# Patient Record
Sex: Male | Born: 1945 | Race: White | Hispanic: No | Marital: Married | Smoking: Former smoker
Health system: Southern US, Community
[De-identification: ages and names within clinical notes are randomized; demographics above are authoritative.]

## PROBLEM LIST (undated history)

## (undated) DIAGNOSIS — C801 Malignant (primary) neoplasm, unspecified: Secondary | ICD-10-CM

## (undated) DIAGNOSIS — G4730 Sleep apnea, unspecified: Secondary | ICD-10-CM

## (undated) DIAGNOSIS — J90 Pleural effusion, not elsewhere classified: Secondary | ICD-10-CM

## (undated) DIAGNOSIS — I219 Acute myocardial infarction, unspecified: Secondary | ICD-10-CM

## (undated) DIAGNOSIS — N400 Enlarged prostate without lower urinary tract symptoms: Secondary | ICD-10-CM

## (undated) DIAGNOSIS — M1990 Unspecified osteoarthritis, unspecified site: Secondary | ICD-10-CM

## (undated) DIAGNOSIS — I10 Essential (primary) hypertension: Secondary | ICD-10-CM

## (undated) DIAGNOSIS — K219 Gastro-esophageal reflux disease without esophagitis: Secondary | ICD-10-CM

## (undated) DIAGNOSIS — I4891 Unspecified atrial fibrillation: Secondary | ICD-10-CM

## (undated) DIAGNOSIS — I2510 Atherosclerotic heart disease of native coronary artery without angina pectoris: Secondary | ICD-10-CM

## (undated) HISTORY — PX: OTHER SURGICAL HISTORY: SHX169

---

## 2000-09-09 ENCOUNTER — Ambulatory Visit (HOSPITAL_COMMUNITY): Admission: RE | Admit: 2000-09-09 | Discharge: 2000-09-09 | Payer: Self-pay | Admitting: Internal Medicine

## 2000-09-09 ENCOUNTER — Encounter: Payer: Self-pay | Admitting: Internal Medicine

## 2005-04-14 ENCOUNTER — Other Ambulatory Visit: Admission: RE | Admit: 2005-04-14 | Discharge: 2005-04-14 | Payer: Self-pay | Admitting: Urology

## 2005-06-25 ENCOUNTER — Ambulatory Visit: Payer: Self-pay | Admitting: Internal Medicine

## 2005-06-25 ENCOUNTER — Ambulatory Visit (HOSPITAL_COMMUNITY): Admission: RE | Admit: 2005-06-25 | Discharge: 2005-06-25 | Payer: Self-pay | Admitting: Internal Medicine

## 2006-02-04 ENCOUNTER — Encounter: Payer: Self-pay | Admitting: Pulmonary Disease

## 2006-02-04 ENCOUNTER — Ambulatory Visit (HOSPITAL_COMMUNITY): Admission: RE | Admit: 2006-02-04 | Discharge: 2006-02-04 | Payer: Self-pay | Admitting: Internal Medicine

## 2006-02-17 ENCOUNTER — Ambulatory Visit: Payer: Self-pay | Admitting: Pulmonary Disease

## 2006-05-11 ENCOUNTER — Ambulatory Visit: Payer: Self-pay | Admitting: Pulmonary Disease

## 2006-06-08 ENCOUNTER — Ambulatory Visit: Payer: Self-pay | Admitting: Pulmonary Disease

## 2008-02-20 ENCOUNTER — Ambulatory Visit: Payer: Self-pay | Admitting: Pulmonary Disease

## 2008-02-20 DIAGNOSIS — G4733 Obstructive sleep apnea (adult) (pediatric): Secondary | ICD-10-CM

## 2010-02-26 ENCOUNTER — Ambulatory Visit
Admission: RE | Admit: 2010-02-26 | Discharge: 2010-02-26 | Payer: Self-pay | Source: Home / Self Care | Attending: Pulmonary Disease | Admitting: Pulmonary Disease

## 2010-03-18 NOTE — Assessment & Plan Note (Signed)
Summary: rov for osa   Primary Provider/Referring Provider:  Dr. Carylon Perches  CC:  Follow up appt OSA.  Last seen Jan 2010.  Pt states he would like to discus restarting cpap- states he had previously stopped using because of problems with pressure and chin strap. Marland Kitchen  History of Present Illness: the pt cmes in today for f/u of his moderate osa.  He has been on cpap in the past, but had very poor tolerance due to mask and pressure.  He has continued to be symptomatic, and would like to consider restarting cpap or looking at alternatives.  Current Medications (verified): 1)  Finasteride 5 Mg Tabs (Finasteride) .... Take 1 Tablet By Mouth Once A Day  Allergies (verified): No Known Drug Allergies  Review of Systems       The patient complains of headaches and nasal congestion/difficulty breathing through nose.  The patient denies shortness of breath with activity, shortness of breath at rest, productive cough, non-productive cough, coughing up blood, chest pain, irregular heartbeats, acid heartburn, indigestion, loss of appetite, weight change, abdominal pain, difficulty swallowing, sore throat, tooth/dental problems, sneezing, itching, ear ache, anxiety, depression, hand/feet swelling, joint stiffness or pain, rash, change in color of mucus, and fever.    Vital Signs:  Patient profile:   65 year old male Height:      70 inches Weight:      194.25 pounds BMI:     27.97 O2 Sat:      96 % on Room air Temp:     98.2 degrees F oral Pulse rate:   86 / minute BP sitting:   130 / 68  (left arm) Cuff size:   regular  Vitals Entered By: Arman Filter LPN (February 26, 2010 1:42 PM)  O2 Flow:  Room air CC: Follow up appt OSA.  Last seen Jan 2010.  Pt states he would like to discus restarting cpap- states he had previously stopped using because of problems with pressure and chin strap.  Comments Medications reviewed with patient Arman Filter LPN  February 26, 2010 1:42 PM    Physical  Exam  General:  wd male in nad Nose:  no skin breakdown or pressure necrosis from cpap mask. Extremities:  no edema or cyanosis  Neurologic:  alert and oriented, moves all 4.   Impression & Recommendations:  Problem # 1:  OBSTRUCTIVE SLEEP APNEA (ICD-327.23) the pt has known moderate osa, but has very poor tolerance of cpap.  He would consider trying cpap again, but is interested in other alternatives.  I have discussed dental appliance and surgery with him, but suspect the former is his best option outside of cpap.  The pt is interested in a dental referral for consultation.    Medications Added to Medication List This Visit: 1)  Finasteride 5 Mg Tabs (Finasteride) .... Take 1 tablet by mouth once a day  Other Orders: Est. Patient Level III (47829) Dental Referral (Dentist)  Patient Instructions: 1)  will refer you to Dr. Myrtis Ser for possible dental appliance. 2)  work on weight loss

## 2010-06-26 NOTE — Assessment & Plan Note (Signed)
[facility]                             PULMONARY OFFICE NOTE   Chase Guerrero, Chase Guerrero                         MRN:          [id number]  DATE:05/11/2006                            DOB:          05/19/1944    HISTORY OF PRESENT ILLNESS:  The patient is a very pleasant 65 year old  gentleman who I have been asked to see for obstructive sleep apnea.  The  patient underwent nocturnal polysomnography in December of last year and  was found to have moderate sleep apnea with respiratory disturbance  index of 32 events per hour and desaturation to 82%.  The patient states  he has been told that he has loud snoring and this will often awaken  him.  He has also been noted to have pauses in his breathing during  sleep according to his wife.  The patient typically gets to bed at 9:30  and gets up at 5:30 to start his day.  He is not rested upon arising.  He works as a Engineer, maintenance (IT), and states that he will doze with  periods of inactivity during the day, and that his alertness level is  not where he would like it to be.  He has no difficulties with driving.  Of note, his weight is only up about 3 pounds over the last 2 years.   PAST MEDICAL HISTORY:  Significant for allergic disease and knee surgery  in the past, otherwise noncontributory.   CURRENT MEDICATIONS:  None.  He does take various p.r.n.   He has no known drug allergies.   SOCIAL HISTORY:  He is married and has children.  He has a history of  smoking 1/2 to 3/4 of a pack per day for 10-15 years.  He has not smoked  in 35 years.   FAMILY HISTORY:  Unremarkable in 1st degree relatives.   REVIEW OF SYSTEMS:  As per history of present illness, also see patient  intake form documented in the chart.   PHYSICAL EXAMINATION:  IN GENERAL:  He is a well-developed white male in  no acute distress.  Blood pressure is 118/64, pulse 84, temperature 98.1, weight is 185  pounds, he is 6 feet tall.  O2  saturation on room air is 96%.  HEENT:  Pupils are equal, round, and reactive to light and  accommodation.  Extraocular muscles are intact.  Nares are somewhat  narrowed but patent.  Oropharynx with moderate elongation of the soft  palate and uvula.  NECK:  Supple without JVD or lymphadenopathy.  There is no palpable  thyromegaly.  CHEST:  Totally clear.  CARDIAC EXAM:  Reveals regular rate and rhythm, no murmurs, rubs, or  gallops.  ABDOMEN:  Soft, nontender with good bowel sounds.  GENITAL/RECTAL/BREAST EXAM:  Not done and not indicated.  LOWER EXTREMITIES:  Without edema.  Pulses are intact distally.  NEUROLOGICALLY:  Alert and oriented with no obvious observable motor  defects.   IMPRESSION:  Moderate obstructive sleep apnea by nocturnal  polysomnogram.  The patient is also symptomatic during the day and has  nonrestorative sleep.  I had a long discussion with him about sleep  apnea and why it is important to treat this degree of severity.   PLAN:  1. A 5-10 pound weight loss.  2. I have discussed with him continuous positive airway pressure and      he is interested in trying this.  Will start at 10 cm and      ultimately we will have to optimize pressure for him.  The patient      will followup in 4 weeks, or sooner if there are problems.     Barbaraann Share, MD,FCCP  Electronically Signed    [facility]/MedQ  DD: 06/08/2006  DT: 06/08/2006  Job #: [id number]   cc:   Kingsley Callander. Ouida Sills, MD

## 2010-06-26 NOTE — Procedures (Signed)
NAME:  Chase Guerrero, Chase Guerrero NO.:  [id number]   MEDICAL RECORD NO.:  [id number]          PATIENT TYPE:  OUT   LOCATION:  SLEEP LAB                     FACILITY:  [facility]   PHYSICIAN:  Barbaraann Share, MD,FCCPDATE OF BIRTH:  05/19/1944   DATE OF STUDY:  02/04/2006                            NOCTURNAL POLYSOMNOGRAM   REFERRING PHYSICIAN:  Carylon Perches MD   INDICATIONS:  Hypersomnia with sleep apnea.   EPWORTH SCORE:  11.   SLEEP ARCHITECTURE:  The patient had total sleep time of 357 minutes  with decreased REM and never achieved slow wave sleep.  Sleep onset  latency was normal and REM onset was normal as well.  Sleep efficiency  was 86%.   RESPIRATORY/DATA:  The patient was found to have 124 hypopnea's and 63  obstructive apneas and one central apneas for a respiratory disturbance  index of 32 events per hour.  Events were not positional, but there was  loud to very loud snoring noted throughout.   OXYGEN DATA:  Was O2 desaturation as low as 82% with the patient's  obstructive events.   CARDIAC DATA:  No clinically significant cardiac arrhythmias,  movements/parasomnia:  There were no significant leg jerks, nor abnormal  behaviors noted.   IMPRESSION/RECOMMENDATIONS:  1. Moderate obstructive sleep apnea/hypopnea syndrome with a      respiratory disturbance index of 32 events per hour and O2      desaturation as low as 82%.  Treatment for this degree of sleep      apnea should focus primarily on weight loss as well as CPAP.      However, under certain circumstances, upper airway surgery or oral      appliance could be considered.  Clinical correlation is suggested.      Barbaraann Share, MD,FCCP  Diplomate, American Board of Sleep  Medicine     KMC/MEDQ  D:  02/17/2006 17:37:41  T:  02/18/2006 59:56:38  Job:  [id number]

## 2010-06-26 NOTE — Op Note (Signed)
NAME:  Chase Guerrero, Chase Guerrero                ACCOUNT NO.:  [id number]   MEDICAL RECORD NO.:  [id number]          PATIENT TYPE:  AMB   LOCATION:  DAY                           FACILITY:  [facility]   PHYSICIAN:  R. Roetta Sessions, M.D. DATE OF BIRTH:  1945/10/16   DATE OF PROCEDURE:  06/25/2005  DATE OF DISCHARGE:                                 OPERATIVE REPORT   PROCEDURE:  Screening colonoscopy.   ENDOSCOPIST:  Jonathon Bellows, M.D.   INDICATIONS FOR PROCEDURE:  The patient is a 65 year old gentleman sent over  at the courtesy of Dr. Kingsley Callander. [place] for cancer screening.  He has no lower  GI tract symptoms.  There is no family history of colorectal neoplasia.  He  has never had his entire lower GI tract evaluated, although he apparently  had a sigmoidoscopy by Dr. Kingsley Callander. Ouida Sills several years ago, which was  unrevealing.  A colonoscopy is now being done.  I discussed at length with  the patient the potential risks, benefits and alternatives which have been  reviewed and any questions answered.  He is agreeable.  Please see the  documentation in the medical record.   DESCRIPTION OF PROCEDURE:  O2 saturation, blood pressure pulse and  respirations were monitored throughout the entire procedure.  Conscious  sedation with Versed 3 mg IV and Demerol 75 mg IV in divided dose.  The  instrument is the Olympus videoscope system.   FINDINGS:  A digital rectal exam revealed no abnormalities.  Endoscopic  findings revealed that the prep was excellent.   RECTUM:  Examination of the rectal mucosa including retroflexion view of the  anal verge revealed no abnormalities.   COLON:  The colonic mucosa was surveyed from the rectosigmoid junction to  the left, transverse and the right colon to the area of the appendiceal  orifice, the ileocecal valve and the cecum.  These structures were well-seen  and photographed for the record.  The Olympus videoscope was slowly and  cautiously withdrawn, and all  previously-mentioned mucosal surfaces were  again seen.  The colonic mucosa appeared normal.   The patient tolerated the procedure well and was reactive in endoscopy.   IMPRESSION:  1.  Normal rectum.  2.  Normal colon.   RECOMMENDATIONS:  Repeat screening colonoscopy in 10 years.      Jonathon Bellows, M.D.  Electronically Signed     RMR/MEDQ  D:  06/25/2005  T:  06/25/2005  Job:  [id number]   cc:   Kingsley Callander. Ouida Sills, MD  Fax: [phone]

## 2010-06-26 NOTE — Assessment & Plan Note (Signed)
[facility]                             PULMONARY OFFICE NOTE   Chase Guerrero, Chase Guerrero                         MRN:          [id number]  DATE:06/08/2006                            DOB:          05/19/1944    SUBJECTIVE:  Chase Guerrero comes in today for follow up after being on  CPAP.  He has brought with him a compliance download that is showing  approximately 18 days greater than 4 hours and 4 days less than 4 hours.  This is 82% of the time greater than 4 hours.  The patient was unable to  wear a nasal mask because of leaks when moving side to side and he  switched over to a nasal pillow with chin straps.  This is working  better for him, but he is having a lot of awakenings during the night  for unknown reason.  If he awakens between 4 and 5 a.m., he is having a  hard time getting back to sleep.  The patient is not using the ramp  feature to try and reinitiate sleep, and I have reminded him of this.   PHYSICAL EXAMINATION:  GENERAL:  He is a well-developed, white male in  no acute distress.  VITAL SIGNS:  Blood pressure 120/70, pulse 97, temperature 97.9, weight  189 pounds, O2 saturation on room air is 97%.  There is no evidence of  skin breakdown and pressure necrosis on the CPAP mask.   IMPRESSION:  Moderate obstructive sleep apnea which is being treated  with CPAP.  The patient is having some adjustment issues, and we will  trouble shoot accordingly.  I have asked him to use the ramp feature if  he awakens during the night and has difficulties getting back to sleep.  I think it is also possible that he may be having breakthrough events  that are waking him up since we have not optimized pressure for him.  We  will go ahead and get an auto-titrate device for two weeks so that we  can see if that is the issue.   PLAN:  1. Continue to wear CPAP at the current setting until the auto-titrate      machine can be delivered for pressure optimization.  I  will let the      patient know the results.  2. Use ramp feature on his current machine during the night if he      awakens and has difficulty getting back to sleep.  3. Follow up in approximately six months or sooner if there are      problems.     Barbaraann Share, MD,FCCP  Electronically Signed    KMC/MedQ  DD: 06/08/2006  DT: 06/08/2006  Job #: [id number]   cc:   Kingsley Callander. Ouida Sills, MD

## 2010-11-25 ENCOUNTER — Ambulatory Visit (HOSPITAL_BASED_OUTPATIENT_CLINIC_OR_DEPARTMENT_OTHER)
Admission: RE | Admit: 2010-11-25 | Discharge: 2010-11-25 | Disposition: A | Discharge status: Home / Self Care | Payer: Worker's Compensation | Source: Ambulatory Visit | Attending: Orthopedic Surgery | Admitting: Orthopedic Surgery

## 2010-11-25 DIAGNOSIS — Y9389 Activity, other specified: Secondary | ICD-10-CM | POA: Insufficient documentation

## 2010-11-25 DIAGNOSIS — W208XXA Other cause of strike by thrown, projected or falling object, initial encounter: Secondary | ICD-10-CM | POA: Insufficient documentation

## 2010-11-25 DIAGNOSIS — Z01812 Encounter for preprocedural laboratory examination: Secondary | ICD-10-CM | POA: Insufficient documentation

## 2010-11-25 DIAGNOSIS — Y990 Civilian activity done for income or pay: Secondary | ICD-10-CM | POA: Insufficient documentation

## 2010-11-25 DIAGNOSIS — IMO0002 Reserved for concepts with insufficient information to code with codable children: Secondary | ICD-10-CM | POA: Insufficient documentation

## 2010-11-25 DIAGNOSIS — Y9269 Other specified industrial and construction area as the place of occurrence of the external cause: Secondary | ICD-10-CM | POA: Insufficient documentation

## 2010-11-25 LAB — POCT HEMOGLOBIN-HEMACUE: Hemoglobin: 14.3 g/dL (ref 13.0–17.0)

## 2010-12-11 NOTE — Op Note (Signed)
  NAME:  Chase, Guerrero NO.:  [id number]  MEDICAL RECORD NO.:  [id number]  LOCATION:                                 FACILITY:  PHYSICIAN:  Cindee Salt, M.D.            DATE OF BIRTH:  DATE OF PROCEDURE: DATE OF DISCHARGE:                              OPERATIVE REPORT   PREOPERATIVE DIAGNOSIS:  Status post crushed injury right index finger with loss of sensation radial digital nerve right index finger.  POSTOPERATIVE DIAGNOSIS:  Status post crushed injury right index finger with loss of sensation radial digital nerve right index finger.  OPERATION:  Exploration neurolysis of scar with operative microscope radial digital nerve, right index finger.  SURGEON:  Cindee Salt, MD  ANESTHESIA:  Supraclavicular block general anesthesia.  ANESTHESIOLOGIST:  Janetta Hora. Gelene Mink, MD  HISTORY:  The patient is a 65 year old male with a history of a crush injury to his right index finger.  He has not had any return of sensation.  The injury occurred with a 900 pound casket vault top when it landed on his hand.  He is admitted now for exploration, possible repair of conduit, neurolysis radial digital nerve.  He is aware that there is no guarantee with the surgery, possibility of infection, recurrence injury to arteries, nerves, tendons, incomplete relief of symptoms and dystrophy.  Preoperative area, the patient is seen.  The extremity marked by both the patient and surgeon.  Antibiotic given.  Procedure: The patient wa sbrougfht to the operating room, preped and draped using Chloropre. 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 Using the operative microscope the neuroma was resected and an epinueral repair performed to the radial digital nerve. The wound was irrigated with saline and closed with 4-0 vicral rapide suture. The wound was injected with  .25 % marcaine without epinepherinee using 4 cc. A sr terile compressive dressing and splint applied. On deflation of the tourniquet all fingers pinked and he was taken to the recovery room in stable condition.  He will be discharged to home to return in one week on Vicodin.          ______________________________ Cindee Salt, M.D.     GK/MEDQ  D:  11/25/2010  T:  11/26/2010  Job:  [id number]  Electronically Signed by Cindee Salt M.D. on 12/11/2010 03:40:26 PM

## 2011-04-27 DIAGNOSIS — C4441 Basal cell carcinoma of skin of scalp and neck: Secondary | ICD-10-CM | POA: Insufficient documentation

## 2013-04-16 DIAGNOSIS — E785 Hyperlipidemia, unspecified: Secondary | ICD-10-CM | POA: Insufficient documentation

## 2013-04-16 DIAGNOSIS — M51369 Other intervertebral disc degeneration, lumbar region without mention of lumbar back pain or lower extremity pain: Secondary | ICD-10-CM | POA: Insufficient documentation

## 2013-05-23 ENCOUNTER — Other Ambulatory Visit (HOSPITAL_COMMUNITY): Payer: Self-pay | Admitting: Orthopedic Surgery

## 2013-05-23 DIAGNOSIS — M25512 Pain in left shoulder: Secondary | ICD-10-CM

## 2013-05-25 ENCOUNTER — Ambulatory Visit (HOSPITAL_COMMUNITY)
Admission: RE | Admit: 2013-05-25 | Discharge: 2013-05-25 | Disposition: A | Discharge status: Home / Self Care | Payer: Medicare Other | Source: Ambulatory Visit | Attending: Orthopedic Surgery | Admitting: Orthopedic Surgery

## 2013-05-25 DIAGNOSIS — M25512 Pain in left shoulder: Secondary | ICD-10-CM

## 2013-05-25 DIAGNOSIS — M25519 Pain in unspecified shoulder: Secondary | ICD-10-CM | POA: Insufficient documentation

## 2013-05-25 DIAGNOSIS — R937 Abnormal findings on diagnostic imaging of other parts of musculoskeletal system: Secondary | ICD-10-CM | POA: Insufficient documentation

## 2014-05-07 DIAGNOSIS — R399 Unspecified symptoms and signs involving the genitourinary system: Secondary | ICD-10-CM | POA: Insufficient documentation

## 2014-07-11 ENCOUNTER — Ambulatory Visit (INDEPENDENT_AMBULATORY_CARE_PROVIDER_SITE_OTHER): Payer: Medicare Other | Admitting: Otolaryngology

## 2014-07-11 DIAGNOSIS — H903 Sensorineural hearing loss, bilateral: Secondary | ICD-10-CM

## 2014-07-11 DIAGNOSIS — H8112 Benign paroxysmal vertigo, left ear: Secondary | ICD-10-CM | POA: Diagnosis not present

## 2014-07-11 DIAGNOSIS — R42 Dizziness and giddiness: Secondary | ICD-10-CM

## 2014-08-08 ENCOUNTER — Ambulatory Visit (INDEPENDENT_AMBULATORY_CARE_PROVIDER_SITE_OTHER): Payer: Self-pay | Admitting: Otolaryngology

## 2015-04-04 DIAGNOSIS — M9901 Segmental and somatic dysfunction of cervical region: Secondary | ICD-10-CM | POA: Diagnosis not present

## 2015-04-04 DIAGNOSIS — S338XXA Sprain of other parts of lumbar spine and pelvis, initial encounter: Secondary | ICD-10-CM | POA: Diagnosis not present

## 2015-04-04 DIAGNOSIS — M47812 Spondylosis without myelopathy or radiculopathy, cervical region: Secondary | ICD-10-CM | POA: Diagnosis not present

## 2015-04-04 DIAGNOSIS — M9903 Segmental and somatic dysfunction of lumbar region: Secondary | ICD-10-CM | POA: Diagnosis not present

## 2015-04-04 DIAGNOSIS — M546 Pain in thoracic spine: Secondary | ICD-10-CM | POA: Diagnosis not present

## 2015-04-04 DIAGNOSIS — M9902 Segmental and somatic dysfunction of thoracic region: Secondary | ICD-10-CM | POA: Diagnosis not present

## 2015-04-25 DIAGNOSIS — G4730 Sleep apnea, unspecified: Secondary | ICD-10-CM | POA: Diagnosis not present

## 2015-04-25 DIAGNOSIS — N400 Enlarged prostate without lower urinary tract symptoms: Secondary | ICD-10-CM | POA: Diagnosis not present

## 2015-04-25 DIAGNOSIS — R7301 Impaired fasting glucose: Secondary | ICD-10-CM | POA: Diagnosis not present

## 2015-04-25 DIAGNOSIS — Z125 Encounter for screening for malignant neoplasm of prostate: Secondary | ICD-10-CM | POA: Diagnosis not present

## 2015-04-25 DIAGNOSIS — Z79899 Other long term (current) drug therapy: Secondary | ICD-10-CM | POA: Diagnosis not present

## 2015-04-25 DIAGNOSIS — E785 Hyperlipidemia, unspecified: Secondary | ICD-10-CM | POA: Diagnosis not present

## 2015-05-02 DIAGNOSIS — E785 Hyperlipidemia, unspecified: Secondary | ICD-10-CM | POA: Diagnosis not present

## 2015-05-02 DIAGNOSIS — Z0001 Encounter for general adult medical examination with abnormal findings: Secondary | ICD-10-CM | POA: Diagnosis not present

## 2015-05-02 DIAGNOSIS — Z23 Encounter for immunization: Secondary | ICD-10-CM | POA: Diagnosis not present

## 2015-05-02 DIAGNOSIS — Z6828 Body mass index (BMI) 28.0-28.9, adult: Secondary | ICD-10-CM | POA: Diagnosis not present

## 2015-05-02 DIAGNOSIS — N400 Enlarged prostate without lower urinary tract symptoms: Secondary | ICD-10-CM | POA: Diagnosis not present

## 2015-05-08 DIAGNOSIS — N400 Enlarged prostate without lower urinary tract symptoms: Secondary | ICD-10-CM | POA: Diagnosis not present

## 2015-05-27 ENCOUNTER — Telehealth: Payer: Self-pay

## 2015-05-27 ENCOUNTER — Telehealth: Payer: Self-pay | Admitting: Internal Medicine

## 2015-05-27 NOTE — Telephone Encounter (Signed)
[phone]  PATIENT RECEIVED LETTER TO SCHEDULE TCS

## 2015-05-27 NOTE — Telephone Encounter (Signed)
RECALL FOR TCS °

## 2015-05-30 DIAGNOSIS — M47816 Spondylosis without myelopathy or radiculopathy, lumbar region: Secondary | ICD-10-CM | POA: Diagnosis not present

## 2015-05-30 DIAGNOSIS — M546 Pain in thoracic spine: Secondary | ICD-10-CM | POA: Diagnosis not present

## 2015-05-30 DIAGNOSIS — M9902 Segmental and somatic dysfunction of thoracic region: Secondary | ICD-10-CM | POA: Diagnosis not present

## 2015-05-30 DIAGNOSIS — R42 Dizziness and giddiness: Secondary | ICD-10-CM | POA: Diagnosis not present

## 2015-05-30 DIAGNOSIS — M9901 Segmental and somatic dysfunction of cervical region: Secondary | ICD-10-CM | POA: Diagnosis not present

## 2015-05-30 DIAGNOSIS — S338XXA Sprain of other parts of lumbar spine and pelvis, initial encounter: Secondary | ICD-10-CM | POA: Diagnosis not present

## 2015-05-30 DIAGNOSIS — M47812 Spondylosis without myelopathy or radiculopathy, cervical region: Secondary | ICD-10-CM | POA: Diagnosis not present

## 2015-05-30 DIAGNOSIS — M9903 Segmental and somatic dysfunction of lumbar region: Secondary | ICD-10-CM | POA: Diagnosis not present

## 2015-06-02 NOTE — Telephone Encounter (Signed)
Mailed letter °

## 2015-06-03 ENCOUNTER — Telehealth: Payer: Self-pay

## 2015-06-03 DIAGNOSIS — G4733 Obstructive sleep apnea (adult) (pediatric): Secondary | ICD-10-CM

## 2015-06-03 NOTE — Telephone Encounter (Signed)
I triaged pt and waiting on previous report from [facility].

## 2015-06-04 NOTE — Telephone Encounter (Signed)
Pt was on our recall. I have triaged and waiting for [facility] Records to send me copy of previous report that I could not find in epic.

## 2015-06-04 NOTE — Telephone Encounter (Signed)
Received the previous report from 06/25/2005

## 2015-06-13 DIAGNOSIS — M9901 Segmental and somatic dysfunction of cervical region: Secondary | ICD-10-CM | POA: Diagnosis not present

## 2015-06-13 DIAGNOSIS — M9903 Segmental and somatic dysfunction of lumbar region: Secondary | ICD-10-CM | POA: Diagnosis not present

## 2015-06-13 DIAGNOSIS — M47812 Spondylosis without myelopathy or radiculopathy, cervical region: Secondary | ICD-10-CM | POA: Diagnosis not present

## 2015-06-13 DIAGNOSIS — M546 Pain in thoracic spine: Secondary | ICD-10-CM | POA: Diagnosis not present

## 2015-06-13 DIAGNOSIS — M9902 Segmental and somatic dysfunction of thoracic region: Secondary | ICD-10-CM | POA: Diagnosis not present

## 2015-06-13 DIAGNOSIS — R42 Dizziness and giddiness: Secondary | ICD-10-CM | POA: Diagnosis not present

## 2015-06-13 DIAGNOSIS — S338XXA Sprain of other parts of lumbar spine and pelvis, initial encounter: Secondary | ICD-10-CM | POA: Diagnosis not present

## 2015-06-13 DIAGNOSIS — M47816 Spondylosis without myelopathy or radiculopathy, lumbar region: Secondary | ICD-10-CM | POA: Diagnosis not present

## 2015-06-17 NOTE — Telephone Encounter (Signed)
Dr Ron Parker requests order to replace existing oral appliance for obstructive sleep apnea. Patient was last seen at our office 03/11/10 by Dr Gwenette Greet, who no longer works here. Ok to order to Dr Katz/ Orthodontist    "Replacement for worn-out oral appliance needed to treat obstructive sleep apnea"

## 2015-06-17 NOTE — Addendum Note (Signed)
Addended by: Mathis Dad on: 06/17/2015 04:43 PM   Modules accepted: Orders

## 2015-06-17 NOTE — Telephone Encounter (Signed)
Order entered for Dental [facility]liance. Patient aware. Nothing further needed.

## 2015-06-19 NOTE — Telephone Encounter (Signed)
Gastroenterology Pre-Procedure Review  Request Date: 05/27/2015 Requesting Physician: ON RECALL  PATIENT REVIEW QUESTIONS: The patient responded to the following health history questions as indicated:    PT'S LAST COLONOSCOPY WAS DONE ON 06/25/2005 BY DR. Gala Romney.   1. Diabetes Melitis: no 2. Joint replacements in the past 12 months: no 3. Major health problems in the past 3 months: no 4. Has an artificial valve or MVP: no 5. Has a defibrillator: no 6. Has been advised in past to take antibiotics in advance of a procedure like teeth cleaning: no 7. Family history of colon cancer: no  8. Alcohol Use: no 9. History of sleep apnea: yes , BUT NEVER USED C PAP/ HAS ANOTHER APPT SOON    MEDICATIONS & ALLERGIES:    Patient reports the following regarding taking any blood thinners:   Plavix? no Aspirin? no Coumadin? no  Patient confirms/reports the following medications:  Current Outpatient Prescriptions  Medication Sig Dispense Refill  . finasteride (PROSCAR) 5 MG tablet Take 5 mg by mouth daily.     No current facility-administered medications for this visit.    Patient confirms/reports the following allergies:  No Known Allergies  No orders of the defined types were placed in this encounter.    AUTHORIZATION INFORMATION Primary Insurance:   ID #:   Group #:  Pre-Cert / Auth required:  Pre-Cert / Auth #:   Secondary Insurance:   ID #:  Group #:  Pre-Cert / Auth required:  Pre-Cert / Auth #:   SCHEDULE INFORMATION: Procedure has been scheduled as follows:  Date:                    Time:   Location:   This Gastroenterology Pre-Precedure Review Form is being routed to the following provider(s): R. Garfield Cornea, MD

## 2015-06-23 ENCOUNTER — Telehealth: Payer: Self-pay

## 2015-06-23 NOTE — Telephone Encounter (Signed)
Pt had received a letter that it was time to schedule his colonoscopy. I told him DS does triages between 130-430 and I would let her know that he had called. He can be reached on his cell [phone]

## 2015-06-23 NOTE — Telephone Encounter (Signed)
Ok to schedule.

## 2015-06-24 NOTE — Telephone Encounter (Signed)
See separate triage.  

## 2015-07-08 NOTE — Telephone Encounter (Signed)
Chase Guerrero is scheduled for 07/23/2015 at 10:30 Am with Dr. Gala Romney.  He said there has been no change in his meds since he was triaged.

## 2015-07-10 ENCOUNTER — Other Ambulatory Visit: Payer: Self-pay

## 2015-07-10 DIAGNOSIS — Z1211 Encounter for screening for malignant neoplasm of colon: Secondary | ICD-10-CM

## 2015-07-10 MED ORDER — PEG 3350-KCL-NA BICARB-NACL 420 G PO SOLR: 4000.0000 mL | ORAL | Status: DC

## 2015-07-10 NOTE — Telephone Encounter (Signed)
Rx sent to the pharmacy and instructions mailed to pt.  

## 2015-07-10 NOTE — Addendum Note (Signed)
Addended by: Everardo All on: 07/10/2015 11:10 AM   Modules accepted: Orders

## 2015-07-11 ENCOUNTER — Telehealth: Payer: Self-pay

## 2015-07-11 NOTE — Telephone Encounter (Signed)
I called [employer] at [phone]. Code [id number] does not need a PA as out patient.  Automation.

## 2015-07-23 ENCOUNTER — Ambulatory Visit (HOSPITAL_COMMUNITY)
Admission: RE | Admit: 2015-07-23 | Discharge: 2015-07-23 | Disposition: A | Discharge status: Home / Self Care | Payer: PPO | Source: Ambulatory Visit | Attending: Internal Medicine | Admitting: Internal Medicine

## 2015-07-23 ENCOUNTER — Encounter (HOSPITAL_COMMUNITY): Payer: Self-pay | Admitting: *Deleted

## 2015-07-23 ENCOUNTER — Encounter (HOSPITAL_COMMUNITY)
Admission: RE | Disposition: A | Discharge status: Home / Self Care | Payer: Self-pay | Source: Ambulatory Visit | Attending: Internal Medicine

## 2015-07-23 DIAGNOSIS — Z79899 Other long term (current) drug therapy: Secondary | ICD-10-CM | POA: Insufficient documentation

## 2015-07-23 DIAGNOSIS — D123 Benign neoplasm of transverse colon: Secondary | ICD-10-CM | POA: Insufficient documentation

## 2015-07-23 DIAGNOSIS — N400 Enlarged prostate without lower urinary tract symptoms: Secondary | ICD-10-CM | POA: Diagnosis not present

## 2015-07-23 DIAGNOSIS — Z86010 Personal history of colonic polyps: Secondary | ICD-10-CM | POA: Insufficient documentation

## 2015-07-23 DIAGNOSIS — K5730 Diverticulosis of large intestine without perforation or abscess without bleeding: Secondary | ICD-10-CM | POA: Diagnosis not present

## 2015-07-23 DIAGNOSIS — Z1211 Encounter for screening for malignant neoplasm of colon: Secondary | ICD-10-CM | POA: Insufficient documentation

## 2015-07-23 DIAGNOSIS — Z87891 Personal history of nicotine dependence: Secondary | ICD-10-CM | POA: Insufficient documentation

## 2015-07-23 HISTORY — DX: Benign prostatic hyperplasia without lower urinary tract symptoms: N40.0

## 2015-07-23 HISTORY — PX: COLONOSCOPY: SHX5424

## 2015-07-23 SURGERY — COLONOSCOPY
Anesthesia: Moderate Sedation

## 2015-07-23 MED ORDER — ONDANSETRON HCL 4 MG/2ML IJ SOLN
INTRAMUSCULAR | Status: AC
  Filled 2015-07-23: qty 2

## 2015-07-23 MED ORDER — MEPERIDINE HCL 100 MG/ML IJ SOLN
INTRAMUSCULAR | Status: DC | PRN
  Administered 2015-07-23: 50 mg via INTRAVENOUS
  Administered 2015-07-23: 25 mg via INTRAVENOUS

## 2015-07-23 MED ORDER — ONDANSETRON HCL 4 MG/2ML IJ SOLN
INTRAMUSCULAR | Status: DC | PRN
  Administered 2015-07-23: 4 mg via INTRAVENOUS

## 2015-07-23 MED ORDER — MIDAZOLAM HCL 5 MG/5ML IJ SOLN
INTRAMUSCULAR | Status: AC
  Filled 2015-07-23: qty 5

## 2015-07-23 MED ORDER — SODIUM CHLORIDE 0.9 % IV SOLN
INTRAVENOUS | Status: DC
  Administered 2015-07-23: 10:00:00 via INTRAVENOUS

## 2015-07-23 MED ORDER — MIDAZOLAM HCL 5 MG/5ML IJ SOLN
INTRAMUSCULAR | Status: AC
  Filled 2015-07-23: qty 10

## 2015-07-23 MED ORDER — MEPERIDINE HCL 100 MG/ML IJ SOLN
INTRAMUSCULAR | Status: AC
  Filled 2015-07-23: qty 2

## 2015-07-23 MED ORDER — STERILE WATER FOR IRRIGATION IR SOLN
Status: DC | PRN
  Administered 2015-07-23: 10:00:00

## 2015-07-23 MED ORDER — MIDAZOLAM HCL 5 MG/5ML IJ SOLN
INTRAMUSCULAR | Status: DC | PRN
  Administered 2015-07-23 (×2): 1 mg via INTRAVENOUS
  Administered 2015-07-23: 2 mg via INTRAVENOUS
  Administered 2015-07-23 (×2): 1 mg via INTRAVENOUS

## 2015-07-23 NOTE — H&P (Signed)
@  [id number]   Primary Care Physician:  [name], MD Primary Gastroenterologist:  Dr. Gala Romney  Pre-Procedure History & Physical: HPI:  Chase Guerrero is a 70 y.o. male is here for a screening colonoscopy. No bowel symptoms. No family history of colon cancer. Negative coloscopy 10 years ago.  Past Medical History  Diagnosis Date  . Enlarged prostate     Past Surgical History  Procedure Laterality Date  . Right hand surgery    . Right knee arthroscopy      Prior to Admission medications   Medication Sig Start Date End Date Taking? Authorizing Provider  finasteride (PROSCAR) 5 MG tablet Take 5 mg by mouth daily.   Yes Historical Provider, MD  polyethylene glycol-electrolytes (TRILYTE) 420 g solution Take 4,000 mLs by mouth as directed. 07/10/15  Yes Daneil Dolin, MD    Allergies as of 07/10/2015  . (No Known Allergies)    History reviewed. No pertinent family history.  Social History   Social History  . Marital Status: Married    Spouse Name: N/A  . Number of Children: N/A  . Years of Education: N/A   Occupational History  . Not on file.   Social History Main Topics  . Smoking status: Former Smoker -- 0.50 packs/day for 20 years    Types: Cigarettes  . Smokeless tobacco: Not on file  . Alcohol Use: No  . Drug Use: No  . Sexual Activity: Not on file   Other Topics Concern  . Not on file   Social History Narrative  . No narrative on file    Review of Systems: See HPI, otherwise negative ROS  Physical Exam: BP 150/74 mmHg  Pulse 77  Temp(Src) 97.3 F (36.3 C) (Oral)  Resp 14  Ht 5\' 10"  (1.778 m)  Wt 198 lb (89.812 kg)  BMI 28.41 kg/m2  SpO2 95% General:   Alert,  Well-developed, well-nourished, pleasant and cooperative in NAD Head:  Normocephalic and atraumatic. Lungs:  Clear throughout to auscultation.   No wheezes, crackles, or rhonchi. No acute distress. Heart:  Regular rate and rhythm; no murmurs, clicks, rubs,  or gallops. Abdomen:  Soft, nontender  and nondistended. No masses, hepatosplenomegaly or hernias noted. Normal bowel sounds, without guarding, and without rebound.   Msk:  Symmetrical without gross deformities. Normal posture. Pulses:  Normal pulses noted. Extremities:  Without clubbing or edema.  Impression/Plan: Chase Guerrero is now here to undergo a screening colonoscopy.   Average risk screening examination.  Risks, benefits, limitations, imponderables and alternatives regarding colonoscopy have been reviewed with the patient. Questions have been answered. All parties agreeable.     Notice:  This dictation was prepared with Dragon dictation along with smaller phrase technology. Any transcriptional errors that result from this process are unintentional and may not be corrected upon review.

## 2015-07-23 NOTE — Discharge Instructions (Signed)
Colonoscopy Discharge Instructions  Read the instructions outlined below and refer to this sheet in the next few weeks. These discharge instructions provide you with general information on caring for yourself after you leave the hospital. Your doctor may also give you specific instructions. While your treatment has been planned according to the most current medical practices available, unavoidable complications occasionally occur. If you have any problems or questions after discharge, call Dr. Gala Romney at [phone]. ACTIVITY  You may resume your regular activity, but move at a slower pace for the next 24 hours.   Take frequent rest periods for the next 24 hours.   Walking will help get rid of the air and reduce the bloated feeling in your belly (abdomen).   No driving for 24 hours (because of the medicine (anesthesia) used during the test).    Do not sign any important legal documents or operate any machinery for 24 hours (because of the anesthesia used during the test).  NUTRITION  Drink plenty of fluids.   You may resume your normal diet as instructed by your doctor.   Begin with a light meal and progress to your normal diet. Heavy or fried foods are harder to digest and may make you feel sick to your stomach (nauseated).   Avoid alcoholic beverages for 24 hours or as instructed.  MEDICATIONS  You may resume your normal medications unless your doctor tells you otherwise.  WHAT YOU CAN EXPECT TODAY  Some feelings of bloating in the abdomen.   Passage of more gas than usual.   Spotting of blood in your stool or on the toilet paper.  IF YOU HAD POLYPS REMOVED DURING THE COLONOSCOPY:  No aspirin products for 7 days or as instructed.   No alcohol for 7 days or as instructed.   Eat a soft diet for the next 24 hours.  FINDING OUT THE RESULTS OF YOUR TEST Not all test results are available during your visit. If your test results are not back during the visit, make an appointment  with your caregiver to find out the results. Do not assume everything is normal if you have not heard from your caregiver or the medical facility. It is important for you to follow up on all of your test results.  SEEK IMMEDIATE MEDICAL ATTENTION IF:  You have more than a spotting of blood in your stool.   Your belly is swollen (abdominal distention).   You are nauseated or vomiting.   You have a temperature over 101.   You have abdominal pain or discomfort that is severe or gets worse throughout the day.   Colon Polyp and diverticulosis information provided   Further recommendations to follow pending review of pathology report  Diverticulosis Diverticulosis is the condition that develops when small pouches (diverticula) form in the wall of your colon. Your colon, or large intestine, is where water is absorbed and stool is formed. The pouches form when the inside layer of your colon pushes through weak spots in the outer layers of your colon. CAUSES  No one knows exactly what causes diverticulosis. RISK FACTORS  Being older than 44. Your risk for this condition increases with age. Diverticulosis is rare in people younger than 40 years. By age 44, almost everyone has it.  Eating a low-fiber diet.  Being frequently constipated.  Being overweight.  Not getting enough exercise.  Smoking.  Taking over-the-counter pain medicines, like aspirin and ibuprofen. SYMPTOMS  Most people with diverticulosis do not have symptoms. DIAGNOSIS  Because diverticulosis often has no symptoms, health care providers often discover the condition during an exam for other colon problems. In many cases, a health care provider will diagnose diverticulosis while using a flexible scope to examine the colon (colonoscopy). TREATMENT  If you have never developed an infection related to diverticulosis, you may not need treatment. If you have had an infection before, treatment may include:  Eating more fruits,  vegetables, and grains.  Taking a fiber supplement.  Taking a live bacteria supplement (probiotic).  Taking medicine to relax your colon. HOME CARE INSTRUCTIONS   Drink at least 6-8 glasses of water each day to prevent constipation.  Try not to strain when you have a bowel movement.  Keep all follow-up appointments. If you have had an infection before:  Increase the fiber in your diet as directed by your health care provider or dietitian.  Take a dietary fiber supplement if your health care provider approves.  Only take medicines as directed by your health care provider. SEEK MEDICAL CARE IF:   You have abdominal pain.  You have bloating.  You have cramps.  You have not gone to the bathroom in 3 days. SEEK IMMEDIATE MEDICAL CARE IF:   Your pain gets worse.  Yourbloating becomes very bad.  You have a fever or chills, and your symptoms suddenly get worse.  You begin vomiting.  You have bowel movements that are bloody or black. MAKE SURE YOU:  Understand these instructions.  Will watch your condition.  Will get help right away if you are not doing well or get worse.   This information is not intended to replace advice given to you by your health care provider. Make sure you discuss any questions you have with your health care provider.   Document Released: 10/23/2003 Document Revised: 01/30/2013 Document Reviewed: 12/20/2012 Elsevier Interactive Patient Education 2016 [employer]  Colon Polyps Polyps are lumps of extra tissue growing inside the body. Polyps can grow in the large intestine (colon). Most colon polyps are noncancerous (benign). However, some colon polyps can become cancerous over time. Polyps that are larger than a pea may be harmful. To be safe, caregivers remove and test all polyps. CAUSES  Polyps form when mutations in the genes cause your cells to grow and divide even though no more tissue is needed. RISK FACTORS There are a number of  risk factors that can increase your chances of getting colon polyps. They include:  Being older than 50 years.  Family history of colon polyps or colon cancer.  Long-term colon diseases, such as colitis or Crohn disease.  Being overweight.  Smoking.  Being inactive.  Drinking too much alcohol. SYMPTOMS  Most small polyps do not cause symptoms. If symptoms are present, they may include:  Blood in the stool. The stool may look dark red or black.  Constipation or diarrhea that lasts longer than 1 week. DIAGNOSIS People often do not know they have polyps until their caregiver finds them during a regular checkup. Your caregiver can use 4 tests to check for polyps:  Digital rectal exam. The caregiver wears gloves and feels inside the rectum. This test would find polyps only in the rectum.  Barium enema. The caregiver puts a liquid called barium into your rectum before taking X-rays of your colon. Barium makes your colon look white. Polyps are dark, so they are easy to see in the X-ray pictures.  Sigmoidoscopy. A thin, flexible tube (sigmoidoscope) is placed into your rectum. The sigmoidoscope has  light and tiny camera in it. The caregiver uses the sigmoidoscope to look at the last third of your colon. °· Colonoscopy. This test is like sigmoidoscopy, but the caregiver looks at the entire colon. This is the most common method for finding and removing polyps. °TREATMENT  °Any polyps will be removed during a sigmoidoscopy or colonoscopy. The polyps are then tested for cancer. °PREVENTION  °To help lower your risk of getting more colon polyps: °· Eat plenty of fruits and vegetables. Avoid eating fatty foods. °· Do not smoke. °· Avoid drinking alcohol. °· Exercise every day. °· Lose weight if recommended by your caregiver. °· Eat plenty of calcium and folate. Foods that are rich in calcium include milk, cheese, and broccoli. Foods that are rich in folate include chickpeas, kidney beans, and  spinach. °HOME CARE INSTRUCTIONS °Keep all follow-up appointments as directed by your caregiver. You may need periodic exams to check for polyps. °SEEK MEDICAL CARE IF: °You notice bleeding during a bowel movement. °  °This information is not intended to replace advice given to you by your health care provider. Make sure you discuss any questions you have with your health care provider. °  °Document Released: 10/22/2003 Document Revised: 02/15/2014 Document Reviewed: 04/06/2011 °Elsevier Interactive Patient Education ©2016 Elsevier Inc. ° °

## 2015-07-23 NOTE — Op Note (Signed)
[facility] Patient Name: Chase Guerrero Procedure Date: 07/23/2015 10:14 AM MRN: [id number] Date of Birth: 1945/11/16 Attending MD: Norvel Richards , MD CSN: [id number] Age: 70 Admit Type: Outpatient Procedure:                Colonoscopy with snare polypectomy Indications:              Screening for colorectal malignant neoplasm Providers:                Norvel Richards, MD, Jeanann Lewandowsky. Gwenlyn Perking RN, RN,                            Georgeann Oppenheim, Technician Referring MD:              Medicines:                Midazolam 6 mg IV, Meperidine 75 mg IV, Ondansetron                            4 mg IV Complications:            No immediate complications. Estimated Blood Loss:     Estimated blood loss was minimal. Procedure:                Pre-Anesthesia Assessment:                           - Prior to the procedure, a History and Physical                            was performed, and patient medications and                            allergies were reviewed. The patient's tolerance of                            previous anesthesia was also reviewed. The risks                            and benefits of the procedure and the sedation                            options and risks were discussed with the patient.                            All questions were answered, and informed consent                            was obtained. Prior Anticoagulants: The patient has                            taken no previous anticoagulant or antiplatelet                            agents. ASA Grade Assessment: II - A patient with  mild systemic disease. After reviewing the risks                            and benefits, the patient was deemed in                            satisfactory condition to undergo the procedure.                           After obtaining informed consent, the colonoscope                            was passed under direct vision. Throughout the                             procedure, the patient's blood pressure, pulse, and                            oxygen saturations were monitored continuously. The                            [id number] scope was introduced through                            the anus and advanced to the the cecum, identified                            by appendiceal orifice and ileocecal valve. The                            colonoscopy was performed without difficulty. The                            patient tolerated the procedure well. The quality                            of the bowel preparation was adequate. The                            ileocecal valve, appendiceal orifice, and rectum                            were photographed. Scope In: 10:27:57 AM Scope Out: 10:41:17 AM Scope Withdrawal Time: 0 hours 8 minutes 37 seconds  Total Procedure Duration: 0 hours 13 minutes 20 seconds  Findings:      The perianal and digital rectal examinations were normal.      A 4 mm polyp was found in the hepatic flexure. The polyp was sessile.       The polyp was removed with a cold snare. Resection and retrieval were       complete. Estimated blood loss was minimal.      Scattered medium-mouthed diverticula were found in the sigmoid colon.       Estimated blood loss was minimal.      No additional abnormalities were found  on retroflexion. Impression:               - One 4 mm polyp at the hepatic flexure, removed                            with a cold snare. Resected and retrieved.                           - Diverticulosis in the sigmoid colon. Moderate Sedation:      Moderate (conscious) sedation was administered by the endoscopy nurse       and supervised by the endoscopist. The following parameters were       monitored: oxygen saturation, heart rate, blood pressure, respiratory       rate, EKG, adequacy of pulmonary ventilation, and response to care.       Total physician intraservice time was 22  minutes. Recommendation:           - Patient has a contact number available for                            emergencies. The signs and symptoms of potential                            delayed complications were discussed with the                            patient. Return to normal activities tomorrow.                            Written discharge instructions were provided to the                            patient.                           - Advance diet as tolerated.                           - Continue present medications.                           - Repeat colonoscopy date to be determined after                            pending pathology results are reviewed for                            surveillance based on pathology results.                           - Return to GI clinic (date not yet determined). Procedure Code(s):        --- Professional ---                           [phone], Colonoscopy, flexible; with removal of  tumor(s), polyp(s), or other lesion(s) by snare                            technique                           99152, Moderate sedation services provided by the                            same physician or other qualified health care                            professional performing the diagnostic or                            therapeutic service that the sedation supports,                            requiring the presence of an independent trained                            observer to assist in the monitoring of the                            patient's level of consciousness and physiological                            status; initial 15 minutes of intraservice time,                            patient age 2 years or older Diagnosis Code(s):        --- Professional ---                           Z12.11, Encounter for screening for malignant                            neoplasm of colon                           D12.3, Benign neoplasm of  transverse colon (hepatic                            flexure or splenic flexure)                           K57.30, Diverticulosis of large intestine without                            perforation or abscess without bleeding CPT copyright 2016 American Medical Association. All rights reserved. The codes documented in this report are preliminary and upon coder review may  be revised to meet current compliance requirements. Cristopher Estimable. Evolet Salminen, MD Norvel Richards, MD 07/23/2015 10:50:11 AM This report has been signed electronically. Number of Addenda: 0

## 2015-07-25 ENCOUNTER — Encounter (HOSPITAL_COMMUNITY): Payer: Self-pay | Admitting: Internal Medicine

## 2015-07-25 ENCOUNTER — Encounter: Payer: Self-pay | Admitting: Internal Medicine

## 2015-08-19 DIAGNOSIS — G4733 Obstructive sleep apnea (adult) (pediatric): Secondary | ICD-10-CM | POA: Diagnosis not present

## 2015-10-22 ENCOUNTER — Other Ambulatory Visit: Payer: Self-pay | Admitting: Physician Assistant

## 2015-10-22 DIAGNOSIS — C44321 Squamous cell carcinoma of skin of nose: Secondary | ICD-10-CM | POA: Diagnosis not present

## 2015-10-22 DIAGNOSIS — C44311 Basal cell carcinoma of skin of nose: Secondary | ICD-10-CM | POA: Diagnosis not present

## 2015-10-22 DIAGNOSIS — L570 Actinic keratosis: Secondary | ICD-10-CM | POA: Diagnosis not present

## 2015-11-20 DIAGNOSIS — C44321 Squamous cell carcinoma of skin of nose: Secondary | ICD-10-CM | POA: Diagnosis not present

## 2016-03-03 DIAGNOSIS — L309 Dermatitis, unspecified: Secondary | ICD-10-CM | POA: Diagnosis not present

## 2016-03-03 DIAGNOSIS — L570 Actinic keratosis: Secondary | ICD-10-CM | POA: Diagnosis not present

## 2016-04-27 DIAGNOSIS — G4730 Sleep apnea, unspecified: Secondary | ICD-10-CM | POA: Diagnosis not present

## 2016-04-27 DIAGNOSIS — Z125 Encounter for screening for malignant neoplasm of prostate: Secondary | ICD-10-CM | POA: Diagnosis not present

## 2016-04-27 DIAGNOSIS — R7301 Impaired fasting glucose: Secondary | ICD-10-CM | POA: Diagnosis not present

## 2016-04-27 DIAGNOSIS — E785 Hyperlipidemia, unspecified: Secondary | ICD-10-CM | POA: Diagnosis not present

## 2016-04-27 DIAGNOSIS — Z79899 Other long term (current) drug therapy: Secondary | ICD-10-CM | POA: Diagnosis not present

## 2016-05-05 DIAGNOSIS — N401 Enlarged prostate with lower urinary tract symptoms: Secondary | ICD-10-CM | POA: Diagnosis not present

## 2016-05-14 DIAGNOSIS — E785 Hyperlipidemia, unspecified: Secondary | ICD-10-CM | POA: Diagnosis not present

## 2016-05-14 DIAGNOSIS — N400 Enlarged prostate without lower urinary tract symptoms: Secondary | ICD-10-CM | POA: Diagnosis not present

## 2016-05-14 DIAGNOSIS — Z6827 Body mass index (BMI) 27.0-27.9, adult: Secondary | ICD-10-CM | POA: Diagnosis not present

## 2016-05-14 DIAGNOSIS — Z0001 Encounter for general adult medical examination with abnormal findings: Secondary | ICD-10-CM | POA: Diagnosis not present

## 2016-06-22 DIAGNOSIS — S335XXA Sprain of ligaments of lumbar spine, initial encounter: Secondary | ICD-10-CM | POA: Diagnosis not present

## 2016-06-22 DIAGNOSIS — M9903 Segmental and somatic dysfunction of lumbar region: Secondary | ICD-10-CM | POA: Diagnosis not present

## 2016-06-22 DIAGNOSIS — M9901 Segmental and somatic dysfunction of cervical region: Secondary | ICD-10-CM | POA: Diagnosis not present

## 2016-06-22 DIAGNOSIS — M47812 Spondylosis without myelopathy or radiculopathy, cervical region: Secondary | ICD-10-CM | POA: Diagnosis not present

## 2016-06-22 DIAGNOSIS — M47816 Spondylosis without myelopathy or radiculopathy, lumbar region: Secondary | ICD-10-CM | POA: Diagnosis not present

## 2016-06-22 DIAGNOSIS — M546 Pain in thoracic spine: Secondary | ICD-10-CM | POA: Diagnosis not present

## 2016-06-22 DIAGNOSIS — M9902 Segmental and somatic dysfunction of thoracic region: Secondary | ICD-10-CM | POA: Diagnosis not present

## 2016-06-24 DIAGNOSIS — M9903 Segmental and somatic dysfunction of lumbar region: Secondary | ICD-10-CM | POA: Diagnosis not present

## 2016-06-24 DIAGNOSIS — M546 Pain in thoracic spine: Secondary | ICD-10-CM | POA: Diagnosis not present

## 2016-06-24 DIAGNOSIS — M9901 Segmental and somatic dysfunction of cervical region: Secondary | ICD-10-CM | POA: Diagnosis not present

## 2016-06-24 DIAGNOSIS — M9902 Segmental and somatic dysfunction of thoracic region: Secondary | ICD-10-CM | POA: Diagnosis not present

## 2016-06-24 DIAGNOSIS — S335XXA Sprain of ligaments of lumbar spine, initial encounter: Secondary | ICD-10-CM | POA: Diagnosis not present

## 2016-06-24 DIAGNOSIS — M47816 Spondylosis without myelopathy or radiculopathy, lumbar region: Secondary | ICD-10-CM | POA: Diagnosis not present

## 2016-06-24 DIAGNOSIS — M47812 Spondylosis without myelopathy or radiculopathy, cervical region: Secondary | ICD-10-CM | POA: Diagnosis not present

## 2016-06-28 DIAGNOSIS — M9902 Segmental and somatic dysfunction of thoracic region: Secondary | ICD-10-CM | POA: Diagnosis not present

## 2016-06-28 DIAGNOSIS — M47816 Spondylosis without myelopathy or radiculopathy, lumbar region: Secondary | ICD-10-CM | POA: Diagnosis not present

## 2016-06-28 DIAGNOSIS — M47812 Spondylosis without myelopathy or radiculopathy, cervical region: Secondary | ICD-10-CM | POA: Diagnosis not present

## 2016-06-28 DIAGNOSIS — S335XXA Sprain of ligaments of lumbar spine, initial encounter: Secondary | ICD-10-CM | POA: Diagnosis not present

## 2016-06-28 DIAGNOSIS — M546 Pain in thoracic spine: Secondary | ICD-10-CM | POA: Diagnosis not present

## 2016-06-28 DIAGNOSIS — M9903 Segmental and somatic dysfunction of lumbar region: Secondary | ICD-10-CM | POA: Diagnosis not present

## 2016-06-28 DIAGNOSIS — M9901 Segmental and somatic dysfunction of cervical region: Secondary | ICD-10-CM | POA: Diagnosis not present

## 2016-07-01 DIAGNOSIS — S335XXA Sprain of ligaments of lumbar spine, initial encounter: Secondary | ICD-10-CM | POA: Diagnosis not present

## 2016-07-01 DIAGNOSIS — M47816 Spondylosis without myelopathy or radiculopathy, lumbar region: Secondary | ICD-10-CM | POA: Diagnosis not present

## 2016-07-01 DIAGNOSIS — M9901 Segmental and somatic dysfunction of cervical region: Secondary | ICD-10-CM | POA: Diagnosis not present

## 2016-07-01 DIAGNOSIS — M9902 Segmental and somatic dysfunction of thoracic region: Secondary | ICD-10-CM | POA: Diagnosis not present

## 2016-07-01 DIAGNOSIS — M546 Pain in thoracic spine: Secondary | ICD-10-CM | POA: Diagnosis not present

## 2016-07-01 DIAGNOSIS — M47812 Spondylosis without myelopathy or radiculopathy, cervical region: Secondary | ICD-10-CM | POA: Diagnosis not present

## 2016-07-01 DIAGNOSIS — M9903 Segmental and somatic dysfunction of lumbar region: Secondary | ICD-10-CM | POA: Diagnosis not present

## 2016-07-06 DIAGNOSIS — M9901 Segmental and somatic dysfunction of cervical region: Secondary | ICD-10-CM | POA: Diagnosis not present

## 2016-07-06 DIAGNOSIS — M546 Pain in thoracic spine: Secondary | ICD-10-CM | POA: Diagnosis not present

## 2016-07-06 DIAGNOSIS — M9903 Segmental and somatic dysfunction of lumbar region: Secondary | ICD-10-CM | POA: Diagnosis not present

## 2016-07-06 DIAGNOSIS — M47812 Spondylosis without myelopathy or radiculopathy, cervical region: Secondary | ICD-10-CM | POA: Diagnosis not present

## 2016-07-06 DIAGNOSIS — M47816 Spondylosis without myelopathy or radiculopathy, lumbar region: Secondary | ICD-10-CM | POA: Diagnosis not present

## 2016-07-06 DIAGNOSIS — M9902 Segmental and somatic dysfunction of thoracic region: Secondary | ICD-10-CM | POA: Diagnosis not present

## 2016-07-06 DIAGNOSIS — S335XXA Sprain of ligaments of lumbar spine, initial encounter: Secondary | ICD-10-CM | POA: Diagnosis not present

## 2016-07-14 DIAGNOSIS — M9901 Segmental and somatic dysfunction of cervical region: Secondary | ICD-10-CM | POA: Diagnosis not present

## 2016-07-14 DIAGNOSIS — M47812 Spondylosis without myelopathy or radiculopathy, cervical region: Secondary | ICD-10-CM | POA: Diagnosis not present

## 2016-07-14 DIAGNOSIS — M9903 Segmental and somatic dysfunction of lumbar region: Secondary | ICD-10-CM | POA: Diagnosis not present

## 2016-07-14 DIAGNOSIS — M546 Pain in thoracic spine: Secondary | ICD-10-CM | POA: Diagnosis not present

## 2016-07-14 DIAGNOSIS — M47816 Spondylosis without myelopathy or radiculopathy, lumbar region: Secondary | ICD-10-CM | POA: Diagnosis not present

## 2016-07-14 DIAGNOSIS — S335XXA Sprain of ligaments of lumbar spine, initial encounter: Secondary | ICD-10-CM | POA: Diagnosis not present

## 2016-07-14 DIAGNOSIS — M9902 Segmental and somatic dysfunction of thoracic region: Secondary | ICD-10-CM | POA: Diagnosis not present

## 2016-07-21 DIAGNOSIS — M9902 Segmental and somatic dysfunction of thoracic region: Secondary | ICD-10-CM | POA: Diagnosis not present

## 2016-07-21 DIAGNOSIS — M9903 Segmental and somatic dysfunction of lumbar region: Secondary | ICD-10-CM | POA: Diagnosis not present

## 2016-07-21 DIAGNOSIS — M9901 Segmental and somatic dysfunction of cervical region: Secondary | ICD-10-CM | POA: Diagnosis not present

## 2016-07-21 DIAGNOSIS — M47812 Spondylosis without myelopathy or radiculopathy, cervical region: Secondary | ICD-10-CM | POA: Diagnosis not present

## 2016-07-21 DIAGNOSIS — M47816 Spondylosis without myelopathy or radiculopathy, lumbar region: Secondary | ICD-10-CM | POA: Diagnosis not present

## 2016-07-21 DIAGNOSIS — M546 Pain in thoracic spine: Secondary | ICD-10-CM | POA: Diagnosis not present

## 2016-07-21 DIAGNOSIS — S335XXA Sprain of ligaments of lumbar spine, initial encounter: Secondary | ICD-10-CM | POA: Diagnosis not present

## 2016-07-29 DIAGNOSIS — M9902 Segmental and somatic dysfunction of thoracic region: Secondary | ICD-10-CM | POA: Diagnosis not present

## 2016-07-29 DIAGNOSIS — M47812 Spondylosis without myelopathy or radiculopathy, cervical region: Secondary | ICD-10-CM | POA: Diagnosis not present

## 2016-07-29 DIAGNOSIS — M9903 Segmental and somatic dysfunction of lumbar region: Secondary | ICD-10-CM | POA: Diagnosis not present

## 2016-07-29 DIAGNOSIS — M9901 Segmental and somatic dysfunction of cervical region: Secondary | ICD-10-CM | POA: Diagnosis not present

## 2016-07-29 DIAGNOSIS — S335XXA Sprain of ligaments of lumbar spine, initial encounter: Secondary | ICD-10-CM | POA: Diagnosis not present

## 2016-07-29 DIAGNOSIS — M546 Pain in thoracic spine: Secondary | ICD-10-CM | POA: Diagnosis not present

## 2016-07-29 DIAGNOSIS — M47816 Spondylosis without myelopathy or radiculopathy, lumbar region: Secondary | ICD-10-CM | POA: Diagnosis not present

## 2016-08-05 DIAGNOSIS — M47816 Spondylosis without myelopathy or radiculopathy, lumbar region: Secondary | ICD-10-CM | POA: Diagnosis not present

## 2016-08-05 DIAGNOSIS — M9902 Segmental and somatic dysfunction of thoracic region: Secondary | ICD-10-CM | POA: Diagnosis not present

## 2016-08-05 DIAGNOSIS — M9903 Segmental and somatic dysfunction of lumbar region: Secondary | ICD-10-CM | POA: Diagnosis not present

## 2016-08-05 DIAGNOSIS — S335XXA Sprain of ligaments of lumbar spine, initial encounter: Secondary | ICD-10-CM | POA: Diagnosis not present

## 2016-08-05 DIAGNOSIS — M546 Pain in thoracic spine: Secondary | ICD-10-CM | POA: Diagnosis not present

## 2016-08-05 DIAGNOSIS — M9901 Segmental and somatic dysfunction of cervical region: Secondary | ICD-10-CM | POA: Diagnosis not present

## 2016-08-05 DIAGNOSIS — M47812 Spondylosis without myelopathy or radiculopathy, cervical region: Secondary | ICD-10-CM | POA: Diagnosis not present

## 2016-08-19 DIAGNOSIS — M47816 Spondylosis without myelopathy or radiculopathy, lumbar region: Secondary | ICD-10-CM | POA: Diagnosis not present

## 2016-08-19 DIAGNOSIS — M546 Pain in thoracic spine: Secondary | ICD-10-CM | POA: Diagnosis not present

## 2016-08-19 DIAGNOSIS — M9903 Segmental and somatic dysfunction of lumbar region: Secondary | ICD-10-CM | POA: Diagnosis not present

## 2016-08-19 DIAGNOSIS — M47812 Spondylosis without myelopathy or radiculopathy, cervical region: Secondary | ICD-10-CM | POA: Diagnosis not present

## 2016-08-19 DIAGNOSIS — M9902 Segmental and somatic dysfunction of thoracic region: Secondary | ICD-10-CM | POA: Diagnosis not present

## 2016-08-19 DIAGNOSIS — M9901 Segmental and somatic dysfunction of cervical region: Secondary | ICD-10-CM | POA: Diagnosis not present

## 2016-08-31 ENCOUNTER — Other Ambulatory Visit: Payer: Self-pay | Admitting: Physician Assistant

## 2016-08-31 DIAGNOSIS — L570 Actinic keratosis: Secondary | ICD-10-CM | POA: Diagnosis not present

## 2016-08-31 DIAGNOSIS — D229 Melanocytic nevi, unspecified: Secondary | ICD-10-CM | POA: Diagnosis not present

## 2016-08-31 DIAGNOSIS — D492 Neoplasm of unspecified behavior of bone, soft tissue, and skin: Secondary | ICD-10-CM | POA: Diagnosis not present

## 2016-09-02 DIAGNOSIS — M47812 Spondylosis without myelopathy or radiculopathy, cervical region: Secondary | ICD-10-CM | POA: Diagnosis not present

## 2016-09-02 DIAGNOSIS — M9901 Segmental and somatic dysfunction of cervical region: Secondary | ICD-10-CM | POA: Diagnosis not present

## 2016-09-02 DIAGNOSIS — M9903 Segmental and somatic dysfunction of lumbar region: Secondary | ICD-10-CM | POA: Diagnosis not present

## 2016-09-02 DIAGNOSIS — M47816 Spondylosis without myelopathy or radiculopathy, lumbar region: Secondary | ICD-10-CM | POA: Diagnosis not present

## 2016-09-02 DIAGNOSIS — M546 Pain in thoracic spine: Secondary | ICD-10-CM | POA: Diagnosis not present

## 2016-09-02 DIAGNOSIS — M9902 Segmental and somatic dysfunction of thoracic region: Secondary | ICD-10-CM | POA: Diagnosis not present

## 2016-09-16 DIAGNOSIS — M47812 Spondylosis without myelopathy or radiculopathy, cervical region: Secondary | ICD-10-CM | POA: Diagnosis not present

## 2016-09-16 DIAGNOSIS — M9901 Segmental and somatic dysfunction of cervical region: Secondary | ICD-10-CM | POA: Diagnosis not present

## 2016-09-16 DIAGNOSIS — M9903 Segmental and somatic dysfunction of lumbar region: Secondary | ICD-10-CM | POA: Diagnosis not present

## 2016-09-16 DIAGNOSIS — M47816 Spondylosis without myelopathy or radiculopathy, lumbar region: Secondary | ICD-10-CM | POA: Diagnosis not present

## 2016-09-16 DIAGNOSIS — M9902 Segmental and somatic dysfunction of thoracic region: Secondary | ICD-10-CM | POA: Diagnosis not present

## 2016-09-16 DIAGNOSIS — M546 Pain in thoracic spine: Secondary | ICD-10-CM | POA: Diagnosis not present

## 2016-09-30 DIAGNOSIS — M47816 Spondylosis without myelopathy or radiculopathy, lumbar region: Secondary | ICD-10-CM | POA: Diagnosis not present

## 2016-09-30 DIAGNOSIS — M546 Pain in thoracic spine: Secondary | ICD-10-CM | POA: Diagnosis not present

## 2016-09-30 DIAGNOSIS — M9903 Segmental and somatic dysfunction of lumbar region: Secondary | ICD-10-CM | POA: Diagnosis not present

## 2016-09-30 DIAGNOSIS — M47812 Spondylosis without myelopathy or radiculopathy, cervical region: Secondary | ICD-10-CM | POA: Diagnosis not present

## 2016-09-30 DIAGNOSIS — M9902 Segmental and somatic dysfunction of thoracic region: Secondary | ICD-10-CM | POA: Diagnosis not present

## 2016-09-30 DIAGNOSIS — M9901 Segmental and somatic dysfunction of cervical region: Secondary | ICD-10-CM | POA: Diagnosis not present

## 2016-10-13 DIAGNOSIS — M9902 Segmental and somatic dysfunction of thoracic region: Secondary | ICD-10-CM | POA: Diagnosis not present

## 2016-10-13 DIAGNOSIS — M47816 Spondylosis without myelopathy or radiculopathy, lumbar region: Secondary | ICD-10-CM | POA: Diagnosis not present

## 2016-10-13 DIAGNOSIS — M47812 Spondylosis without myelopathy or radiculopathy, cervical region: Secondary | ICD-10-CM | POA: Diagnosis not present

## 2016-10-13 DIAGNOSIS — M546 Pain in thoracic spine: Secondary | ICD-10-CM | POA: Diagnosis not present

## 2016-10-13 DIAGNOSIS — M9903 Segmental and somatic dysfunction of lumbar region: Secondary | ICD-10-CM | POA: Diagnosis not present

## 2016-10-13 DIAGNOSIS — M9901 Segmental and somatic dysfunction of cervical region: Secondary | ICD-10-CM | POA: Diagnosis not present

## 2016-10-21 ENCOUNTER — Ambulatory Visit (INDEPENDENT_AMBULATORY_CARE_PROVIDER_SITE_OTHER): Payer: PPO | Admitting: Otolaryngology

## 2016-10-21 DIAGNOSIS — H903 Sensorineural hearing loss, bilateral: Secondary | ICD-10-CM

## 2016-10-21 DIAGNOSIS — R42 Dizziness and giddiness: Secondary | ICD-10-CM | POA: Diagnosis not present

## 2016-11-03 DIAGNOSIS — M9903 Segmental and somatic dysfunction of lumbar region: Secondary | ICD-10-CM | POA: Diagnosis not present

## 2016-11-03 DIAGNOSIS — M9902 Segmental and somatic dysfunction of thoracic region: Secondary | ICD-10-CM | POA: Diagnosis not present

## 2016-11-03 DIAGNOSIS — M47812 Spondylosis without myelopathy or radiculopathy, cervical region: Secondary | ICD-10-CM | POA: Diagnosis not present

## 2016-11-03 DIAGNOSIS — M546 Pain in thoracic spine: Secondary | ICD-10-CM | POA: Diagnosis not present

## 2016-11-03 DIAGNOSIS — M47816 Spondylosis without myelopathy or radiculopathy, lumbar region: Secondary | ICD-10-CM | POA: Diagnosis not present

## 2016-11-03 DIAGNOSIS — M9901 Segmental and somatic dysfunction of cervical region: Secondary | ICD-10-CM | POA: Diagnosis not present

## 2016-12-22 DIAGNOSIS — M47816 Spondylosis without myelopathy or radiculopathy, lumbar region: Secondary | ICD-10-CM | POA: Diagnosis not present

## 2016-12-22 DIAGNOSIS — M9903 Segmental and somatic dysfunction of lumbar region: Secondary | ICD-10-CM | POA: Diagnosis not present

## 2016-12-22 DIAGNOSIS — M9901 Segmental and somatic dysfunction of cervical region: Secondary | ICD-10-CM | POA: Diagnosis not present

## 2016-12-22 DIAGNOSIS — M47812 Spondylosis without myelopathy or radiculopathy, cervical region: Secondary | ICD-10-CM | POA: Diagnosis not present

## 2016-12-22 DIAGNOSIS — M9902 Segmental and somatic dysfunction of thoracic region: Secondary | ICD-10-CM | POA: Diagnosis not present

## 2016-12-22 DIAGNOSIS — M546 Pain in thoracic spine: Secondary | ICD-10-CM | POA: Diagnosis not present

## 2017-02-08 DIAGNOSIS — I219 Acute myocardial infarction, unspecified: Secondary | ICD-10-CM

## 2017-02-08 HISTORY — DX: Acute myocardial infarction, unspecified: I21.9

## 2017-03-09 DIAGNOSIS — M47812 Spondylosis without myelopathy or radiculopathy, cervical region: Secondary | ICD-10-CM | POA: Diagnosis not present

## 2017-03-09 DIAGNOSIS — M9903 Segmental and somatic dysfunction of lumbar region: Secondary | ICD-10-CM | POA: Diagnosis not present

## 2017-03-09 DIAGNOSIS — M47816 Spondylosis without myelopathy or radiculopathy, lumbar region: Secondary | ICD-10-CM | POA: Diagnosis not present

## 2017-03-09 DIAGNOSIS — M546 Pain in thoracic spine: Secondary | ICD-10-CM | POA: Diagnosis not present

## 2017-03-09 DIAGNOSIS — M9901 Segmental and somatic dysfunction of cervical region: Secondary | ICD-10-CM | POA: Diagnosis not present

## 2017-03-09 DIAGNOSIS — M9902 Segmental and somatic dysfunction of thoracic region: Secondary | ICD-10-CM | POA: Diagnosis not present

## 2017-04-06 DIAGNOSIS — M546 Pain in thoracic spine: Secondary | ICD-10-CM | POA: Diagnosis not present

## 2017-04-06 DIAGNOSIS — M9901 Segmental and somatic dysfunction of cervical region: Secondary | ICD-10-CM | POA: Diagnosis not present

## 2017-04-06 DIAGNOSIS — M9902 Segmental and somatic dysfunction of thoracic region: Secondary | ICD-10-CM | POA: Diagnosis not present

## 2017-04-06 DIAGNOSIS — M47816 Spondylosis without myelopathy or radiculopathy, lumbar region: Secondary | ICD-10-CM | POA: Diagnosis not present

## 2017-04-06 DIAGNOSIS — M9903 Segmental and somatic dysfunction of lumbar region: Secondary | ICD-10-CM | POA: Diagnosis not present

## 2017-04-06 DIAGNOSIS — M47812 Spondylosis without myelopathy or radiculopathy, cervical region: Secondary | ICD-10-CM | POA: Diagnosis not present

## 2017-04-28 DIAGNOSIS — L821 Other seborrheic keratosis: Secondary | ICD-10-CM | POA: Diagnosis not present

## 2017-04-28 DIAGNOSIS — L570 Actinic keratosis: Secondary | ICD-10-CM | POA: Diagnosis not present

## 2017-04-28 DIAGNOSIS — D229 Melanocytic nevi, unspecified: Secondary | ICD-10-CM | POA: Diagnosis not present

## 2017-05-04 DIAGNOSIS — M9903 Segmental and somatic dysfunction of lumbar region: Secondary | ICD-10-CM | POA: Diagnosis not present

## 2017-05-04 DIAGNOSIS — M546 Pain in thoracic spine: Secondary | ICD-10-CM | POA: Diagnosis not present

## 2017-05-04 DIAGNOSIS — M47812 Spondylosis without myelopathy or radiculopathy, cervical region: Secondary | ICD-10-CM | POA: Diagnosis not present

## 2017-05-04 DIAGNOSIS — M47816 Spondylosis without myelopathy or radiculopathy, lumbar region: Secondary | ICD-10-CM | POA: Diagnosis not present

## 2017-05-04 DIAGNOSIS — M9902 Segmental and somatic dysfunction of thoracic region: Secondary | ICD-10-CM | POA: Diagnosis not present

## 2017-05-04 DIAGNOSIS — M9901 Segmental and somatic dysfunction of cervical region: Secondary | ICD-10-CM | POA: Diagnosis not present

## 2017-05-24 DIAGNOSIS — Z79899 Other long term (current) drug therapy: Secondary | ICD-10-CM | POA: Diagnosis not present

## 2017-05-24 DIAGNOSIS — E785 Hyperlipidemia, unspecified: Secondary | ICD-10-CM | POA: Diagnosis not present

## 2017-05-24 DIAGNOSIS — N400 Enlarged prostate without lower urinary tract symptoms: Secondary | ICD-10-CM | POA: Diagnosis not present

## 2017-05-24 DIAGNOSIS — Z125 Encounter for screening for malignant neoplasm of prostate: Secondary | ICD-10-CM | POA: Diagnosis not present

## 2017-05-24 DIAGNOSIS — R7301 Impaired fasting glucose: Secondary | ICD-10-CM | POA: Diagnosis not present

## 2017-05-24 DIAGNOSIS — G4730 Sleep apnea, unspecified: Secondary | ICD-10-CM | POA: Diagnosis not present

## 2017-05-31 ENCOUNTER — Encounter: Payer: Self-pay | Admitting: *Deleted

## 2017-05-31 ENCOUNTER — Encounter: Payer: Self-pay | Admitting: Cardiology

## 2017-05-31 ENCOUNTER — Ambulatory Visit: Payer: PPO | Admitting: Cardiology

## 2017-05-31 VITALS — BP 152/80 | HR 84 | Ht 70.0 in | Wt 196.0 lb

## 2017-05-31 DIAGNOSIS — Z0001 Encounter for general adult medical examination with abnormal findings: Secondary | ICD-10-CM | POA: Diagnosis not present

## 2017-05-31 DIAGNOSIS — R0789 Other chest pain: Secondary | ICD-10-CM | POA: Diagnosis not present

## 2017-05-31 DIAGNOSIS — N183 Chronic kidney disease, stage 3 (moderate): Secondary | ICD-10-CM | POA: Diagnosis not present

## 2017-05-31 DIAGNOSIS — Z6827 Body mass index (BMI) 27.0-27.9, adult: Secondary | ICD-10-CM | POA: Diagnosis not present

## 2017-05-31 DIAGNOSIS — R079 Chest pain, unspecified: Secondary | ICD-10-CM | POA: Diagnosis not present

## 2017-05-31 DIAGNOSIS — R7301 Impaired fasting glucose: Secondary | ICD-10-CM | POA: Diagnosis not present

## 2017-05-31 NOTE — H&P (View-Only) (Signed)
Clinical Summary Chase Guerrero is a 72 y.o.male seen as new consult, referred by Dr Willey Blade for chest pain and abnormal EKG  1. Chest pain - episode last week of chest pain while at rest. Pressure upper midchest, up 6-7/10.  - mild SOB. Somewhat positional. Pain lasted about 2-3 hrs, varying in severity. - no recent SOB/DOE. Sedenatary lifestyle.   CAD risk factors: former smoke x 20 years. Brother MIs in mid 17s.    Past Medical History:  Diagnosis Date  . Enlarged prostate      No Known Allergies   Current Outpatient Medications  Medication Sig Dispense Refill  . finasteride (PROSCAR) 5 MG tablet Take 5 mg by mouth daily.    . polyethylene glycol-electrolytes (TRILYTE) 420 g solution Take 4,000 mLs by mouth as directed. 4000 mL 0   No current facility-administered medications for this visit.      Past Surgical History:  Procedure Laterality Date  . COLONOSCOPY N/A 07/23/2015   Procedure: COLONOSCOPY;  Surgeon: Daneil Dolin, MD;  Location: [facility];  Service: Endoscopy;  Laterality: N/A;  10:30 Am  . Right hand surgery    . Right knee arthroscopy       No Known Allergies    Family history Brother with MI in his 49s   Social History Chase Guerrero reports that he has quit smoking. His smoking use included cigarettes. He has a 10.00 pack-year smoking history. He does not have any smokeless tobacco history on file. Chase Guerrero reports that he does not drink alcohol.   Review of Systems CONSTITUTIONAL: No weight loss, fever, chills, weakness or fatigue.  HEENT: Eyes: No visual loss, blurred vision, double vision or yellow sclerae.No hearing loss, sneezing, congestion, runny nose or sore throat.  SKIN: No rash or itching.  CARDIOVASCULAR: per hpi RESPIRATORY: No shortness of breath, cough or sputum.  GASTROINTESTINAL: No anorexia, nausea, vomiting or diarrhea. No abdominal pain or blood.  GENITOURINARY: No burning on urination, no polyuria NEUROLOGICAL: No  headache, dizziness, syncope, paralysis, ataxia, numbness or tingling in the extremities. No change in bowel or bladder control.  MUSCULOSKELETAL: No muscle, back pain, joint pain or stiffness.  LYMPHATICS: No enlarged nodes. No history of splenectomy.  PSYCHIATRIC: No history of depression or anxiety.  ENDOCRINOLOGIC: No reports of sweating, cold or heat intolerance. No polyuria or polydipsia.  Marland Kitchen   Physical Examination Vitals:   05/31/17 1428 05/31/17 1429  BP: (!) 150/84 (!) 152/80  Pulse: 84   SpO2: 96%    Vitals:   05/31/17 1428  Weight: 196 lb (88.9 kg)  Height: 5\' 10"  (1.778 m)    Gen: resting comfortably, no acute distress HEENT: no scleral icterus, pupils equal round and reactive, no palptable cervical adenopathy,  CV: RRR, no m/r/g, no jvd Resp: Clear to auscultation bilaterally GI: abdomen is soft, non-tender, non-distended, normal bowel sounds, no hepatosplenomegaly MSK: extremities are warm, no edema.  Skin: warm, no rash Neuro:  no focal deficits Psych: appropriate affect      Assessment and Plan  1. Chest pain - somewhat atypical chest pain episode, however EKG from pcp's office does show new diffuse T-wave inversions - we will plan for an exercise nuclear stress test to further evaluate   F/u pending stress results    06/09/17 Addendum High risk stress test, we will plan for cath. Cath discussed with patient in detail by phone.  I have reviewed the risks, indications, and alternatives to cardiac catheterization, possible angioplasty, and  stenting with the patient today. Risks include but are not limited to bleeding, infection, vascular injury, stroke, myocardial infection, arrhythmia, kidney injury, radiation-related injury in the case of prolonged fluoroscopy use, emergency cardiac surgery, and death. The patient understands the risks of serious complication is 1-2 in 5625 with diagnostic cardiac cath and 1-2% or less with angioplasty/stenting.    [name], M.D.

## 2017-05-31 NOTE — Patient Instructions (Signed)
Medication Instructions:  Your physician recommends that you continue on your current medications as directed. Please refer to the Current Medication list given to you today.   Labwork: NONE   Testing/Procedures: Your physician has requested that you have en exercise stress myoview. For further information please visit [email or web address]. Please follow instruction sheet, as given.   Follow-Up: Your physician recommends that you schedule a follow-up appointment pending test results.    Any Other Special Instructions Will Be Listed Below (If Applicable).     If you need a refill on your cardiac medications before your next appointment, please call your pharmacy.  Thank you for choosing [facility]!   

## 2017-05-31 NOTE — Progress Notes (Addendum)
Clinical Summary Mr. Kronberg is a 72 y.o.male seen as new consult, referred by Dr Willey Blade for chest pain and abnormal EKG  1. Chest pain - episode last week of chest pain while at rest. Pressure upper midchest, up 6-7/10.  - mild SOB. Somewhat positional. Pain lasted about 2-3 hrs, varying in severity. - no recent SOB/DOE. Sedenatary lifestyle.   CAD risk factors: former smoke x 20 years. Brother MIs in mid 27s.    Past Medical History:  Diagnosis Date  . Enlarged prostate      No Known Allergies   Current Outpatient Medications  Medication Sig Dispense Refill  . finasteride (PROSCAR) 5 MG tablet Take 5 mg by mouth daily.    . polyethylene glycol-electrolytes (TRILYTE) 420 g solution Take 4,000 mLs by mouth as directed. 4000 mL 0   No current facility-administered medications for this visit.      Past Surgical History:  Procedure Laterality Date  . COLONOSCOPY N/A 07/23/2015   Procedure: COLONOSCOPY;  Surgeon: Daneil Dolin, MD;  Location: [facility];  Service: Endoscopy;  Laterality: N/A;  10:30 Am  . Right hand surgery    . Right knee arthroscopy       No Known Allergies    Family history Brother with MI in his 54s   Social History Mr. Mcenroe reports that he has quit smoking. His smoking use included cigarettes. He has a 10.00 pack-year smoking history. He does not have any smokeless tobacco history on file. Mr. Fennell reports that he does not drink alcohol.   Review of Systems CONSTITUTIONAL: No weight loss, fever, chills, weakness or fatigue.  HEENT: Eyes: No visual loss, blurred vision, double vision or yellow sclerae.No hearing loss, sneezing, congestion, runny nose or sore throat.  SKIN: No rash or itching.  CARDIOVASCULAR: per hpi RESPIRATORY: No shortness of breath, cough or sputum.  GASTROINTESTINAL: No anorexia, nausea, vomiting or diarrhea. No abdominal pain or blood.  GENITOURINARY: No burning on urination, no polyuria NEUROLOGICAL: No  headache, dizziness, syncope, paralysis, ataxia, numbness or tingling in the extremities. No change in bowel or bladder control.  MUSCULOSKELETAL: No muscle, back pain, joint pain or stiffness.  LYMPHATICS: No enlarged nodes. No history of splenectomy.  PSYCHIATRIC: No history of depression or anxiety.  ENDOCRINOLOGIC: No reports of sweating, cold or heat intolerance. No polyuria or polydipsia.  Marland Kitchen   Physical Examination Vitals:   05/31/17 1428 05/31/17 1429  BP: (!) 150/84 (!) 152/80  Pulse: 84   SpO2: 96%    Vitals:   05/31/17 1428  Weight: 196 lb (88.9 kg)  Height: 5\' 10"  (1.778 m)    Gen: resting comfortably, no acute distress HEENT: no scleral icterus, pupils equal round and reactive, no palptable cervical adenopathy,  CV: RRR, no m/r/g, no jvd Resp: Clear to auscultation bilaterally GI: abdomen is soft, non-tender, non-distended, normal bowel sounds, no hepatosplenomegaly MSK: extremities are warm, no edema.  Skin: warm, no rash Neuro:  no focal deficits Psych: appropriate affect      Assessment and Plan  1. Chest pain - somewhat atypical chest pain episode, however EKG from pcp's office does show new diffuse T-wave inversions - we will plan for an exercise nuclear stress test to further evaluate   F/u pending stress results    06/09/17 Addendum High risk stress test, we will plan for cath. Cath discussed with patient in detail by phone.  I have reviewed the risks, indications, and alternatives to cardiac catheterization, possible angioplasty, and  stenting with the patient today. Risks include but are not limited to bleeding, infection, vascular injury, stroke, myocardial infection, arrhythmia, kidney injury, radiation-related injury in the case of prolonged fluoroscopy use, emergency cardiac surgery, and death. The patient understands the risks of serious complication is 1-2 in 8099 with diagnostic cardiac cath and 1-2% or less with angioplasty/stenting.    [name], M.D.

## 2017-06-01 DIAGNOSIS — M9901 Segmental and somatic dysfunction of cervical region: Secondary | ICD-10-CM | POA: Diagnosis not present

## 2017-06-01 DIAGNOSIS — M546 Pain in thoracic spine: Secondary | ICD-10-CM | POA: Diagnosis not present

## 2017-06-01 DIAGNOSIS — M9903 Segmental and somatic dysfunction of lumbar region: Secondary | ICD-10-CM | POA: Diagnosis not present

## 2017-06-01 DIAGNOSIS — M47816 Spondylosis without myelopathy or radiculopathy, lumbar region: Secondary | ICD-10-CM | POA: Diagnosis not present

## 2017-06-01 DIAGNOSIS — M47812 Spondylosis without myelopathy or radiculopathy, cervical region: Secondary | ICD-10-CM | POA: Diagnosis not present

## 2017-06-01 DIAGNOSIS — M9902 Segmental and somatic dysfunction of thoracic region: Secondary | ICD-10-CM | POA: Diagnosis not present

## 2017-06-02 ENCOUNTER — Other Ambulatory Visit: Payer: Self-pay

## 2017-06-02 ENCOUNTER — Encounter: Payer: Self-pay | Admitting: Cardiology

## 2017-06-02 DIAGNOSIS — R079 Chest pain, unspecified: Secondary | ICD-10-CM

## 2017-06-07 ENCOUNTER — Encounter (HOSPITAL_COMMUNITY): Payer: Self-pay

## 2017-06-07 ENCOUNTER — Encounter (HOSPITAL_BASED_OUTPATIENT_CLINIC_OR_DEPARTMENT_OTHER)
Admission: RE | Admit: 2017-06-07 | Discharge: 2017-06-07 | Disposition: A | Discharge status: Home / Self Care | Payer: PPO | Source: Ambulatory Visit | Attending: Cardiology | Admitting: Cardiology

## 2017-06-07 ENCOUNTER — Encounter (HOSPITAL_COMMUNITY)
Admission: RE | Admit: 2017-06-07 | Discharge: 2017-06-07 | Disposition: A | Discharge status: Home / Self Care | Payer: PPO | Source: Ambulatory Visit | Attending: Cardiology | Admitting: Cardiology

## 2017-06-07 DIAGNOSIS — R079 Chest pain, unspecified: Secondary | ICD-10-CM | POA: Diagnosis not present

## 2017-06-07 HISTORY — DX: Malignant (primary) neoplasm, unspecified: C80.1

## 2017-06-07 LAB — NM MYOCAR MULTI W/SPECT W/WALL MOTION / EF
CHL CUP NUCLEAR SRS: 6
CHL CUP NUCLEAR SSS: 21
CHL CUP RESTING HR STRESS: 61 {beats}/min
CHL RATE OF PERCEIVED EXERTION: 17
CSEPEDS: 25 s
CSEPEW: 7 METS
Exercise duration (min): 5 min
LV dias vol: 77 mL (ref 62–150)
LVSYSVOL: 39 mL
MPHR: 147 {beats}/min
Peak HR: 133 {beats}/min
Percent HR: 90 %
RATE: 0.45
SDS: 15
TID: 1.02

## 2017-06-07 MED ORDER — SODIUM CHLORIDE 0.9% FLUSH
INTRAVENOUS | Status: AC
  Administered 2017-06-07: 10 mL via INTRAVENOUS
  Filled 2017-06-07: qty 10

## 2017-06-07 MED ORDER — TECHNETIUM TC 99M TETROFOSMIN IV KIT
30.0000 | PACK | Freq: Once | INTRAVENOUS | Status: AC | PRN
  Administered 2017-06-07: 32 via INTRAVENOUS

## 2017-06-07 MED ORDER — TECHNETIUM TC 99M TETROFOSMIN IV KIT
10.0000 | PACK | Freq: Once | INTRAVENOUS | Status: AC | PRN
Start: 2017-06-07 — End: 2017-06-07
  Administered 2017-06-07: 11 via INTRAVENOUS

## 2017-06-07 MED ORDER — REGADENOSON 0.4 MG/5ML IV SOLN
INTRAVENOUS | Status: AC
  Filled 2017-06-07: qty 5

## 2017-06-08 ENCOUNTER — Telehealth: Payer: Self-pay | Admitting: Cardiology

## 2017-06-08 NOTE — Progress Notes (Signed)
I have reviewed the risks, indications, and alternatives to cardiac catheterization, possible angioplasty, and stenting with the patient today by phone. . Risks include but are not limited to bleeding, infection, vascular injury, stroke, myocardial infection, arrhythmia, kidney injury, radiation-related injury in the case of prolonged fluoroscopy use, emergency cardiac surgery, and death. The patient understands the risks of serious complication is 1-2 in 9937 with diagnostic cardiac cath and 1-2% or less with angioplasty/stenting.    He agrees to proceed, we will arrange left heart cath.    Carlyle Dolly MD

## 2017-06-09 ENCOUNTER — Telehealth: Payer: Self-pay

## 2017-06-09 ENCOUNTER — Other Ambulatory Visit: Payer: Self-pay | Admitting: Cardiology

## 2017-06-09 DIAGNOSIS — Z01818 Encounter for other preprocedural examination: Secondary | ICD-10-CM

## 2017-06-09 DIAGNOSIS — R0789 Other chest pain: Secondary | ICD-10-CM

## 2017-06-09 NOTE — Telephone Encounter (Signed)
-----   Message from [name], MD sent at 06/08/2017  1:11 PM EDT ----- I called patient today and disucssed results with him. Discussed details about a cath and he is in agreement for procedure, we will arrange to be done next week. Please arrange a left heart cath for chest pain and abnormal stress test for next week   Carlyle Dolly MD

## 2017-06-10 ENCOUNTER — Other Ambulatory Visit (HOSPITAL_COMMUNITY)
Admission: RE | Admit: 2017-06-10 | Discharge: 2017-06-10 | Disposition: A | Discharge status: Home / Self Care | Payer: PPO | Source: Ambulatory Visit | Attending: Cardiology | Admitting: Cardiology

## 2017-06-10 ENCOUNTER — Telehealth: Payer: Self-pay | Admitting: *Deleted

## 2017-06-10 ENCOUNTER — Ambulatory Visit (HOSPITAL_COMMUNITY)
Admission: RE | Admit: 2017-06-10 | Discharge: 2017-06-10 | Disposition: A | Discharge status: Home / Self Care | Payer: PPO | Source: Ambulatory Visit | Attending: Cardiology | Admitting: Cardiology

## 2017-06-10 DIAGNOSIS — R0602 Shortness of breath: Secondary | ICD-10-CM | POA: Diagnosis not present

## 2017-06-10 DIAGNOSIS — Z01818 Encounter for other preprocedural examination: Secondary | ICD-10-CM | POA: Insufficient documentation

## 2017-06-10 LAB — PROTIME-INR
INR: 1.04
Prothrombin Time: 13.5 seconds (ref 11.4–15.2)

## 2017-06-10 LAB — BASIC METABOLIC PANEL
ANION GAP: 10 (ref 5–15)
BUN: 17 mg/dL (ref 6–20)
CHLORIDE: 103 mmol/L (ref 101–111)
CO2: 24 mmol/L (ref 22–32)
Calcium: 8.8 mg/dL — ABNORMAL LOW (ref 8.9–10.3)
Creatinine, Ser: 1.29 mg/dL — ABNORMAL HIGH (ref 0.61–1.24)
GFR calc Af Amer: >60 mL/min (ref >60)
GFR, EST NON AFRICAN AMERICAN: 53 mL/min — AB (ref >60)
GLUCOSE: 113 mg/dL — AB (ref 65–99)
POTASSIUM: 4.3 mmol/L (ref 3.5–5.1)
Sodium: 137 mmol/L (ref 135–145)

## 2017-06-10 LAB — CBC WITH DIFFERENTIAL/PLATELET
BASOS ABS: 0 10*3/uL (ref 0.0–0.1)
Basophils Relative: 0 %
Eosinophils Absolute: 0.4 10*3/uL (ref 0.0–0.7)
Eosinophils Relative: 5 %
HCT: 43.2 % (ref 39.0–52.0)
Hemoglobin: 14.3 g/dL (ref 13.0–17.0)
LYMPHS ABS: 3.6 10*3/uL (ref 0.7–4.0)
Lymphocytes Relative: 40 %
MCH: 28.7 pg (ref 26.0–34.0)
MCHC: 33.1 g/dL (ref 30.0–36.0)
MCV: 86.7 fL (ref 78.0–100.0)
MONO ABS: 0.7 10*3/uL (ref 0.1–1.0)
Monocytes Relative: 8 %
NEUTROS ABS: 4.1 10*3/uL (ref 1.7–7.7)
Neutrophils Relative %: 47 %
Platelets: 224 10*3/uL (ref 150–400)
RBC: 4.98 MIL/uL (ref 4.22–5.81)
RDW: 13.8 % (ref 11.5–15.5)
WBC: 8.8 10*3/uL (ref 4.0–10.5)

## 2017-06-10 NOTE — Telephone Encounter (Signed)
Called patient with test results. No answer. Left message to call back.  

## 2017-06-10 NOTE — Telephone Encounter (Signed)
-----   Message from [name], MD sent at 06/10/2017 11:13 AM EDT ----- Labs look good  Zandra Abts MD

## 2017-06-13 ENCOUNTER — Telehealth: Payer: Self-pay | Admitting: *Deleted

## 2017-06-13 NOTE — Telephone Encounter (Signed)
Pt contacted pre-catheterization scheduled at [facility] for: Tuesday Jun 14, 2017 7:30 AM Verified arrival time and place: [facility] Entrance A at: 5:30 AM  No solid food after midnight prior to cath, clear liquids until 5 AM day of procedure. Verified allergies in Epic. Verified no diabetes medications.  AM meds can be  taken pre-cath with sip of water including: ASA 81 mg  Confirmed patient has responsible person to drive home post procedure and observe patient for 24 hours: yes

## 2017-06-14 ENCOUNTER — Ambulatory Visit (HOSPITAL_COMMUNITY)
Admission: RE | Admit: 2017-06-14 | Discharge: 2017-06-14 | Disposition: A | Discharge status: Home / Self Care | Payer: PPO | Source: Ambulatory Visit | Attending: Cardiology | Admitting: Cardiology

## 2017-06-14 ENCOUNTER — Ambulatory Visit (HOSPITAL_COMMUNITY)
Admission: RE | Disposition: A | Discharge status: Home / Self Care | Payer: Self-pay | Source: Ambulatory Visit | Attending: Cardiology

## 2017-06-14 ENCOUNTER — Encounter (HOSPITAL_COMMUNITY): Payer: Self-pay | Admitting: Cardiology

## 2017-06-14 DIAGNOSIS — I209 Angina pectoris, unspecified: Secondary | ICD-10-CM | POA: Diagnosis present

## 2017-06-14 DIAGNOSIS — R9439 Abnormal result of other cardiovascular function study: Secondary | ICD-10-CM | POA: Diagnosis present

## 2017-06-14 DIAGNOSIS — I2582 Chronic total occlusion of coronary artery: Secondary | ICD-10-CM | POA: Insufficient documentation

## 2017-06-14 DIAGNOSIS — Z87891 Personal history of nicotine dependence: Secondary | ICD-10-CM | POA: Insufficient documentation

## 2017-06-14 DIAGNOSIS — R0789 Other chest pain: Secondary | ICD-10-CM

## 2017-06-14 DIAGNOSIS — Z8249 Family history of ischemic heart disease and other diseases of the circulatory system: Secondary | ICD-10-CM | POA: Diagnosis not present

## 2017-06-14 DIAGNOSIS — I2089 Other forms of angina pectoris: Secondary | ICD-10-CM | POA: Diagnosis present

## 2017-06-14 DIAGNOSIS — I208 Other forms of angina pectoris: Secondary | ICD-10-CM | POA: Diagnosis present

## 2017-06-14 DIAGNOSIS — Z79899 Other long term (current) drug therapy: Secondary | ICD-10-CM | POA: Insufficient documentation

## 2017-06-14 DIAGNOSIS — I2584 Coronary atherosclerosis due to calcified coronary lesion: Secondary | ICD-10-CM | POA: Insufficient documentation

## 2017-06-14 DIAGNOSIS — I25119 Atherosclerotic heart disease of native coronary artery with unspecified angina pectoris: Secondary | ICD-10-CM | POA: Insufficient documentation

## 2017-06-14 DIAGNOSIS — I2510 Atherosclerotic heart disease of native coronary artery without angina pectoris: Secondary | ICD-10-CM | POA: Diagnosis not present

## 2017-06-14 HISTORY — PX: LEFT HEART CATH AND CORONARY ANGIOGRAPHY: CATH118249

## 2017-06-14 SURGERY — LEFT HEART CATH AND CORONARY ANGIOGRAPHY
Anesthesia: LOCAL

## 2017-06-14 MED ORDER — SODIUM CHLORIDE 0.9 % IV SOLN: 250.0000 mL | INTRAVENOUS | Status: DC | PRN

## 2017-06-14 MED ORDER — LIDOCAINE HCL (PF) 1 % IJ SOLN
INTRAMUSCULAR | Status: AC
  Filled 2017-06-14: qty 30

## 2017-06-14 MED ORDER — VERAPAMIL HCL 2.5 MG/ML IV SOLN
INTRAVENOUS | Status: AC
  Filled 2017-06-14: qty 2

## 2017-06-14 MED ORDER — ONDANSETRON HCL 4 MG/2ML IJ SOLN: 4.0000 mg | Freq: Four times a day (QID) | INTRAMUSCULAR | Status: DC | PRN

## 2017-06-14 MED ORDER — ATORVASTATIN CALCIUM 80 MG PO TABS: 80.0000 mg | ORAL_TABLET | Freq: Every day | ORAL | Status: DC

## 2017-06-14 MED ORDER — ASPIRIN 81 MG PO CHEW: 81.0000 mg | CHEWABLE_TABLET | ORAL | Status: DC

## 2017-06-14 MED ORDER — METOPROLOL TARTRATE 25 MG PO TABS
25.0000 mg | ORAL_TABLET | Freq: Two times a day (BID) | ORAL | Status: DC
  Administered 2017-06-14: 25 mg via ORAL
  Filled 2017-06-14 (×2): qty 1

## 2017-06-14 MED ORDER — MIDAZOLAM HCL 2 MG/2ML IJ SOLN
INTRAMUSCULAR | Status: DC | PRN
  Administered 2017-06-14: 2 mg via INTRAVENOUS

## 2017-06-14 MED ORDER — ACETAMINOPHEN 325 MG PO TABS: 650.0000 mg | ORAL_TABLET | ORAL | Status: DC | PRN

## 2017-06-14 MED ORDER — HEPARIN (PORCINE) IN NACL 2-0.9 UNITS/ML
INTRAMUSCULAR | Status: AC | PRN
  Administered 2017-06-14 (×2): 500 mL

## 2017-06-14 MED ORDER — VERAPAMIL HCL 2.5 MG/ML IV SOLN
INTRAVENOUS | Status: DC | PRN
  Administered 2017-06-14: 10 mL via INTRA_ARTERIAL

## 2017-06-14 MED ORDER — ASPIRIN EC 81 MG PO TBEC: 81.0000 mg | DELAYED_RELEASE_TABLET | Freq: Every day | ORAL | Status: DC

## 2017-06-14 MED ORDER — MIDAZOLAM HCL 2 MG/2ML IJ SOLN
INTRAMUSCULAR | Status: AC
  Filled 2017-06-14: qty 2

## 2017-06-14 MED ORDER — SODIUM CHLORIDE 0.9 % IV SOLN: INTRAVENOUS | Status: AC

## 2017-06-14 MED ORDER — METOPROLOL TARTRATE 25 MG PO TABS: 25.0000 mg | ORAL_TABLET | Freq: Two times a day (BID) | ORAL | 6 refills | Status: DC

## 2017-06-14 MED ORDER — HEPARIN (PORCINE) IN NACL 1000-0.9 UT/500ML-% IV SOLN
INTRAVENOUS | Status: AC
  Filled 2017-06-14: qty 1000

## 2017-06-14 MED ORDER — HEPARIN SODIUM (PORCINE) 1000 UNIT/ML IJ SOLN
INTRAMUSCULAR | Status: AC
  Filled 2017-06-14: qty 1

## 2017-06-14 MED ORDER — LIDOCAINE HCL (PF) 1 % IJ SOLN
INTRAMUSCULAR | Status: DC | PRN
  Administered 2017-06-14: 2 mL

## 2017-06-14 MED ORDER — SODIUM CHLORIDE 0.9 % WEIGHT BASED INFUSION: 1.0000 mL/kg/h | INTRAVENOUS | Status: DC

## 2017-06-14 MED ORDER — SODIUM CHLORIDE 0.9% FLUSH: 3.0000 mL | Freq: Two times a day (BID) | INTRAVENOUS | Status: DC

## 2017-06-14 MED ORDER — HEPARIN SODIUM (PORCINE) 1000 UNIT/ML IJ SOLN
INTRAMUSCULAR | Status: DC | PRN
  Administered 2017-06-14: 5000 [IU] via INTRAVENOUS

## 2017-06-14 MED ORDER — SODIUM CHLORIDE 0.9 % WEIGHT BASED INFUSION
3.0000 mL/kg/h | INTRAVENOUS | Status: AC
  Administered 2017-06-14: 3 mL/kg/h via INTRAVENOUS

## 2017-06-14 MED ORDER — FENTANYL CITRATE (PF) 100 MCG/2ML IJ SOLN
INTRAMUSCULAR | Status: AC
  Filled 2017-06-14: qty 2

## 2017-06-14 MED ORDER — SODIUM CHLORIDE 0.9% FLUSH: 3.0000 mL | INTRAVENOUS | Status: DC | PRN

## 2017-06-14 MED ORDER — ATORVASTATIN CALCIUM 80 MG PO TABS: 80.0000 mg | ORAL_TABLET | Freq: Every day | ORAL | 11 refills | Status: DC

## 2017-06-14 MED ORDER — FENTANYL CITRATE (PF) 100 MCG/2ML IJ SOLN
INTRAMUSCULAR | Status: DC | PRN
  Administered 2017-06-14: 25 ug via INTRAVENOUS

## 2017-06-14 MED ORDER — IOHEXOL 350 MG/ML SOLN
INTRAVENOUS | Status: DC | PRN
  Administered 2017-06-14: 100 mL via INTRA_ARTERIAL

## 2017-06-14 SURGICAL SUPPLY — 13 items
CATH INFINITI 5 FR JL3.5 (CATHETERS) ×1 IMPLANT
CATH INFINITI 5FR ANG PIGTAIL (CATHETERS) ×1 IMPLANT
CATH OPTITORQUE TIG 4.0 5F (CATHETERS) ×1 IMPLANT
CATH VISTA GUIDE 6FR XBLAD3.5 (CATHETERS) ×1 IMPLANT
DEVICE RAD COMP TR BAND LRG (VASCULAR PRODUCTS) ×1 IMPLANT
GLIDESHEATH SLEND A-KIT 6F 22G (SHEATH) ×1 IMPLANT
GUIDEWIRE INQWIRE 1.5J.035X260 (WIRE) IMPLANT
INQWIRE 1.5J .035X260CM (WIRE) ×2
KIT HEART LEFT (KITS) ×2 IMPLANT
PACK CARDIAC CATHETERIZATION (CUSTOM PROCEDURE TRAY) ×2 IMPLANT
SYR MEDRAD MARK V 150ML (SYRINGE) ×2 IMPLANT
TRANSDUCER W/STOPCOCK (MISCELLANEOUS) ×2 IMPLANT
TUBING CIL FLEX 10 FLL-RA (TUBING) ×2 IMPLANT

## 2017-06-14 NOTE — Discharge Instructions (Signed)

## 2017-06-14 NOTE — Interval H&P Note (Signed)
History and Physical Interval Note:  06/14/2017 7:21 AM   I have reviewed the risks, indications, and alternatives to cardiac catheterization, possible angioplasty, and stenting with the patient today by phone. . Risks include but are not limited to bleeding, infection, vascular injury, stroke, myocardial infection, arrhythmia, kidney injury, radiation-related injury in the case of prolonged fluoroscopy use, emergency cardiac surgery, and death. The patient understands the risks of serious complication is 1-2 in 5465 with diagnostic cardiac cath and 1-2% or less with angioplasty/stenting.    He agrees to proceed, we will arrange left heart cath.    Carlyle Dolly MD   Chase Guerrero  has presented today for surgery, with the diagnosis of abnormal stress test - cp (Angina @ Rest).   The various methods of treatment have been discussed with the patient and family. After consideration of risks, benefits and other options for treatment, the patient has consented to  Procedure(s): LEFT HEART CATH AND CORONARY ANGIOGRAPHY (N/A) with possible PERCUTANEOUS CORONARY INTERVENTION as a surgical intervention .  The patient's history has been reviewed, patient examined, no change in status, stable for surgery.  I have reviewed the patient's chart and labs.  Questions were answered to the patient's satisfaction.   Cath Lab Visit (complete for each Cath Lab visit)  Clinical Evaluation Leading to the Procedure:   ACS: No.  Non-ACS:    Anginal Classification: CCS III  Anti-ischemic medical therapy: No Therapy  Non-Invasive Test Results: High-risk stress test findings: cardiac mortality >3%/year  Prior CABG: No previous CABG  [name]

## 2017-06-15 MED FILL — Heparin Sod (Porcine)-NaCl IV Soln 1000 Unit/500ML-0.9%: INTRAVENOUS | Qty: 1000 | Status: AC

## 2017-06-21 ENCOUNTER — Other Ambulatory Visit: Payer: Self-pay | Admitting: *Deleted

## 2017-06-21 ENCOUNTER — Other Ambulatory Visit: Payer: Self-pay

## 2017-06-21 ENCOUNTER — Encounter: Payer: Self-pay | Admitting: Cardiothoracic Surgery

## 2017-06-21 ENCOUNTER — Institutional Professional Consult (permissible substitution): Payer: PPO | Admitting: Cardiothoracic Surgery

## 2017-06-21 VITALS — BP 117/66 | HR 72 | Resp 16 | Ht 70.0 in | Wt 195.0 lb

## 2017-06-21 DIAGNOSIS — Z8249 Family history of ischemic heart disease and other diseases of the circulatory system: Secondary | ICD-10-CM | POA: Diagnosis not present

## 2017-06-21 DIAGNOSIS — I2510 Atherosclerotic heart disease of native coronary artery without angina pectoris: Secondary | ICD-10-CM

## 2017-06-21 DIAGNOSIS — G4733 Obstructive sleep apnea (adult) (pediatric): Secondary | ICD-10-CM

## 2017-06-21 DIAGNOSIS — R9439 Abnormal result of other cardiovascular function study: Secondary | ICD-10-CM

## 2017-06-21 DIAGNOSIS — I208 Other forms of angina pectoris: Secondary | ICD-10-CM

## 2017-06-21 NOTE — Progress Notes (Signed)
PCP is [name], MD Referring Provider is Leonie Man, MD  Chief Complaint  Patient presents with  . Coronary Artery Disease    eval for CABG...CATH 06/14/17 after abnormal nuclear stress test   Patient examined, images of coronary angiograms personally reviewed and counseled with patient HPI: 72 year old nondiabetic reformed smoker presents for evaluation and therapy  for recently diagnosed severe coronary artery disease.  Last month the patient had a extended period of chest pain/pressure which lasted approximately 2 hours and radiated into his right neck and shoulder area.  It did not recur.  Patient mentioned this to Dr. Willey Blade at his annual exam shortly thereafter.  Dr. Willey Blade performed a 12-lead EKG and compared to his baseline and saw some changes.  For that reason the stress test was performed which was positive for ischemic changes.  Dr. Ellyn Hack did a catheterization via right radial artery 1 week ago which showed occlusion of the LAD at its ostium.  The LAD fills via collaterals from the right side as well as the first diagonal which appeared to have a proximal stenosis as well.  LVEDP was normal. Anterior wall was moderately hypokinetic.  Based on the patient's coronary anatomy and recent symptoms surgical coronary revascularization was recommended by his cardiologist. Past Medical History:  Diagnosis Date  . Cancer ([place])    skin  . Enlarged prostate     Past Surgical History:  Procedure Laterality Date  . COLONOSCOPY N/A 07/23/2015   Procedure: COLONOSCOPY;  Surgeon: Daneil Dolin, MD;  Location: [facility];  Service: Endoscopy;  Laterality: N/A;  10:30 Am  . LEFT HEART CATH AND CORONARY ANGIOGRAPHY N/A 06/14/2017   Procedure: LEFT HEART CATH AND CORONARY ANGIOGRAPHY;  Surgeon: Leonie Man, MD;  Location: [facility];  Service: Cardiovascular;  Laterality: N/A;  . Right hand surgery    . Right knee arthroscopy      Family History  Problem Relation Age of  Onset  . Alzheimer's disease Mother   . Arthritis Sister   . Heart attack Brother   . Hypercholesterolemia Sister   . Liver disease Sister     Social History Social History   Tobacco Use  . Smoking status: Former Smoker    Packs/day: 0.50    Years: 20.00    Pack years: 10.00    Types: Cigarettes  . Smokeless tobacco: Current User    Types: Snuff  . Tobacco comment: USING NICORETTE GUM TO TRY AND STOP USING SMOKELESS TOBACCO  Substance Use Topics  . Alcohol use: No  . Drug use: No    Current Outpatient Medications  Medication Sig Dispense Refill  . aspirin EC 81 MG tablet Take 81 mg by mouth daily.    Marland Kitchen atorvastatin (LIPITOR) 80 MG tablet Take 1 tablet (80 mg total) by mouth daily. 30 tablet 11  . finasteride (PROSCAR) 5 MG tablet Take 5 mg by mouth daily.    . metoprolol tartrate (LOPRESSOR) 25 MG tablet Take 1 tablet (25 mg total) by mouth 2 (two) times daily. 60 tablet 6  . Soft Lens Products (SENSITIVE EYES SALINE) SOLN Place 1 drop into both eyes 3 (three) times daily.    . nitroGLYCERIN (NITROSTAT) 0.4 MG SL tablet Place 0.4 mg under the tongue every 5 (five) minutes as needed for chest pain.      No current facility-administered medications for this visit.     No Known Allergies  Review of Systems  Review of Systems :  [ y ] = yes, [  ] = no        General :  Weight gain [   ]    Weight loss  [   ]  Fatigue [  ]  Fever [  ]  Chills  [  ]                                Weakness  [  ]           HEENT    Headache [  ]  Dizziness [  ]  Blurred vision [  ] Glaucoma  [  ]                          Nosebleeds [  ] Painful or loose teeth [ y- recent dental extraction following failed root canal]-        Cardiac :  Chest pain/ pressure Blue.Reese  ]  Resting SOB [  ] exertional SOB [  ]                        Orthopnea [  ]  Pedal edema  [  ]  Palpitations [  ] Syncope/presyncope [ ]                         Paroxysmal nocturnal dyspnea [  ]         Pulmonary : cough [  ]   wheezing [  ]  Hemoptysis [  ] Sputum [  ] Snoring [  ]                              Pneumothorax [  ]  Sleep apnea [ y ]        GI : Vomiting [  ]  Dysphagia [  ]  Melena  [  ]  Abdominal pain [  ] BRBPR [  ]              Heart burn [  ]  Constipation [  ] Diarrhea  [  ] Colonoscopy [   ]        GU : Hematuria [  ]  Dysuria [  ]  Nocturia [  ] UTI's [  ]        Vascular : Claudication [  ]  Rest pain [  ]  DVT [  ] Vein stripping [  ] leg ulcers [  ]                          TIA [  ] Stroke [  ]  Varicose veins [  ]        NEURO :  Headaches  [  ] Seizures [  ] Vision changes [  ] Paresthesias [  ]     right-hand-dominant                                  seizures [  ]        Musculoskeletal :  Arthritis [  ] Gout  [  ]  Back pain [  ]  Joint pain [  ]        Skin :  Rash [  ]  Melanoma [  ] Sores [  ]        Heme : Bleeding problems [  ]Clotting Disorders [  ] Anemia [  ]Blood Transfusion [ ]         Endocrine : Diabetes [  ] Heat or Cold intolerance [  ] Polyuria [  ]excessive thirst [ ]         Psych : Depression [  ]  Anxiety [  ]  Psych hospitalizations [  ] Memory change [  ]                                               BP 117/66 (BP Location: Left Arm, Patient Position: Sitting, Cuff Size: Large)   Pulse 72   Resp 16   Ht 5\' 10"  (1.778 m)   Wt 195 lb (88.5 kg)   SpO2 96% Comment: ON RA  BMI 27.98 kg/m  Physical Exam     Physical Exam  General: Well-nourished 72 year old Caucasian male no acute distress accompanied by his wife HEENT: Normocephalic pupils equal , dentition adequate Neck: Supple without JVD, adenopathy, or bruit Chest: Clear to auscultation, symmetrical breath sounds, no rhonchi, no tenderness             or deformity Cardiovascular: Regular rate and rhythm, no murmur, no gallop, peripheral pulses             palpable in all extremities Abdomen:  Soft, nontender, obese, no palpable mass or organomegaly Extremities: Warm, well-perfused, no clubbing  cyanosis edema or tenderness,              no venous stasis changes of the legs Rectal/GU: Deferred Neuro: Grossly non--focal and symmetrical throughout Skin: Clean and dry without rash or ulceration   Diagnostic Tests: Coronary angiogram show occlusion of the proximal LAD which fills distally via collaterals appears to be an adequate target for bypass grafting.  First diagonal also fills via collaterals and there appears to be a stenosis at its origin from the LAD.  Anterior wall is moderately hypokinetic overall EF 45%.  Impression: Patient would benefit from bypass graft to the LAD and diagonal. Revascularization would improve his survival and improve his symptoms of coronary disease. Plan: CABG x2 schedule  [facility] on May 20.  I discussed the procedure of CABG in detail with the patient and his wife including the indications benefits alternatives and risks.  They understand the potential risks to include stroke, bleeding, blood transfusion, MI, pulmonary problems including pleural effusion, infection, organ failure, and death.  He demonstrates his understanding and agrees to proceed with surgery under what I feel is an informed consent  Len Childs, MD [facility] [phone]

## 2017-06-22 ENCOUNTER — Other Ambulatory Visit: Payer: Self-pay | Admitting: *Deleted

## 2017-06-22 DIAGNOSIS — I2510 Atherosclerotic heart disease of native coronary artery without angina pectoris: Secondary | ICD-10-CM

## 2017-06-23 NOTE — Pre-Procedure Instructions (Addendum)
Chase Guerrero  06/23/2017      [place] [place] [address] Phone: [phone] Fax: [phone]    Your procedure is scheduled on Monday, May 20th   Report to [facility] Admitting at 5:30 AM             (posted surgery time 7:30a - 1:47p)   Call this number if you have problems the morning of surgery:  [phone]   Remember:   No food or drink after midnight, Sunday.    Take these medicines the morning of surgery with A SIP OF WATER :  Finasteride, Metoprolol, Ranitidine.    Do not wear jewelry - no rings or watches.  Do not wear lotions, colognes, or deodorant.   Men may shave face and neck.  Do not bring valuables to the hospital.  [facility] is not responsible for any belongings or valuables.  Contacts, dentures or bridgework may not be worn into surgery.  Leave your suitcase in the car.  After surgery it may be brought to your room.  For patients admitted to the hospital, discharge time will be determined by your treatment team.  Please read over the following fact sheets that you were given. Pain Booklet, Coughing and Deep Breathing, MRSA Information and Surgical Site Infection Prevention    [facility]- Preparing For Surgery  Before surgery, you can play an important role. Because skin is not sterile, your skin needs to be as free of germs as possible. You can reduce the number of germs on your skin by washing with CHG (chlorahexidine gluconate) Soap before surgery.  CHG is an antiseptic cleaner which kills germs and bonds with the skin to continue killing germs even after washing.  Oral Hygiene is also important to reduce your risk of infection.  Remember - BRUSH YOUR TEETH THE MORNING OF SURGERY  Please do not use if you have an allergy to CHG or antibacterial soaps. If your skin becomes reddened/irritated stop using the CHG.  Do not shave (including legs and underarms) for at least 48  hours prior to first CHG shower. It is OK to shave your face.  Please follow these instructions carefully.   1. Shower the NIGHT BEFORE SURGERY and the MORNING OF SURGERY with CHG.   2. If you chose to wash your hair, wash your hair first as usual with your normal shampoo.  3. After you shampoo, rinse your hair and body thoroughly to remove the shampoo.  4. Use CHG as you would any other liquid soap. You can apply CHG directly to the skin and wash gently with a scrungie or a clean washcloth.   5. Apply the CHG Soap to your body ONLY FROM THE NECK DOWN.  Do not use on open wounds or open sores. Avoid contact with your eyes, ears, mouth and genitals (private parts). Wash Face and genitals (private parts)  with your normal soap.  6. Wash thoroughly, paying special attention to the area where your surgery will be performed.  7. Thoroughly rinse your body with warm water from the neck down.  8. DO NOT shower/wash with your normal soap after using and rinsing off the CHG Soap.  9. Pat yourself dry with a CLEAN TOWEL.  10. Wear CLEAN PAJAMAS to bed the night before surgery, wear comfortable clothes the morning of surgery  11. Place CLEAN SHEETS on your bed the night of your first shower  and DO NOT SLEEP WITH PETS.    Day of Surgery:  Do not apply any deodorants/lotions.  Please wear clean clothes to the hospital/surgery center.   Remember to brush your teeth.

## 2017-06-24 ENCOUNTER — Ambulatory Visit (HOSPITAL_COMMUNITY)
Admission: RE | Admit: 2017-06-24 | Discharge: 2017-06-24 | Disposition: A | Discharge status: Home / Self Care | Payer: PPO | Source: Ambulatory Visit | Attending: Cardiothoracic Surgery | Admitting: Cardiothoracic Surgery

## 2017-06-24 ENCOUNTER — Ambulatory Visit (HOSPITAL_BASED_OUTPATIENT_CLINIC_OR_DEPARTMENT_OTHER)
Admission: RE | Admit: 2017-06-24 | Discharge: 2017-06-24 | Disposition: A | Discharge status: Home / Self Care | Payer: PPO | Source: Ambulatory Visit | Attending: Cardiothoracic Surgery | Admitting: Cardiothoracic Surgery

## 2017-06-24 ENCOUNTER — Encounter (HOSPITAL_COMMUNITY): Payer: Self-pay

## 2017-06-24 ENCOUNTER — Other Ambulatory Visit: Payer: Self-pay

## 2017-06-24 ENCOUNTER — Encounter (HOSPITAL_COMMUNITY)
Admission: RE | Admit: 2017-06-24 | Discharge: 2017-06-24 | Disposition: A | Discharge status: Home / Self Care | Payer: PPO | Source: Ambulatory Visit | Attending: Cardiothoracic Surgery | Admitting: Cardiothoracic Surgery

## 2017-06-24 DIAGNOSIS — I252 Old myocardial infarction: Secondary | ICD-10-CM | POA: Diagnosis not present

## 2017-06-24 DIAGNOSIS — Z86010 Personal history of colonic polyps: Secondary | ICD-10-CM | POA: Diagnosis not present

## 2017-06-24 DIAGNOSIS — I2510 Atherosclerotic heart disease of native coronary artery without angina pectoris: Secondary | ICD-10-CM

## 2017-06-24 DIAGNOSIS — J9811 Atelectasis: Secondary | ICD-10-CM | POA: Diagnosis not present

## 2017-06-24 DIAGNOSIS — I10 Essential (primary) hypertension: Secondary | ICD-10-CM | POA: Diagnosis not present

## 2017-06-24 DIAGNOSIS — D62 Acute posthemorrhagic anemia: Secondary | ICD-10-CM | POA: Diagnosis not present

## 2017-06-24 DIAGNOSIS — I083 Combined rheumatic disorders of mitral, aortic and tricuspid valves: Secondary | ICD-10-CM | POA: Diagnosis not present

## 2017-06-24 DIAGNOSIS — F17290 Nicotine dependence, other tobacco product, uncomplicated: Secondary | ICD-10-CM | POA: Diagnosis not present

## 2017-06-24 DIAGNOSIS — N400 Enlarged prostate without lower urinary tract symptoms: Secondary | ICD-10-CM | POA: Diagnosis not present

## 2017-06-24 DIAGNOSIS — Z01818 Encounter for other preprocedural examination: Secondary | ICD-10-CM | POA: Diagnosis not present

## 2017-06-24 DIAGNOSIS — D696 Thrombocytopenia, unspecified: Secondary | ICD-10-CM | POA: Diagnosis not present

## 2017-06-24 DIAGNOSIS — I25110 Atherosclerotic heart disease of native coronary artery with unstable angina pectoris: Secondary | ICD-10-CM | POA: Diagnosis not present

## 2017-06-24 DIAGNOSIS — Z4682 Encounter for fitting and adjustment of non-vascular catheter: Secondary | ICD-10-CM | POA: Diagnosis not present

## 2017-06-24 DIAGNOSIS — I25118 Atherosclerotic heart disease of native coronary artery with other forms of angina pectoris: Secondary | ICD-10-CM | POA: Diagnosis not present

## 2017-06-24 DIAGNOSIS — E8770 Fluid overload, unspecified: Secondary | ICD-10-CM | POA: Diagnosis not present

## 2017-06-24 DIAGNOSIS — D689 Coagulation defect, unspecified: Secondary | ICD-10-CM | POA: Diagnosis not present

## 2017-06-24 DIAGNOSIS — Z79899 Other long term (current) drug therapy: Secondary | ICD-10-CM | POA: Diagnosis not present

## 2017-06-24 DIAGNOSIS — E119 Type 2 diabetes mellitus without complications: Secondary | ICD-10-CM | POA: Diagnosis not present

## 2017-06-24 DIAGNOSIS — G4733 Obstructive sleep apnea (adult) (pediatric): Secondary | ICD-10-CM | POA: Diagnosis not present

## 2017-06-24 DIAGNOSIS — K219 Gastro-esophageal reflux disease without esophagitis: Secondary | ICD-10-CM | POA: Diagnosis not present

## 2017-06-24 DIAGNOSIS — Z951 Presence of aortocoronary bypass graft: Secondary | ICD-10-CM | POA: Diagnosis not present

## 2017-06-24 DIAGNOSIS — J90 Pleural effusion, not elsewhere classified: Secondary | ICD-10-CM | POA: Diagnosis not present

## 2017-06-24 DIAGNOSIS — I480 Paroxysmal atrial fibrillation: Secondary | ICD-10-CM | POA: Diagnosis not present

## 2017-06-24 HISTORY — DX: Gastro-esophageal reflux disease without esophagitis: K21.9

## 2017-06-24 HISTORY — DX: Acute myocardial infarction, unspecified: I21.9

## 2017-06-24 HISTORY — DX: Essential (primary) hypertension: I10

## 2017-06-24 HISTORY — DX: Sleep apnea, unspecified: G47.30

## 2017-06-24 HISTORY — DX: Atherosclerotic heart disease of native coronary artery without angina pectoris: I25.10

## 2017-06-24 LAB — SURGICAL PCR SCREEN
MRSA, PCR: NEGATIVE
Staphylococcus aureus: NEGATIVE

## 2017-06-24 LAB — CBC
HCT: 45.7 % (ref 39.0–52.0)
Hemoglobin: 15.2 g/dL (ref 13.0–17.0)
MCH: 28.3 pg (ref 26.0–34.0)
MCHC: 33.3 g/dL (ref 30.0–36.0)
MCV: 84.9 fL (ref 78.0–100.0)
Platelets: 205 10*3/uL (ref 150–400)
RBC: 5.38 MIL/uL (ref 4.22–5.81)
RDW: 13.6 % (ref 11.5–15.5)
WBC: 10.7 10*3/uL — ABNORMAL HIGH (ref 4.0–10.5)

## 2017-06-24 LAB — BLOOD GAS, ARTERIAL
Acid-Base Excess: 1.6 mmol/L (ref 0.0–2.0)
Bicarbonate: 25.5 mmol/L (ref 20.0–28.0)
Drawn by: 449841
FIO2: 21
O2 Saturation: 97.8 %
Patient temperature: 98.6
pCO2 arterial: 39.2 mmHg (ref 32.0–48.0)
pH, Arterial: 7.429 (ref 7.350–7.450)
pO2, Arterial: 102 mmHg (ref 83.0–108.0)

## 2017-06-24 LAB — URINALYSIS, ROUTINE W REFLEX MICROSCOPIC
Bilirubin Urine: NEGATIVE
Glucose, UA: NEGATIVE mg/dL
Ketones, ur: NEGATIVE mg/dL
Leukocytes, UA: NEGATIVE
Nitrite: NEGATIVE
Protein, ur: NEGATIVE mg/dL
Specific Gravity, Urine: 1.013 (ref 1.005–1.030)
pH: 5 (ref 5.0–8.0)

## 2017-06-24 LAB — PULMONARY FUNCTION TEST
DL/VA % pred: 70 %
DL/VA: 3.26 ml/min/mmHg/L
DLCO cor % pred: 61 %
DLCO cor: 19.95 ml/min/mmHg
DLCO unc % pred: 62 %
DLCO unc: 20.28 ml/min/mmHg
FEF 25-75 Post: 3.09 L/sec
FEF 25-75 Pre: 2.42 L/sec
FEF2575-%Change-Post: 27 %
FEF2575-%Pred-Post: 133 %
FEF2575-%Pred-Pre: 104 %
FEV1-%Change-Post: 5 %
FEV1-%Pred-Post: 104 %
FEV1-%Pred-Pre: 98 %
FEV1-Post: 3.27 L
FEV1-Pre: 3.11 L
FEV1FVC-%Change-Post: 2 %
FEV1FVC-%Pred-Pre: 104 %
FEV6-%Change-Post: 3 %
FEV6-%Pred-Post: 103 %
FEV6-%Pred-Pre: 99 %
FEV6-Post: 4.18 L
FEV6-Pre: 4.05 L
FEV6FVC-%Change-Post: 0 %
FEV6FVC-%Pred-Post: 105 %
FEV6FVC-%Pred-Pre: 105 %
FVC-%Change-Post: 3 %
FVC-%Pred-Post: 97 %
FVC-%Pred-Pre: 94 %
FVC-Post: 4.21 L
FVC-Pre: 4.08 L
Post FEV1/FVC ratio: 78 %
Post FEV6/FVC ratio: 99 %
Pre FEV1/FVC ratio: 76 %
Pre FEV6/FVC Ratio: 99 %
RV % pred: 77 %
RV: 1.94 L
TLC % pred: 86 %
TLC: 6.07 L

## 2017-06-24 LAB — PROTIME-INR
INR: 1.05
Prothrombin Time: 13.6 seconds (ref 11.4–15.2)

## 2017-06-24 LAB — ABO/RH: ABO/RH(D): O POS

## 2017-06-24 LAB — COMPREHENSIVE METABOLIC PANEL
ALT: 15 U/L — ABNORMAL LOW (ref 17–63)
AST: 18 U/L (ref 15–41)
Albumin: 3.4 g/dL — ABNORMAL LOW (ref 3.5–5.0)
Alkaline Phosphatase: 75 U/L (ref 38–126)
Anion gap: 11 (ref 5–15)
BUN: 15 mg/dL (ref 6–20)
CO2: 21 mmol/L — ABNORMAL LOW (ref 22–32)
Calcium: 9.2 mg/dL (ref 8.9–10.3)
Chloride: 106 mmol/L (ref 101–111)
Creatinine, Ser: 1.33 mg/dL — ABNORMAL HIGH (ref 0.61–1.24)
GFR calc Af Amer: 60 mL/min — ABNORMAL LOW (ref >60)
GFR calc non Af Amer: 51 mL/min — ABNORMAL LOW (ref >60)
Glucose, Bld: 128 mg/dL — ABNORMAL HIGH (ref 65–99)
Potassium: 4.2 mmol/L (ref 3.5–5.1)
Sodium: 138 mmol/L (ref 135–145)
Total Bilirubin: 0.6 mg/dL (ref 0.3–1.2)
Total Protein: 5.7 g/dL — ABNORMAL LOW (ref 6.5–8.1)

## 2017-06-24 LAB — APTT: aPTT: 32 seconds (ref 24–36)

## 2017-06-24 LAB — HEMOGLOBIN A1C
Hgb A1c MFr Bld: 5.9 % — ABNORMAL HIGH (ref 4.8–5.6)
Mean Plasma Glucose: 122.63 mg/dL

## 2017-06-24 MED ORDER — POTASSIUM CHLORIDE 2 MEQ/ML IV SOLN
80.0000 meq | INTRAVENOUS | Status: DC
  Filled 2017-06-24 (×2): qty 40

## 2017-06-24 MED ORDER — SODIUM CHLORIDE 0.9 % IV SOLN
1500.0000 mg | INTRAVENOUS | Status: AC
  Administered 2017-06-27: 1500 mg via INTRAVENOUS
  Filled 2017-06-24 (×2): qty 1500

## 2017-06-24 MED ORDER — SODIUM CHLORIDE 0.9 % IV SOLN
30.0000 ug/min | INTRAVENOUS | Status: AC
  Administered 2017-06-27: 25 ug/min via INTRAVENOUS
  Filled 2017-06-24 (×2): qty 2

## 2017-06-24 MED ORDER — INSULIN REGULAR HUMAN 100 UNIT/ML IJ SOLN
INTRAMUSCULAR | Status: AC
  Administered 2017-06-27: .4 [IU]/h via INTRAVENOUS
  Filled 2017-06-24 (×2): qty 1

## 2017-06-24 MED ORDER — NITROGLYCERIN IN D5W 200-5 MCG/ML-% IV SOLN
2.0000 ug/min | INTRAVENOUS | Status: AC
  Administered 2017-06-27: 1.5 ug/min via INTRAVENOUS
  Filled 2017-06-24 (×2): qty 250

## 2017-06-24 MED ORDER — EPINEPHRINE PF 1 MG/ML IJ SOLN
0.0000 ug/min | INTRAVENOUS | Status: DC
  Filled 2017-06-24 (×2): qty 4

## 2017-06-24 MED ORDER — CEFUROXIME SODIUM 1.5 G IV SOLR
1.5000 g | INTRAVENOUS | Status: AC
  Administered 2017-06-27: 1.5 g via INTRAVENOUS
  Administered 2017-06-27: .75 g via INTRAVENOUS
  Filled 2017-06-24 (×2): qty 1.5

## 2017-06-24 MED ORDER — SODIUM CHLORIDE 0.9 % IV SOLN
750.0000 mg | INTRAVENOUS | Status: DC
  Filled 2017-06-24 (×2): qty 750

## 2017-06-24 MED ORDER — TRANEXAMIC ACID (OHS) BOLUS VIA INFUSION
15.0000 mg/kg | INTRAVENOUS | Status: DC
  Filled 2017-06-24: qty 1328

## 2017-06-24 MED ORDER — TRANEXAMIC ACID (OHS) PUMP PRIME SOLUTION
2.0000 mg/kg | INTRAVENOUS | Status: DC
  Filled 2017-06-24 (×2): qty 1.77

## 2017-06-24 MED ORDER — DOPAMINE-DEXTROSE 3.2-5 MG/ML-% IV SOLN
0.0000 ug/kg/min | INTRAVENOUS | Status: AC
  Administered 2017-06-27: 2.5 ug/kg/min via INTRAVENOUS
  Filled 2017-06-24 (×2): qty 250

## 2017-06-24 MED ORDER — TRANEXAMIC ACID 1000 MG/10ML IV SOLN
1.5000 mg/kg/h | INTRAVENOUS | Status: AC
  Administered 2017-06-27: 15 mg/kg/h via INTRAVENOUS
  Filled 2017-06-24 (×2): qty 25

## 2017-06-24 MED ORDER — SODIUM CHLORIDE 0.9 % IV SOLN
INTRAVENOUS | Status: DC
  Filled 2017-06-24 (×2): qty 30

## 2017-06-24 MED ORDER — PAPAVERINE HCL 30 MG/ML IJ SOLN
INTRAMUSCULAR | Status: AC
  Administered 2017-06-27: 500 mL
  Filled 2017-06-24 (×2): qty 2.5

## 2017-06-24 MED ORDER — DEXMEDETOMIDINE HCL IN NACL 400 MCG/100ML IV SOLN
0.1000 ug/kg/h | INTRAVENOUS | Status: AC
  Administered 2017-06-27: .2 ug/kg/h via INTRAVENOUS
  Filled 2017-06-24 (×2): qty 100

## 2017-06-24 MED ORDER — MILRINONE LACTATE IN DEXTROSE 20-5 MG/100ML-% IV SOLN
0.1250 ug/kg/min | INTRAVENOUS | Status: DC
  Filled 2017-06-24 (×2): qty 100

## 2017-06-24 MED ORDER — MAGNESIUM SULFATE 50 % IJ SOLN
40.0000 meq | INTRAMUSCULAR | Status: DC
  Filled 2017-06-24 (×2): qty 9.85

## 2017-06-24 MED ORDER — ALBUTEROL SULFATE (2.5 MG/3ML) 0.083% IN NEBU
2.5000 mg | INHALATION_SOLUTION | Freq: Once | RESPIRATORY_TRACT | Status: AC
  Administered 2017-06-24: 2.5 mg via RESPIRATORY_TRACT

## 2017-06-24 NOTE — Progress Notes (Signed)
Pre-CABG testing has been completed. 1-39% ICA stenosis bilaterally.  ABI's Right 1.16 Left 1.16  06/24/17 11:31 AM Chase Guerrero RVT

## 2017-06-26 NOTE — Anesthesia Preprocedure Evaluation (Addendum)
Anesthesia Evaluation  Patient identified by MRN, date of birth, ID band Patient awake    Reviewed: Allergy & Precautions, NPO status , Patient's Chart, lab work & pertinent test results  History of Anesthesia Complications Negative for: history of anesthetic complications  Airway Mallampati: II  TM Distance: <3 FB Neck ROM: Full    Dental  (+) Teeth Intact   Pulmonary sleep apnea , former smoker,    breath sounds clear to auscultation       Cardiovascular hypertension, + angina + CAD and + Past MI   Rhythm:Regular Rate:Normal     Neuro/Psych    GI/Hepatic Neg liver ROS, GERD  ,  Endo/Other  negative endocrine ROS  Renal/GU negative Renal ROS     Musculoskeletal   Abdominal   Peds  Hematology negative hematology ROS (+)   Anesthesia Other Findings   Reproductive/Obstetrics                            Anesthesia Physical Anesthesia Plan  ASA: III  Anesthesia Plan: General   Post-op Pain Management:    Induction: Intravenous  PONV Risk Score and Plan: 3 and Ondansetron, Dexamethasone and Treatment may vary due to age or medical condition  Airway Management Planned: Oral ETT  Additional Equipment: Arterial line, PA Cath, TEE and Ultrasound Guidance Line Placement  Intra-op Plan:   Post-operative Plan: Post-operative intubation/ventilation  Informed Consent: I have reviewed the patients History and Physical, chart, labs and discussed the procedure including the risks, benefits and alternatives for the proposed anesthesia with the patient or authorized representative who has indicated his/her understanding and acceptance.   Dental advisory given  Plan Discussed with: CRNA  Anesthesia Plan Comments:         Anesthesia Quick Evaluation

## 2017-06-27 ENCOUNTER — Inpatient Hospital Stay (HOSPITAL_COMMUNITY): Payer: PPO | Admitting: Anesthesiology

## 2017-06-27 ENCOUNTER — Inpatient Hospital Stay (HOSPITAL_COMMUNITY)
Admission: RE | Admit: 2017-06-27 | Discharge: 2017-07-04 | DRG: 236 | Disposition: A | Discharge status: Home / Self Care | Payer: PPO | Attending: Cardiothoracic Surgery | Admitting: Cardiothoracic Surgery

## 2017-06-27 ENCOUNTER — Inpatient Hospital Stay (HOSPITAL_COMMUNITY): Payer: PPO

## 2017-06-27 ENCOUNTER — Encounter (HOSPITAL_COMMUNITY): Payer: Self-pay | Admitting: Anesthesiology

## 2017-06-27 ENCOUNTER — Encounter (HOSPITAL_COMMUNITY)
Admission: RE | Disposition: A | Discharge status: Home / Self Care | Payer: Self-pay | Source: Home / Self Care | Attending: Cardiothoracic Surgery

## 2017-06-27 DIAGNOSIS — I25110 Atherosclerotic heart disease of native coronary artery with unstable angina pectoris: Secondary | ICD-10-CM | POA: Diagnosis not present

## 2017-06-27 DIAGNOSIS — I10 Essential (primary) hypertension: Secondary | ICD-10-CM | POA: Diagnosis not present

## 2017-06-27 DIAGNOSIS — Z79899 Other long term (current) drug therapy: Secondary | ICD-10-CM | POA: Diagnosis not present

## 2017-06-27 DIAGNOSIS — I252 Old myocardial infarction: Secondary | ICD-10-CM

## 2017-06-27 DIAGNOSIS — Z951 Presence of aortocoronary bypass graft: Secondary | ICD-10-CM

## 2017-06-27 DIAGNOSIS — D689 Coagulation defect, unspecified: Secondary | ICD-10-CM | POA: Diagnosis not present

## 2017-06-27 DIAGNOSIS — Z86010 Personal history of colonic polyps: Secondary | ICD-10-CM

## 2017-06-27 DIAGNOSIS — D696 Thrombocytopenia, unspecified: Secondary | ICD-10-CM | POA: Diagnosis not present

## 2017-06-27 DIAGNOSIS — Z4682 Encounter for fitting and adjustment of non-vascular catheter: Secondary | ICD-10-CM | POA: Diagnosis not present

## 2017-06-27 DIAGNOSIS — N400 Enlarged prostate without lower urinary tract symptoms: Secondary | ICD-10-CM | POA: Diagnosis present

## 2017-06-27 DIAGNOSIS — I480 Paroxysmal atrial fibrillation: Secondary | ICD-10-CM | POA: Diagnosis not present

## 2017-06-27 DIAGNOSIS — K219 Gastro-esophageal reflux disease without esophagitis: Secondary | ICD-10-CM | POA: Diagnosis not present

## 2017-06-27 DIAGNOSIS — I2510 Atherosclerotic heart disease of native coronary artery without angina pectoris: Secondary | ICD-10-CM

## 2017-06-27 DIAGNOSIS — G4733 Obstructive sleep apnea (adult) (pediatric): Secondary | ICD-10-CM | POA: Diagnosis not present

## 2017-06-27 DIAGNOSIS — J9811 Atelectasis: Secondary | ICD-10-CM | POA: Diagnosis not present

## 2017-06-27 DIAGNOSIS — I083 Combined rheumatic disorders of mitral, aortic and tricuspid valves: Secondary | ICD-10-CM | POA: Diagnosis not present

## 2017-06-27 DIAGNOSIS — D62 Acute posthemorrhagic anemia: Secondary | ICD-10-CM | POA: Diagnosis not present

## 2017-06-27 DIAGNOSIS — E119 Type 2 diabetes mellitus without complications: Secondary | ICD-10-CM | POA: Diagnosis present

## 2017-06-27 DIAGNOSIS — E8770 Fluid overload, unspecified: Secondary | ICD-10-CM | POA: Diagnosis present

## 2017-06-27 DIAGNOSIS — F17290 Nicotine dependence, other tobacco product, uncomplicated: Secondary | ICD-10-CM | POA: Diagnosis present

## 2017-06-27 DIAGNOSIS — J90 Pleural effusion, not elsewhere classified: Secondary | ICD-10-CM | POA: Diagnosis not present

## 2017-06-27 DIAGNOSIS — I25118 Atherosclerotic heart disease of native coronary artery with other forms of angina pectoris: Secondary | ICD-10-CM | POA: Diagnosis not present

## 2017-06-27 HISTORY — PX: TEE WITHOUT CARDIOVERSION: SHX5443

## 2017-06-27 HISTORY — PX: CORONARY ARTERY BYPASS GRAFT: SHX141

## 2017-06-27 LAB — POCT I-STAT 3, ART BLOOD GAS (G3+)
Acid-Base Excess: 2 mmol/L (ref 0.0–2.0)
Acid-base deficit: 2 mmol/L (ref 0.0–2.0)
Acid-base deficit: 3 mmol/L — ABNORMAL HIGH (ref 0.0–2.0)
Acid-base deficit: 3 mmol/L — ABNORMAL HIGH (ref 0.0–2.0)
Bicarbonate: 25.5 mmol/L (ref 20.0–28.0)
Bicarbonate: 24.3 mmol/L (ref 20.0–28.0)
Bicarbonate: 22.2 mmol/L (ref 20.0–28.0)
Bicarbonate: 22.4 mmol/L (ref 20.0–28.0)
Bicarbonate: 22 mmol/L (ref 20.0–28.0)
O2 Saturation: 100 %
O2 Saturation: 100 %
O2 Saturation: 98 %
O2 Saturation: 99 %
O2 Saturation: 99 %
Patient temperature: 35.6
Patient temperature: 37.2
TCO2: 27 mmol/L (ref 22–32)
TCO2: 25 mmol/L (ref 22–32)
TCO2: 23 mmol/L (ref 22–32)
TCO2: 24 mmol/L (ref 22–32)
TCO2: 23 mmol/L (ref 22–32)
pCO2 arterial: 33.9 mmHg (ref 32.0–48.0)
pCO2 arterial: 38.1 mmHg (ref 32.0–48.0)
pCO2 arterial: 33.7 mmHg (ref 32.0–48.0)
pCO2 arterial: 40.5 mmHg (ref 32.0–48.0)
pCO2 arterial: 39.9 mmHg (ref 32.0–48.0)
pH, Arterial: 7.484 — ABNORMAL HIGH (ref 7.350–7.450)
pH, Arterial: 7.413 (ref 7.350–7.450)
pH, Arterial: 7.42 (ref 7.350–7.450)
pH, Arterial: 7.351 (ref 7.350–7.450)
pH, Arterial: 7.35 (ref 7.350–7.450)
pO2, Arterial: 445 mmHg — ABNORMAL HIGH (ref 83.0–108.0)
pO2, Arterial: 243 mmHg — ABNORMAL HIGH (ref 83.0–108.0)
pO2, Arterial: 103 mmHg (ref 83.0–108.0)
pO2, Arterial: 135 mmHg — ABNORMAL HIGH (ref 83.0–108.0)
pO2, Arterial: 156 mmHg — ABNORMAL HIGH (ref 83.0–108.0)

## 2017-06-27 LAB — POCT I-STAT 4, (NA,K, GLUC, HGB,HCT)
Glucose, Bld: 147 mg/dL — ABNORMAL HIGH (ref 65–99)
HCT: 28 % — ABNORMAL LOW (ref 39.0–52.0)
Hemoglobin: 9.5 g/dL — ABNORMAL LOW (ref 13.0–17.0)
Potassium: 4.4 mmol/L (ref 3.5–5.1)
Sodium: 140 mmol/L (ref 135–145)

## 2017-06-27 LAB — CBC
HCT: 22.4 % — ABNORMAL LOW (ref 39.0–52.0)
HEMATOCRIT: 28.8 % — AB (ref 39.0–52.0)
HEMOGLOBIN: 9.7 g/dL — AB (ref 13.0–17.0)
Hemoglobin: 7.6 g/dL — ABNORMAL LOW (ref 13.0–17.0)
MCH: 28.4 pg (ref 26.0–34.0)
MCH: 28.7 pg (ref 26.0–34.0)
MCHC: 33.7 g/dL (ref 30.0–36.0)
MCHC: 33.9 g/dL (ref 30.0–36.0)
MCV: 84.5 fL (ref 78.0–100.0)
MCV: 84.5 fL (ref 78.0–100.0)
Platelets: 81 10*3/uL — ABNORMAL LOW (ref 150–400)
Platelets: 128 10*3/uL — ABNORMAL LOW (ref 150–400)
RBC: 3.41 MIL/uL — AB (ref 4.22–5.81)
RBC: 2.65 MIL/uL — ABNORMAL LOW (ref 4.22–5.81)
RDW: 13.4 % (ref 11.5–15.5)
RDW: 13.3 % (ref 11.5–15.5)
WBC: 21.9 10*3/uL — ABNORMAL HIGH (ref 4.0–10.5)
WBC: 10.4 10*3/uL (ref 4.0–10.5)

## 2017-06-27 LAB — POCT I-STAT, CHEM 8
BUN: 17 mg/dL (ref 6–20)
BUN: 11 mg/dL (ref 6–20)
BUN: 15 mg/dL (ref 6–20)
BUN: 16 mg/dL (ref 6–20)
BUN: 15 mg/dL (ref 6–20)
BUN: 13 mg/dL (ref 6–20)
Calcium, Ion: 1.27 mmol/L (ref 1.15–1.40)
Calcium, Ion: 0.77 mmol/L — CL (ref 1.15–1.40)
Calcium, Ion: 1.2 mmol/L (ref 1.15–1.40)
Calcium, Ion: 1.01 mmol/L — ABNORMAL LOW (ref 1.15–1.40)
Calcium, Ion: 1.07 mmol/L — ABNORMAL LOW (ref 1.15–1.40)
Calcium, Ion: 1.28 mmol/L (ref 1.15–1.40)
Chloride: 103 mmol/L (ref 101–111)
Chloride: 104 mmol/L (ref 101–111)
Chloride: 98 mmol/L — ABNORMAL LOW (ref 101–111)
Chloride: 101 mmol/L (ref 101–111)
Chloride: 103 mmol/L (ref 101–111)
Chloride: 104 mmol/L (ref 101–111)
Creatinine, Ser: 1 mg/dL (ref 0.61–1.24)
Creatinine, Ser: 0.6 mg/dL — ABNORMAL LOW (ref 0.61–1.24)
Creatinine, Ser: 1.1 mg/dL (ref 0.61–1.24)
Creatinine, Ser: 0.9 mg/dL (ref 0.61–1.24)
Creatinine, Ser: 0.9 mg/dL (ref 0.61–1.24)
Creatinine, Ser: 1.1 mg/dL (ref 0.61–1.24)
Glucose, Bld: 102 mg/dL — ABNORMAL HIGH (ref 65–99)
Glucose, Bld: 109 mg/dL — ABNORMAL HIGH (ref 65–99)
Glucose, Bld: 87 mg/dL (ref 65–99)
Glucose, Bld: 125 mg/dL — ABNORMAL HIGH (ref 65–99)
Glucose, Bld: 112 mg/dL — ABNORMAL HIGH (ref 65–99)
Glucose, Bld: 134 mg/dL — ABNORMAL HIGH (ref 65–99)
HCT: 35 % — ABNORMAL LOW (ref 39.0–52.0)
HCT: 25 % — ABNORMAL LOW (ref 39.0–52.0)
HCT: 32 % — ABNORMAL LOW (ref 39.0–52.0)
HCT: 26 % — ABNORMAL LOW (ref 39.0–52.0)
HCT: 26 % — ABNORMAL LOW (ref 39.0–52.0)
HCT: 27 % — ABNORMAL LOW (ref 39.0–52.0)
Hemoglobin: 11.9 g/dL — ABNORMAL LOW (ref 13.0–17.0)
Hemoglobin: 10.9 g/dL — ABNORMAL LOW (ref 13.0–17.0)
Hemoglobin: 8.5 g/dL — ABNORMAL LOW (ref 13.0–17.0)
Hemoglobin: 8.8 g/dL — ABNORMAL LOW (ref 13.0–17.0)
Hemoglobin: 8.8 g/dL — ABNORMAL LOW (ref 13.0–17.0)
Hemoglobin: 9.2 g/dL — ABNORMAL LOW (ref 13.0–17.0)
Potassium: 4.2 mmol/L (ref 3.5–5.1)
Potassium: 4.3 mmol/L (ref 3.5–5.1)
Potassium: 4.3 mmol/L (ref 3.5–5.1)
Potassium: 5.1 mmol/L (ref 3.5–5.1)
Potassium: 4.4 mmol/L (ref 3.5–5.1)
Potassium: 3.8 mmol/L (ref 3.5–5.1)
Sodium: 139 mmol/L (ref 135–145)
Sodium: 138 mmol/L (ref 135–145)
Sodium: 141 mmol/L (ref 135–145)
Sodium: 137 mmol/L (ref 135–145)
Sodium: 140 mmol/L (ref 135–145)
Sodium: 140 mmol/L (ref 135–145)
TCO2: 27 mmol/L (ref 22–32)
TCO2: 24 mmol/L (ref 22–32)
TCO2: 25 mmol/L (ref 22–32)
TCO2: 28 mmol/L (ref 22–32)
TCO2: 24 mmol/L (ref 22–32)
TCO2: 24 mmol/L (ref 22–32)

## 2017-06-27 LAB — GLUCOSE, CAPILLARY
Glucose-Capillary: 161 mg/dL — ABNORMAL HIGH (ref 65–99)
Glucose-Capillary: 165 mg/dL — ABNORMAL HIGH (ref 65–99)
Glucose-Capillary: 150 mg/dL — ABNORMAL HIGH (ref 65–99)
Glucose-Capillary: 132 mg/dL — ABNORMAL HIGH (ref 65–99)
Glucose-Capillary: 132 mg/dL — ABNORMAL HIGH (ref 65–99)
Glucose-Capillary: 143 mg/dL — ABNORMAL HIGH (ref 65–99)
Glucose-Capillary: 126 mg/dL — ABNORMAL HIGH (ref 65–99)
Glucose-Capillary: 129 mg/dL — ABNORMAL HIGH (ref 65–99)
Glucose-Capillary: 120 mg/dL — ABNORMAL HIGH (ref 65–99)

## 2017-06-27 LAB — APTT: aPTT: 34 seconds (ref 24–36)

## 2017-06-27 LAB — PLATELET COUNT: Platelets: 71 10*3/uL — ABNORMAL LOW (ref 150–400)

## 2017-06-27 LAB — CREATININE, SERUM
Creatinine, Ser: 1.21 mg/dL (ref 0.61–1.24)
GFR calc Af Amer: >60 mL/min (ref >60)
GFR calc non Af Amer: 58 mL/min — ABNORMAL LOW (ref >60)

## 2017-06-27 LAB — MAGNESIUM: Magnesium: 2.7 mg/dL — ABNORMAL HIGH (ref 1.7–2.4)

## 2017-06-27 LAB — PROTIME-INR
INR: 1.7
PROTHROMBIN TIME: 19.8 s — AB (ref 11.4–15.2)

## 2017-06-27 LAB — HEMOGLOBIN AND HEMATOCRIT, BLOOD
HCT: 27.7 % — ABNORMAL LOW (ref 39.0–52.0)
Hemoglobin: 9.4 g/dL — ABNORMAL LOW (ref 13.0–17.0)

## 2017-06-27 LAB — PREPARE RBC (CROSSMATCH)

## 2017-06-27 SURGERY — CORONARY ARTERY BYPASS GRAFTING (CABG)
Anesthesia: General | Site: Chest

## 2017-06-27 MED ORDER — CHLORHEXIDINE GLUCONATE 4 % EX LIQD: 30.0000 mL | CUTANEOUS | Status: DC

## 2017-06-27 MED ORDER — FENTANYL CITRATE (PF) 250 MCG/5ML IJ SOLN
INTRAMUSCULAR | Status: AC
  Filled 2017-06-27: qty 25

## 2017-06-27 MED ORDER — HEPARIN SODIUM (PORCINE) 1000 UNIT/ML IJ SOLN
INTRAMUSCULAR | Status: DC | PRN
  Administered 2017-06-27: 29000 [IU] via INTRAVENOUS
  Administered 2017-06-27: 2000 [IU] via INTRAVENOUS

## 2017-06-27 MED ORDER — LACTATED RINGERS IV SOLN
500.0000 mL | Freq: Once | INTRAVENOUS | Status: AC | PRN
  Administered 2017-06-27: 500 mL via INTRAVENOUS

## 2017-06-27 MED ORDER — ASPIRIN 81 MG PO CHEW: 324.0000 mg | CHEWABLE_TABLET | Freq: Every day | ORAL | Status: DC

## 2017-06-27 MED ORDER — HEMOSTATIC AGENTS (NO CHARGE) OPTIME
TOPICAL | Status: DC | PRN
  Administered 2017-06-27: 1 via TOPICAL

## 2017-06-27 MED ORDER — MIDAZOLAM HCL 10 MG/2ML IJ SOLN
INTRAMUSCULAR | Status: AC
  Filled 2017-06-27: qty 2

## 2017-06-27 MED ORDER — LACTATED RINGERS IV SOLN
INTRAVENOUS | Status: DC | PRN
  Administered 2017-06-27 (×2): via INTRAVENOUS

## 2017-06-27 MED ORDER — MILRINONE LACTATE IN DEXTROSE 20-5 MG/100ML-% IV SOLN
0.1250 ug/kg/min | INTRAVENOUS | Status: DC
  Administered 2017-06-27: .25 ug/kg/min via INTRAVENOUS
  Administered 2017-06-27 – 2017-06-29 (×3): 0.125 ug/kg/min via INTRAVENOUS
  Filled 2017-06-27 (×2): qty 100

## 2017-06-27 MED ORDER — HEMOSTATIC AGENTS (NO CHARGE) OPTIME
TOPICAL | Status: DC | PRN
  Administered 2017-06-27 (×2): 1 via TOPICAL

## 2017-06-27 MED ORDER — ATORVASTATIN CALCIUM 80 MG PO TABS
80.0000 mg | ORAL_TABLET | Freq: Every day | ORAL | Status: DC
  Administered 2017-06-28 – 2017-07-03 (×6): 80 mg via ORAL
  Filled 2017-06-27 (×6): qty 1

## 2017-06-27 MED ORDER — ORAL CARE MOUTH RINSE
15.0000 mL | Freq: Four times a day (QID) | OROMUCOSAL | Status: DC
  Administered 2017-06-27 – 2017-06-28 (×5): 15 mL via OROMUCOSAL

## 2017-06-27 MED ORDER — ONDANSETRON HCL 4 MG/2ML IJ SOLN
INTRAMUSCULAR | Status: DC | PRN
  Administered 2017-06-27: 4 mg via INTRAVENOUS

## 2017-06-27 MED ORDER — OXYCODONE HCL 5 MG PO TABS
5.0000 mg | ORAL_TABLET | ORAL | Status: DC | PRN
  Administered 2017-06-27 – 2017-06-29 (×5): 5 mg via ORAL
  Filled 2017-06-27 (×5): qty 1

## 2017-06-27 MED ORDER — DOPAMINE-DEXTROSE 3.2-5 MG/ML-% IV SOLN
3.0000 ug/kg/min | INTRAVENOUS | Status: DC
  Administered 2017-06-27 – 2017-06-28 (×2): 2.5 ug/kg/min via INTRAVENOUS

## 2017-06-27 MED ORDER — FINASTERIDE 5 MG PO TABS
5.0000 mg | ORAL_TABLET | Freq: Every day | ORAL | Status: DC
  Administered 2017-06-28 – 2017-07-04 (×7): 5 mg via ORAL
  Filled 2017-06-27 (×7): qty 1

## 2017-06-27 MED ORDER — ARTIFICIAL TEARS OPHTHALMIC OINT
TOPICAL_OINTMENT | OPHTHALMIC | Status: DC | PRN
  Administered 2017-06-27: 1 via OPHTHALMIC

## 2017-06-27 MED ORDER — DEXAMETHASONE SODIUM PHOSPHATE 10 MG/ML IJ SOLN
INTRAMUSCULAR | Status: DC | PRN
  Administered 2017-06-27: 10 mg via INTRAVENOUS

## 2017-06-27 MED ORDER — METOCLOPRAMIDE HCL 5 MG/ML IJ SOLN
10.0000 mg | Freq: Four times a day (QID) | INTRAMUSCULAR | Status: AC
  Administered 2017-06-27 – 2017-07-02 (×20): 10 mg via INTRAVENOUS
  Filled 2017-06-27 (×20): qty 2

## 2017-06-27 MED ORDER — TRAMADOL HCL 50 MG PO TABS: 50.0000 mg | ORAL_TABLET | ORAL | Status: DC | PRN

## 2017-06-27 MED ORDER — SODIUM CHLORIDE 0.9% FLUSH: 10.0000 mL | INTRAVENOUS | Status: DC | PRN

## 2017-06-27 MED ORDER — CALCIUM CHLORIDE 10 % IV SOLN
1.0000 g | Freq: Once | INTRAVENOUS | Status: AC
  Administered 2017-06-27: 1 g via INTRAVENOUS

## 2017-06-27 MED ORDER — MAGNESIUM SULFATE 4 GM/100ML IV SOLN
4.0000 g | Freq: Once | INTRAVENOUS | Status: AC
  Administered 2017-06-27: 4 g via INTRAVENOUS
  Filled 2017-06-27: qty 100

## 2017-06-27 MED ORDER — PROTAMINE SULFATE 10 MG/ML IV SOLN
INTRAVENOUS | Status: DC | PRN
  Administered 2017-06-27 (×5): 50 mg via INTRAVENOUS
  Administered 2017-06-27: 60 mg via INTRAVENOUS

## 2017-06-27 MED ORDER — SODIUM CHLORIDE 0.9% FLUSH: 3.0000 mL | INTRAVENOUS | Status: DC | PRN

## 2017-06-27 MED ORDER — DOCUSATE SODIUM 100 MG PO CAPS
200.0000 mg | ORAL_CAPSULE | Freq: Every day | ORAL | Status: DC
  Administered 2017-06-28 – 2017-07-04 (×5): 200 mg via ORAL
  Filled 2017-06-27 (×6): qty 2

## 2017-06-27 MED ORDER — PROPOFOL 10 MG/ML IV BOLUS
INTRAVENOUS | Status: DC | PRN
  Administered 2017-06-27: 50 mg via INTRAVENOUS

## 2017-06-27 MED ORDER — LACTATED RINGERS IV SOLN
INTRAVENOUS | Status: DC
  Administered 2017-06-28: 20 mL/h via INTRAVENOUS

## 2017-06-27 MED ORDER — MIDAZOLAM HCL 2 MG/2ML IJ SOLN
INTRAMUSCULAR | Status: AC
  Filled 2017-06-27: qty 2

## 2017-06-27 MED ORDER — CHLORHEXIDINE GLUCONATE 0.12 % MT SOLN
15.0000 mL | OROMUCOSAL | Status: AC
  Administered 2017-06-27: 15 mL via OROMUCOSAL

## 2017-06-27 MED ORDER — ACETAMINOPHEN 650 MG RE SUPP
650.0000 mg | Freq: Once | RECTAL | Status: AC
  Administered 2017-06-27: 650 mg via RECTAL

## 2017-06-27 MED ORDER — ONDANSETRON HCL 4 MG/2ML IJ SOLN
INTRAMUSCULAR | Status: AC
  Filled 2017-06-27: qty 2

## 2017-06-27 MED ORDER — ARTIFICIAL TEARS OPHTHALMIC OINT
TOPICAL_OINTMENT | OPHTHALMIC | Status: AC
  Filled 2017-06-27: qty 3.5

## 2017-06-27 MED ORDER — SODIUM CHLORIDE 0.45 % IV SOLN
INTRAVENOUS | Status: DC | PRN
  Administered 2017-06-27: 13:00:00 via INTRAVENOUS

## 2017-06-27 MED ORDER — 0.9 % SODIUM CHLORIDE (POUR BTL) OPTIME
TOPICAL | Status: DC | PRN
  Administered 2017-06-27: 5000 mL

## 2017-06-27 MED ORDER — CHLORHEXIDINE GLUCONATE CLOTH 2 % EX PADS
6.0000 | MEDICATED_PAD | Freq: Every day | CUTANEOUS | Status: DC
  Administered 2017-06-27 – 2017-06-28 (×2): 6 via TOPICAL

## 2017-06-27 MED ORDER — METOPROLOL TARTRATE 5 MG/5ML IV SOLN
2.5000 mg | INTRAVENOUS | Status: DC | PRN
  Administered 2017-06-29: 2.5 mg via INTRAVENOUS
  Filled 2017-06-27: qty 5

## 2017-06-27 MED ORDER — DEXMEDETOMIDINE HCL IN NACL 200 MCG/50ML IV SOLN
0.0000 ug/kg/h | INTRAVENOUS | Status: DC
  Administered 2017-06-27: 0.4 ug/kg/h via INTRAVENOUS
  Filled 2017-06-27: qty 50

## 2017-06-27 MED ORDER — ONDANSETRON HCL 4 MG/2ML IJ SOLN
4.0000 mg | Freq: Four times a day (QID) | INTRAMUSCULAR | Status: DC | PRN
  Administered 2017-06-28: 4 mg via INTRAVENOUS
  Filled 2017-06-27: qty 2

## 2017-06-27 MED ORDER — ALBUMIN HUMAN 5 % IV SOLN
INTRAVENOUS | Status: AC
  Administered 2017-06-27: 250 mL via INTRAVENOUS
  Filled 2017-06-27: qty 250

## 2017-06-27 MED ORDER — ACETAMINOPHEN 500 MG PO TABS
1000.0000 mg | ORAL_TABLET | Freq: Four times a day (QID) | ORAL | Status: AC
  Administered 2017-06-27 – 2017-07-02 (×18): 1000 mg via ORAL
  Filled 2017-06-27 (×19): qty 2

## 2017-06-27 MED ORDER — CALCIUM CHLORIDE 10 % IV SOLN
INTRAVENOUS | Status: DC | PRN
  Administered 2017-06-27: 100 mg via INTRAVENOUS

## 2017-06-27 MED ORDER — DESMOPRESSIN ACETATE 4 MCG/ML IJ SOLN
20.0000 ug | INTRAMUSCULAR | Status: AC
  Administered 2017-06-27: 20 ug via INTRAVENOUS
  Filled 2017-06-27: qty 5

## 2017-06-27 MED ORDER — MIDAZOLAM HCL 2 MG/2ML IJ SOLN: 2.0000 mg | INTRAMUSCULAR | Status: DC | PRN

## 2017-06-27 MED ORDER — SODIUM CHLORIDE 0.9% FLUSH
10.0000 mL | Freq: Two times a day (BID) | INTRAVENOUS | Status: DC
  Administered 2017-06-27 – 2017-06-30 (×4): 10 mL

## 2017-06-27 MED ORDER — BISACODYL 10 MG RE SUPP
10.0000 mg | Freq: Every day | RECTAL | Status: DC
  Filled 2017-06-27: qty 1

## 2017-06-27 MED ORDER — BISACODYL 5 MG PO TBEC
10.0000 mg | DELAYED_RELEASE_TABLET | Freq: Every day | ORAL | Status: DC
  Administered 2017-06-28 – 2017-07-04 (×5): 10 mg via ORAL
  Filled 2017-06-27 (×6): qty 2

## 2017-06-27 MED ORDER — POTASSIUM CHLORIDE 10 MEQ/50ML IV SOLN: 10.0000 meq | INTRAVENOUS | Status: AC

## 2017-06-27 MED ORDER — LACTATED RINGERS IV SOLN: INTRAVENOUS | Status: DC

## 2017-06-27 MED ORDER — METOPROLOL TARTRATE 12.5 MG HALF TABLET: 12.5000 mg | ORAL_TABLET | Freq: Once | ORAL | Status: DC

## 2017-06-27 MED ORDER — ASPIRIN EC 325 MG PO TBEC: 325.0000 mg | DELAYED_RELEASE_TABLET | Freq: Every day | ORAL | Status: DC

## 2017-06-27 MED ORDER — MIDAZOLAM HCL 5 MG/5ML IJ SOLN
INTRAMUSCULAR | Status: DC | PRN
  Administered 2017-06-27: 1 mg via INTRAVENOUS
  Administered 2017-06-27: 2 mg via INTRAVENOUS
  Administered 2017-06-27: 4 mg via INTRAVENOUS
  Administered 2017-06-27: 3 mg via INTRAVENOUS
  Administered 2017-06-27: 2 mg via INTRAVENOUS

## 2017-06-27 MED ORDER — HEMOSTATIC AGENTS (NO CHARGE) OPTIME
TOPICAL | Status: DC | PRN
  Administered 2017-06-27 (×4): 1 via TOPICAL

## 2017-06-27 MED ORDER — ACETAMINOPHEN 160 MG/5ML PO SOLN: 1000.0000 mg | Freq: Four times a day (QID) | ORAL | Status: AC

## 2017-06-27 MED ORDER — ALBUMIN HUMAN 5 % IV SOLN
INTRAVENOUS | Status: DC | PRN
  Administered 2017-06-27 (×2): via INTRAVENOUS

## 2017-06-27 MED ORDER — SODIUM CHLORIDE 0.9 % IV SOLN
INTRAVENOUS | Status: DC
  Filled 2017-06-27: qty 1

## 2017-06-27 MED ORDER — LACTATED RINGERS IV SOLN
INTRAVENOUS | Status: DC | PRN
  Administered 2017-06-27: 07:00:00 via INTRAVENOUS

## 2017-06-27 MED ORDER — SODIUM CHLORIDE 0.9 % IV SOLN
0.0000 ug/min | INTRAVENOUS | Status: DC
  Administered 2017-06-27: 65 ug/min via INTRAVENOUS
  Filled 2017-06-27: qty 20
  Filled 2017-06-27: qty 2

## 2017-06-27 MED ORDER — NITROGLYCERIN IN D5W 200-5 MCG/ML-% IV SOLN: 0.0000 ug/min | INTRAVENOUS | Status: DC

## 2017-06-27 MED ORDER — SODIUM CHLORIDE 0.9 % IV SOLN
250.0000 mL | INTRAVENOUS | Status: DC
  Administered 2017-06-28: 250 mL via INTRAVENOUS

## 2017-06-27 MED ORDER — SODIUM CHLORIDE 0.9% FLUSH
3.0000 mL | Freq: Two times a day (BID) | INTRAVENOUS | Status: DC
  Administered 2017-06-28 – 2017-07-03 (×6): 3 mL via INTRAVENOUS

## 2017-06-27 MED ORDER — HEPARIN SODIUM (PORCINE) 1000 UNIT/ML IJ SOLN
INTRAMUSCULAR | Status: AC
  Filled 2017-06-27: qty 1

## 2017-06-27 MED ORDER — LACTATED RINGERS IV SOLN: INTRAVENOUS | Status: DC | PRN

## 2017-06-27 MED ORDER — CALCIUM CHLORIDE 10 % IV SOLN
1.0000 g | Freq: Once | INTRAVENOUS | Status: DC
  Filled 2017-06-27: qty 10

## 2017-06-27 MED ORDER — SODIUM CHLORIDE 0.9 % IJ SOLN
INTRAMUSCULAR | Status: AC
  Filled 2017-06-27: qty 10

## 2017-06-27 MED ORDER — FAMOTIDINE IN NACL 20-0.9 MG/50ML-% IV SOLN
20.0000 mg | Freq: Two times a day (BID) | INTRAVENOUS | Status: AC
  Administered 2017-06-27 (×2): 20 mg via INTRAVENOUS
  Filled 2017-06-27: qty 50

## 2017-06-27 MED ORDER — SODIUM CHLORIDE 0.9 % IV SOLN
1.5000 g | Freq: Two times a day (BID) | INTRAVENOUS | Status: AC
  Administered 2017-06-27 – 2017-06-29 (×4): 1.5 g via INTRAVENOUS
  Filled 2017-06-27 (×4): qty 1.5

## 2017-06-27 MED ORDER — CALCIUM CHLORIDE 10 % IV SOLN
INTRAVENOUS | Status: AC
  Filled 2017-06-27: qty 10

## 2017-06-27 MED ORDER — PANTOPRAZOLE SODIUM 40 MG PO TBEC
40.0000 mg | DELAYED_RELEASE_TABLET | Freq: Every day | ORAL | Status: DC
  Administered 2017-06-29 – 2017-07-04 (×6): 40 mg via ORAL
  Filled 2017-06-27 (×6): qty 1

## 2017-06-27 MED ORDER — DEXAMETHASONE SODIUM PHOSPHATE 10 MG/ML IJ SOLN
INTRAMUSCULAR | Status: AC
  Filled 2017-06-27: qty 1

## 2017-06-27 MED ORDER — INSULIN REGULAR BOLUS VIA INFUSION
0.0000 [IU] | Freq: Three times a day (TID) | INTRAVENOUS | Status: DC
  Filled 2017-06-27: qty 10

## 2017-06-27 MED ORDER — ACETAMINOPHEN 160 MG/5ML PO SOLN: 650.0000 mg | Freq: Once | ORAL | Status: AC

## 2017-06-27 MED ORDER — PROTAMINE SULFATE 10 MG/ML IV SOLN
INTRAVENOUS | Status: AC
  Filled 2017-06-27: qty 25

## 2017-06-27 MED ORDER — CHLORHEXIDINE GLUCONATE 0.12 % MT SOLN
15.0000 mL | Freq: Once | OROMUCOSAL | Status: AC
  Administered 2017-06-27: 15 mL via OROMUCOSAL
  Filled 2017-06-27: qty 15

## 2017-06-27 MED ORDER — ROCURONIUM BROMIDE 10 MG/ML (PF) SYRINGE
PREFILLED_SYRINGE | INTRAVENOUS | Status: DC | PRN
  Administered 2017-06-27: 30 mg via INTRAVENOUS
  Administered 2017-06-27: 50 mg via INTRAVENOUS
  Administered 2017-06-27: 70 mg via INTRAVENOUS

## 2017-06-27 MED ORDER — METOPROLOL TARTRATE 25 MG/10 ML ORAL SUSPENSION: 12.5000 mg | Freq: Two times a day (BID) | ORAL | Status: DC

## 2017-06-27 MED ORDER — ALBUMIN HUMAN 5 % IV SOLN
250.0000 mL | INTRAVENOUS | Status: DC | PRN
  Administered 2017-06-27: 250 mL via INTRAVENOUS

## 2017-06-27 MED ORDER — MORPHINE SULFATE (PF) 2 MG/ML IV SOLN: 2.0000 mg | INTRAVENOUS | Status: DC | PRN

## 2017-06-27 MED ORDER — VANCOMYCIN HCL IN DEXTROSE 1-5 GM/200ML-% IV SOLN
1000.0000 mg | Freq: Once | INTRAVENOUS | Status: AC
  Administered 2017-06-27: 1000 mg via INTRAVENOUS
  Filled 2017-06-27: qty 200

## 2017-06-27 MED ORDER — SODIUM CHLORIDE 0.9 % IV SOLN
INTRAVENOUS | Status: DC
  Administered 2017-06-27: 13:00:00 via INTRAVENOUS

## 2017-06-27 MED ORDER — FENTANYL CITRATE (PF) 250 MCG/5ML IJ SOLN
INTRAMUSCULAR | Status: DC | PRN
  Administered 2017-06-27: 100 ug via INTRAVENOUS
  Administered 2017-06-27: 750 ug via INTRAVENOUS
  Administered 2017-06-27 (×5): 100 ug via INTRAVENOUS
  Administered 2017-06-27: 50 ug via INTRAVENOUS
  Administered 2017-06-27: 100 ug via INTRAVENOUS

## 2017-06-27 MED ORDER — CHLORHEXIDINE GLUCONATE 0.12% ORAL RINSE (MEDLINE KIT)
15.0000 mL | Freq: Two times a day (BID) | OROMUCOSAL | Status: DC
  Administered 2017-06-27 – 2017-06-28 (×2): 15 mL via OROMUCOSAL

## 2017-06-27 MED ORDER — MORPHINE SULFATE (PF) 2 MG/ML IV SOLN: 1.0000 mg | INTRAVENOUS | Status: DC | PRN

## 2017-06-27 MED ORDER — ALBUMIN HUMAN 5 % IV SOLN
250.0000 mL | INTRAVENOUS | Status: AC | PRN
  Administered 2017-06-27 (×4): 250 mL via INTRAVENOUS
  Filled 2017-06-27 (×2): qty 250

## 2017-06-27 MED ORDER — ROCURONIUM BROMIDE 10 MG/ML (PF) SYRINGE
PREFILLED_SYRINGE | INTRAVENOUS | Status: AC
  Filled 2017-06-27: qty 10

## 2017-06-27 MED ORDER — FENTANYL CITRATE (PF) 250 MCG/5ML IJ SOLN
INTRAMUSCULAR | Status: AC
  Filled 2017-06-27: qty 5

## 2017-06-27 MED ORDER — METOPROLOL TARTRATE 12.5 MG HALF TABLET
12.5000 mg | ORAL_TABLET | Freq: Two times a day (BID) | ORAL | Status: DC
  Administered 2017-06-28 (×2): 12.5 mg via ORAL
  Filled 2017-06-27 (×2): qty 1

## 2017-06-27 MED ORDER — PROPOFOL 10 MG/ML IV BOLUS
INTRAVENOUS | Status: AC
  Filled 2017-06-27: qty 20

## 2017-06-27 SURGICAL SUPPLY — 100 items
ADAPTER CARDIO PERF ANTE/RETRO (ADAPTER) ×4 IMPLANT
ADPR PRFSN 84XANTGRD RTRGD (ADAPTER) ×2
BAG DECANTER FOR FLEXI CONT (MISCELLANEOUS) ×4 IMPLANT
BANDAGE ACE 4X5 VEL STRL LF (GAUZE/BANDAGES/DRESSINGS) ×4 IMPLANT
BANDAGE ACE 6X5 VEL STRL LF (GAUZE/BANDAGES/DRESSINGS) ×4 IMPLANT
BASKET HEART  (ORDER IN 25'S) (MISCELLANEOUS) ×1
BASKET HEART (ORDER IN 25'S) (MISCELLANEOUS) ×1
BASKET HEART (ORDER IN 25S) (MISCELLANEOUS) ×2 IMPLANT
BLADE CLIPPER SURG (BLADE) IMPLANT
BLADE STERNUM SYSTEM 6 (BLADE) ×4 IMPLANT
BLADE SURG 11 STRL SS (BLADE) ×2 IMPLANT
BLADE SURG 12 STRL SS (BLADE) ×4 IMPLANT
BNDG GAUZE ELAST 4 BULKY (GAUZE/BANDAGES/DRESSINGS) ×4 IMPLANT
CANISTER SUCT 3000ML PPV (MISCELLANEOUS) ×4 IMPLANT
CANNULA GUNDRY RCSP 15FR (MISCELLANEOUS) ×4 IMPLANT
CATH CPB KIT VANTRIGT (MISCELLANEOUS) ×4 IMPLANT
CATH ROBINSON RED A/P 18FR (CATHETERS) ×12 IMPLANT
CATH THORACIC 36FR RT ANG (CATHETERS) ×4 IMPLANT
CLIP RETRACTION 3.0MM CORONARY (MISCELLANEOUS) ×2 IMPLANT
CRADLE DONUT ADULT HEAD (MISCELLANEOUS) ×4 IMPLANT
DRAIN CHANNEL 32F RND 10.7 FF (WOUND CARE) ×4 IMPLANT
DRAPE CARDIOVASCULAR INCISE (DRAPES) ×4
DRAPE SLUSH/WARMER DISC (DRAPES) ×4 IMPLANT
DRAPE SRG 135X102X78XABS (DRAPES) ×2 IMPLANT
DRSG AQUACEL AG ADV 3.5X14 (GAUZE/BANDAGES/DRESSINGS) ×4 IMPLANT
ELECT BLADE 4.0 EZ CLEAN MEGAD (MISCELLANEOUS) ×4
ELECT BLADE 6.5 EXT (BLADE) ×4 IMPLANT
ELECT CAUTERY BLADE 6.4 (BLADE) ×4 IMPLANT
ELECT REM PT RETURN 9FT ADLT (ELECTROSURGICAL) ×8
ELECTRODE BLDE 4.0 EZ CLN MEGD (MISCELLANEOUS) ×2 IMPLANT
ELECTRODE REM PT RTRN 9FT ADLT (ELECTROSURGICAL) ×4 IMPLANT
FELT TEFLON 1X6 (MISCELLANEOUS) ×6 IMPLANT
FLOSEAL 10ML (HEMOSTASIS) ×2 IMPLANT
FLOSEAL 5ML (HEMOSTASIS) ×2 IMPLANT
GAUZE SPONGE 4X4 12PLY STRL (GAUZE/BANDAGES/DRESSINGS) ×8 IMPLANT
GLOVE BIO SURGEON STRL SZ7.5 (GLOVE) ×12 IMPLANT
GLOVE BIOGEL M 6.5 STRL (GLOVE) ×8 IMPLANT
GLOVE BIOGEL PI IND STRL 6.5 (GLOVE) IMPLANT
GLOVE BIOGEL PI INDICATOR 6.5 (GLOVE) ×10
GLOVE SS BIOGEL STRL SZ 6 (GLOVE) IMPLANT
GLOVE SUPERSENSE BIOGEL SZ 6 (GLOVE) ×4
GOWN STRL REUS W/ TWL LRG LVL3 (GOWN DISPOSABLE) ×8 IMPLANT
GOWN STRL REUS W/TWL LRG LVL3 (GOWN DISPOSABLE) ×32
HEMOSTAT POWDER SURGIFOAM 1G (HEMOSTASIS) ×6 IMPLANT
HEMOSTAT SURGICEL 2X14 (HEMOSTASIS) ×4 IMPLANT
INSERT FOGARTY XLG (MISCELLANEOUS) IMPLANT
KIT BASIN OR (CUSTOM PROCEDURE TRAY) ×4 IMPLANT
KIT SUCTION CATH 14FR (SUCTIONS) ×4 IMPLANT
KIT TURNOVER KIT B (KITS) ×4 IMPLANT
KIT VASOVIEW HEMOPRO VH 3000 (KITS) ×4 IMPLANT
LEAD PACING MYOCARDI (MISCELLANEOUS) ×4 IMPLANT
MARKER GRAFT CORONARY BYPASS (MISCELLANEOUS) ×12 IMPLANT
NS IRRIG 1000ML POUR BTL (IV SOLUTION) ×20 IMPLANT
PACK E OPEN HEART (SUTURE) ×4 IMPLANT
PACK OPEN HEART (CUSTOM PROCEDURE TRAY) ×4 IMPLANT
PAD ARMBOARD 7.5X6 YLW CONV (MISCELLANEOUS) ×8 IMPLANT
PAD ELECT DEFIB RADIOL ZOLL (MISCELLANEOUS) ×4 IMPLANT
PENCIL BUTTON HOLSTER BLD 10FT (ELECTRODE) ×6 IMPLANT
POWDER SURGICEL 3.0 GRAM (HEMOSTASIS) ×8 IMPLANT
PUNCH AORTIC ROTATE  4.5MM 8IN (MISCELLANEOUS) ×2 IMPLANT
PUNCH AORTIC ROTATE 4.0MM (MISCELLANEOUS) IMPLANT
PUNCH AORTIC ROTATE 4.5MM 8IN (MISCELLANEOUS) IMPLANT
PUNCH AORTIC ROTATE 5MM 8IN (MISCELLANEOUS) IMPLANT
SPONGE LAP 18X18 X RAY DECT (DISPOSABLE) ×6 IMPLANT
SPONGE LAP 4X18 RFD (DISPOSABLE) ×2 IMPLANT
SURGIFLO W/THROMBIN 8M KIT (HEMOSTASIS) ×2 IMPLANT
SUT BONE WAX W31G (SUTURE) ×4 IMPLANT
SUT MNCRL AB 4-0 PS2 18 (SUTURE) ×2 IMPLANT
SUT PROLENE 3 0 SH DA (SUTURE) IMPLANT
SUT PROLENE 3 0 SH1 36 (SUTURE) IMPLANT
SUT PROLENE 4 0 RB 1 (SUTURE) ×4
SUT PROLENE 4 0 SH DA (SUTURE) ×4 IMPLANT
SUT PROLENE 4-0 RB1 .5 CRCL 36 (SUTURE) ×2 IMPLANT
SUT PROLENE 5 0 C 1 36 (SUTURE) IMPLANT
SUT PROLENE 6 0 C 1 30 (SUTURE) ×6 IMPLANT
SUT PROLENE 6 0 CC (SUTURE) ×12 IMPLANT
SUT PROLENE 8 0 BV175 6 (SUTURE) ×4 IMPLANT
SUT PROLENE BLUE 7 0 (SUTURE) ×4 IMPLANT
SUT SILK  1 MH (SUTURE)
SUT SILK 1 MH (SUTURE) IMPLANT
SUT SILK 2 0 SH CR/8 (SUTURE) ×2 IMPLANT
SUT SILK 3 0 SH CR/8 (SUTURE) IMPLANT
SUT STEEL 6MS V (SUTURE) ×6 IMPLANT
SUT STEEL SZ 6 DBL 3X14 BALL (SUTURE) ×4 IMPLANT
SUT VIC AB 1 CTX 27 (SUTURE) ×4 IMPLANT
SUT VIC AB 1 CTX 36 (SUTURE) ×8
SUT VIC AB 1 CTX36XBRD ANBCTR (SUTURE) ×4 IMPLANT
SUT VIC AB 2-0 CT1 27 (SUTURE) ×4
SUT VIC AB 2-0 CT1 TAPERPNT 27 (SUTURE) IMPLANT
SUT VIC AB 2-0 CTX 27 (SUTURE) IMPLANT
SUT VIC AB 3-0 X1 27 (SUTURE) IMPLANT
SYSTEM SAHARA CHEST DRAIN ATS (WOUND CARE) ×4 IMPLANT
TAPE CLOTH SURG 4X10 WHT LF (GAUZE/BANDAGES/DRESSINGS) ×4 IMPLANT
TAPE PAPER 2X10 WHT MICROPORE (GAUZE/BANDAGES/DRESSINGS) ×2 IMPLANT
TOWEL GREEN STERILE (TOWEL DISPOSABLE) ×4 IMPLANT
TOWEL GREEN STERILE FF (TOWEL DISPOSABLE) ×4 IMPLANT
TRAY FOLEY SLVR 16FR TEMP STAT (SET/KITS/TRAYS/PACK) ×2 IMPLANT
TUBING INSUFFLATION (TUBING) ×4 IMPLANT
UNDERPAD 30X30 (UNDERPADS AND DIAPERS) ×4 IMPLANT
WATER STERILE IRR 1000ML POUR (IV SOLUTION) ×8 IMPLANT

## 2017-06-27 NOTE — Procedures (Signed)
Extubation Procedure Note  Patient Details:   Name: Chase Guerrero DOB: 07/07/1945 MRN: [id number]   Airway Documentation:    Vent end date: 06/27/17 Vent end time: 1837   Evaluation  O2 sats: stable throughout Complications: No apparent complications Patient did tolerate procedure well. Bilateral Breath Sounds: Clear, Diminished   Yes  4l/min [place] NIF -50 FVC-1.1L Incentive spirometer instructed  Revonda Standard 06/27/2017, 6:38 PM

## 2017-06-27 NOTE — Op Note (Signed)
NAMEWASYL, DORNFELD MEDICAL RECORD ZO:[id number] ACCOUNT [id number] DATE OF BIRTH:01/10/1946 FACILITY: [facility] LOCATION: [facility] PHYSICIAN:Nahuel Wilbert VAN TRIGT III, MD  OPERATIVE REPORT  DATE OF PROCEDURE:  06/27/2017  OPERATION:   1.  Coronary artery bypass grafting x2 (left internal mammary artery to left anterior descending, saphenous vein graft to diagonal)  2.  Endoscopic harvest of right leg greater saphenous vein.  SURGEON:  Renaye Rakers, M.D.  ASSISTANT:  Shaaron Adler, PA-C.  ANESTHESIA:  General.  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  DESCRIPTION OF PROCEDURE:  After the patient was reassessed in preop holding, after informed consent was documented, and after all the questions from the patient and family were addressed, the patient was brought to the operating room for the above-named  procedure.  The patient had previously been evaluated in consultation at the office where his coronary angiograms had been reviewed and the details of CABG discussed including the expected benefits, alternatives, surgical details and risks.  The patient  understood the risks of the surgery potentially included stroke, bleeding, blood transfusion, postoperative infection, postoperative pulmonary problems including pleural effusion, postoperative organ failure, and death.  After our extended conversation  in the office consult, he agreed to proceed with surgery under informed consent.  OPERATIVE FINDINGS: 1.  Large amount of fat in the chest, pleural membrane, pericardium, and mediastinum. 2.  Small but adequate target for grafting. 3.  Adequate conduit available including mammary artery and vein. 4.  Postop coagulopathy with low platelet count of 70,000.  DESCRIPTION OF PROCEDURE:  The patient was brought  to the operating room and placed supine on the operating table where general anesthesia was induced under invasive hemodynamic monitoring.  The chest, abdomen and legs were prepped and draped as a  sterile field.  A proper time-out was performed.  A sternal incision was made.  The saphenous vein was harvested endoscopically.  It was harvested from the right leg.  The left internal mammary artery was harvested as a pedicle graft from its origin at  the subclavian vessels.  It was 1.5 mm vessel with excellent flow.  The sternal retractor was placed using the deep blades.  Because of the patient's obese body habitus, the pericardium was opened and suspended.  Pursestrings were placed in the ascending  aorta and right atrium.  Heparin was administered.  After the vein was harvested, the patient was cannulated and placed on bypass.  The coronary arteries were identified for grafting.  The mammary artery and vein graft were prepared for the distal  anastomoses and cardioplegia cannulas were placed in both antegrade and retrograde cold blood cardioplegia.  The patient was cooled to 32 degrees and the aortic crossclamp was applied.  One liter of cold blood cardioplegia was delivered in split doses  basically between the antegrade aortic and retrograde coronary sinus catheters.  There was good cardioplegic arrest and separate temperature drop of less than 12 degrees.  Cardioplegia was delivered every 20 minutes or less.  The distal coronary anastomoses were performed.  The first distal anastomosis was the diagonal branch of LAD.  It was totally occluded proximally.  As 1.4 mm vessel, a reverse saphenous vein was sewn end-to-side with running 8-0 Prolene.  There was good  flow through the graft.  Cardioplegia was redosed.  The second distal anastomosis was the distal LAD.  It was  intramyocardial, deeply intramyocardial almost its entire length.  The area of the distal third of the septum became epicardial and it was  1.5 mm vessel.  The left IMA pedicle was brought through  an opening in the left lateral pericardium and the pericardium was brought down onto the LAD and sewn end-to-side with a running 8-0 Prolene.  There was good flow through the anastomosis after briefly releasing the bulldog on the mammary artery pedicle.   The bulldog was then reapplied to the pedicle, secured to the epicardium with Prolene sutures.  Cardioplegia was redosed.  While the cross clamp was still in place, a single proximal vein anastomosis was performed on the aorta using a 4.5 mm punch and running 6-0 Prolene.  Prior to tying down the final proximal anastomosis.  Air was vented from the corners with a dose of  retrograde warm blood cardioplegia.  The crossclamp was removed.  Heart was cardioverted back to a regular rhythm.  The vein graft was de-aired and opened and it had good flow.  Hemostasis was documented at the proximal and distal sites.  The patient was rewarmed and reperfused.  Temporary pacing wires were applied.   The lungs were expanded and the ventilator was resumed.  The patient was weaned from cardiopulmonary bypass without difficulty.  Echo showed improved global LV function, normal.  Protamine was administered without adverse reaction.  The cannula was  removed.  The mediastinum was irrigated.  The superior pericardial fat was closed over the aorta and vein grafts.  The anterior mediastinal and left pleural chest tubes were placed and brought out through separate incisions.  The sternum was closed with  a wire.  The pectoralis fascia was closed with a running #1 Vicryl.  Subcutaneous and skin layers were closed with a running Vicryl.  Total cardiopulmonary bypass time was 75 minutes.  AN/NUANCE  D:06/27/2017 T:06/27/2017 JOB:[id number]

## 2017-06-27 NOTE — Anesthesia Procedure Notes (Signed)
Central Venous Catheter Insertion Performed by: Roderic Palau, MD, anesthesiologist Start/End5/20/2019 6:50 AM, 06/27/2017 7:05 AM Patient location: Pre-op. Preanesthetic checklist: patient identified, IV checked, site marked, risks and benefits discussed, surgical consent, monitors and equipment checked, pre-op evaluation, timeout performed and anesthesia consent Position: Trendelenburg Lidocaine 1% used for infiltration and patient sedated Hand hygiene performed , maximum sterile barriers used  and Seldinger technique used Catheter size: 8.5 Fr Total catheter length 10. Central line was placed.Sheath introducer Swan type:thermodilution Procedure performed using ultrasound guided technique. Ultrasound Notes:anatomy identified, needle tip was noted to be adjacent to the nerve/plexus identified, no ultrasound evidence of intravascular and/or intraneural injection and image(s) printed for medical record Attempts: 1 Following insertion, line sutured, dressing applied and Biopatch. Post procedure assessment: blood return through all ports, free fluid flow and no air  Patient tolerated the procedure well with no immediate complications.

## 2017-06-27 NOTE — Anesthesia Procedure Notes (Signed)
Procedure Name: Intubation Date/Time: 06/27/2017 7:34 AM Performed by: Neldon Newport, CRNA Pre-anesthesia Checklist: Timeout performed, Patient being monitored, Emergency Drugs available, Patient identified and Suction available Patient Re-evaluated:Patient Re-evaluated prior to induction Oxygen Delivery Method: Circle system utilized Preoxygenation: Pre-oxygenation with 100% oxygen Induction Type: IV induction Ventilation: Oral airway inserted - appropriate to patient size and Mask ventilation without difficulty Laryngoscope Size: Mac and 4 Grade View: Grade II Tube type: Oral Tube size: 8.0 mm Number of attempts: 1 Placement Confirmation: breath sounds checked- equal and bilateral,  positive ETCO2 and ETT inserted through vocal cords under direct vision Secured at: 23 cm Tube secured with: Tape Dental Injury: Teeth and Oropharynx as per pre-operative assessment

## 2017-06-27 NOTE — Anesthesia Procedure Notes (Signed)
Arterial Line Insertion Start/End5/20/2019 6:45 AM, 06/27/2017 6:55 AM Performed by: Neldon Newport, CRNA, CRNA  Patient location: Pre-op. Preanesthetic checklist: patient identified, IV checked, surgical consent and monitors and equipment checked Lidocaine 1% used for infiltration and patient sedated Left, radial was placed Catheter size: 20 G Hand hygiene performed  and maximum sterile barriers used   Attempts: 1 Procedure performed without using ultrasound guided technique. Following insertion, dressing applied and Biopatch. Post procedure assessment: normal and unchanged  Patient tolerated the procedure well with no immediate complications.

## 2017-06-27 NOTE — H&P (Signed)
PCP is [name], MD Referring Provider is No ref. provider found  No chief complaint on file.  Patient examined, images of coronary angiograms personally reviewed and counseled with patient HPI: 72 year old nondiabetic reformed smoker presents for evaluation and therapy  for recently diagnosed severe coronary artery disease.  Last month the patient had a extended period of chest pain/pressure which lasted approximately 2 hours and radiated into his right neck and shoulder area.  It did not recur.  Patient mentioned this to Dr. Willey Blade at his annual exam shortly thereafter.  Dr. Willey Blade performed a 12-lead EKG and compared to his baseline and saw some changes.  For that reason the stress test was performed which was positive for ischemic changes.  Dr. Ellyn Hack did a catheterization via right radial artery 1 week ago which showed occlusion of the LAD at its ostium.  The LAD fills via collaterals from the right side as well as the first diagonal which appeared to have a proximal stenosis as well.  LVEDP was normal. Anterior wall was moderately hypokinetic.  Based on the patient's coronary anatomy and recent symptoms surgical coronary revascularization was recommended by his cardiologist. Past Medical History:  Diagnosis Date  . Cancer ([place])    skin  . Coronary artery disease   . Enlarged prostate   . GERD (gastroesophageal reflux disease)   . Hypertension   . Myocardial infarction ([facility])    mild  . Sleep apnea    not wearing  cpap  not worn in 9-10 yrs    Past Surgical History:  Procedure Laterality Date  . COLONOSCOPY N/A 07/23/2015   Procedure: COLONOSCOPY;  Surgeon: Daneil Dolin, MD;  Location: [facility];  Service: Endoscopy;  Laterality: N/A;  10:30 Am  . LEFT HEART CATH AND CORONARY ANGIOGRAPHY N/A 06/14/2017   Procedure: LEFT HEART CATH AND CORONARY ANGIOGRAPHY;  Surgeon: Leonie Man, MD;  Location: [facility];  Service: Cardiovascular;  Laterality: N/A;  . Right hand  surgery    . Right knee arthroscopy      Family History  Problem Relation Age of Onset  . Alzheimer's disease Mother   . Arthritis Sister   . Heart attack Brother   . Hypercholesterolemia Sister   . Liver disease Sister     Social History Social History   Tobacco Use  . Smoking status: Former Smoker    Packs/day: 0.50    Years: 20.00    Pack years: 10.00    Types: Cigarettes  . Smokeless tobacco: Current User    Types: Snuff  . Tobacco comment: USING NICORETTE GUM TO TRY AND STOP USING SMOKELESS TOBACCO  Substance Use Topics  . Alcohol use: No  . Drug use: No    Current Facility-Administered Medications  Medication Dose Route Frequency Provider Last Rate Last Dose  . 0.45 % sodium chloride infusion   Intravenous Continuous PRN Elgie Collard, PA-C 20 mL/hr at 06/27/17 1235    . [START ON 06/28/2017] 0.9 %  sodium chloride infusion  250 mL Intravenous Continuous Conte, Tessa N, PA-C      . 0.9 %  sodium chloride infusion   Intravenous Continuous Elgie Collard, PA-C 20 mL/hr at 06/27/17 1235    . [START ON 06/28/2017] acetaminophen (TYLENOL) tablet 1,000 mg  1,000 mg Oral Q6H Conte, Tessa N, PA-C       Or  . [START ON 06/28/2017] acetaminophen (TYLENOL) solution 1,000 mg  1,000 mg Per Tube Q6H Conte, Tessa N, PA-C      .  albumin human 5 % solution 250 mL  250 mL Intravenous Q15 min PRN Harriet Pho, Tessa N, PA-C   250 mL at 06/27/17 1420  . [START ON 06/28/2017] aspirin EC tablet 325 mg  325 mg Oral Daily Conte, Tessa N, PA-C       Or  . [START ON 06/28/2017] aspirin chewable tablet 324 mg  324 mg Per Tube Daily Conte, Tessa N, [place]      . [START ON 06/28/2017] atorvastatin (LIPITOR) tablet 80 mg  80 mg Oral q1800 Elgie Collard, [place]      . [START ON 06/28/2017] bisacodyl (DULCOLAX) EC tablet 10 mg  10 mg Oral Daily Conte, Tessa N, PA-C       Or  . Derrill Memo ON 06/28/2017] bisacodyl (DULCOLAX) suppository 10 mg  10 mg Rectal Daily Conte, Tessa N, PA-C      . cefUROXime (ZINACEF) 1.5 g in  sodium chloride 0.9 % 100 mL IVPB  1.5 g Intravenous Q12H Conte, Tessa N, PA-C      . chlorhexidine (PERIDEX) 0.12 % solution 15 mL  15 mL Mouth/Throat NOW Conte, Tessa N, PA-C      . chlorhexidine gluconate (MEDLINE KIT) (PERIDEX) 0.12 % solution 15 mL  15 mL Mouth Rinse BID Prescott Gum, Collier Salina, MD      . Chlorhexidine Gluconate Cloth 2 % PADS 6 each  6 each Topical Daily Prescott Gum, Collier Salina, MD      . dexmedetomidine (PRECEDEX) 200 MCG/50ML (4 mcg/mL) infusion  0-0.7 mcg/kg/hr Intravenous Continuous Nicholes Rough N, PA-C 8.8 mL/hr at 06/27/17 1445 0.4 mcg/kg/hr at 06/27/17 1445  . [START ON 06/28/2017] docusate sodium (COLACE) capsule 200 mg  200 mg Oral Daily Conte, Tessa N, [place]      . DOPamine (INTROPIN) 800 mg in dextrose 5 % 250 mL (3.2 mg/mL) infusion  3-10 mcg/kg/min Intravenous Titrated Harriet Pho, Tessa N, PA-C 4.9 mL/hr at 06/27/17 1300 3 mcg/kg/min at 06/27/17 1300  . famotidine (PEPCID) IVPB 20 mg premix  20 mg Intravenous Q12H Elgie Collard, PA-C   Stopped at 06/27/17 1330  . [START ON 06/28/2017] finasteride (PROSCAR) tablet 5 mg  5 mg Oral Daily Conte, Tessa N, PA-C      . insulin regular (NOVOLIN R,HUMULIN R) 100 Units in sodium chloride 0.9 % 100 mL (1 Units/mL) infusion   Intravenous Continuous Elgie Collard, PA-C 2 mL/hr at 06/27/17 1404 2 Units/hr at 06/27/17 1404  . insulin regular bolus via infusion 0-10 Units  0-10 Units Intravenous TID WC Conte, Tessa N, PA-C      . lactated ringers infusion 500 mL  500 mL Intravenous Once PRN Conte, Tessa N, PA-C      . lactated ringers infusion   Intravenous Continuous Conte, Tessa N, PA-C      . lactated ringers infusion   Intravenous Continuous Elgie Collard, PA-C 20 mL/hr at 06/27/17 1354    . magnesium sulfate IVPB 4 g 100 mL  4 g Intravenous Once Elgie Collard, PA-C 20 mL/hr at 06/27/17 1300 4 g at 06/27/17 1300  . MEDLINE mouth rinse  15 mL Mouth Rinse QID Prescott Gum, Collier Salina, MD      . metoCLOPramide [facility]) injection 10 mg  10 mg Intravenous  Q6H Conte, Tessa N, PA-C      . metoprolol tartrate (LOPRESSOR) tablet 12.5 mg  12.5 mg Oral BID Conte, Tessa N, PA-C       Or  . metoprolol tartrate (LOPRESSOR) 25 mg/10 mL oral suspension 12.5  mg  12.5 mg Per Tube BID Harriet Pho, Tessa N, PA-C      . metoprolol tartrate (LOPRESSOR) injection 2.5-5 mg  2.5-5 mg Intravenous Q2H PRN Conte, Tessa N, PA-C      . midazolam (VERSED) injection 2 mg  2 mg Intravenous Q1H PRN Conte, Tessa N, PA-C      . milrinone (PRIMACOR) 20 MG/100 ML (0.2 mg/mL) infusion  0.125 mcg/kg/min Intravenous To OR Prescott Gum, Collier Salina, MD      . milrinone (PRIMACOR) 20 MG/100 ML (0.2 mg/mL) infusion  0.3 mcg/kg/min Intravenous Continuous Harriet Pho, Tessa N, PA-C 6.6 mL/hr at 06/27/17 1029 0.25 mcg/kg/min at 06/27/17 1029  . morphine 2 MG/ML injection 1-4 mg  1-4 mg Intravenous Q1H PRN Elgie Collard, PA-C      . [START ON 06/28/2017] morphine 2 MG/ML injection 2-5 mg  2-5 mg Intravenous Q1H PRN Harriet Pho, Tessa N, PA-C      . nitroGLYCERIN 50 mg in dextrose 5 % 250 mL (0.2 mg/mL) infusion  0-100 mcg/min Intravenous Titrated Elgie Collard, PA-C   Stopped at 06/27/17 1235  . ondansetron (ZOFRAN) injection 4 mg  4 mg Intravenous Q6H PRN Harriet Pho, Tessa N, PA-C      . oxyCODONE (Oxy IR/ROXICODONE) immediate release tablet 5-10 mg  5-10 mg Oral Q3H PRN Harriet Pho, Tessa N, PA-C      . [START ON 06/29/2017] pantoprazole (PROTONIX) EC tablet 40 mg  40 mg Oral Daily Conte, Tessa N, PA-C      . phenylephrine (NEO-SYNEPHRINE) 20 mg in sodium chloride 0.9 % 250 mL (0.08 mg/mL) infusion  0-100 mcg/min Intravenous Titrated Conte, Tessa N, PA-C 67.5 mL/hr at 06/27/17 1300 90 mcg/min at 06/27/17 1300  . potassium chloride 10 mEq in 50 mL *CENTRAL LINE* IVPB  10 mEq Intravenous Q1 Hr x 3 Conte, Tessa N, PA-C      . sodium chloride flush (NS) 0.9 % injection 10-40 mL  10-40 mL Intracatheter Q12H Prescott Gum, Collier Salina, MD      . sodium chloride flush (NS) 0.9 % injection 10-40 mL  10-40 mL Intracatheter PRN Prescott Gum, Collier Salina,  MD      . Derrill Memo ON 06/28/2017] sodium chloride flush (NS) 0.9 % injection 3 mL  3 mL Intravenous Q12H Conte, Tessa N, [place]      . [START ON 06/28/2017] sodium chloride flush (NS) 0.9 % injection 3 mL  3 mL Intravenous PRN Conte, Tessa N, PA-C      . traMADol Veatrice Bourbon) tablet 50-100 mg  50-100 mg Oral Q4H PRN Conte, Tessa N, PA-C      . vancomycin (VANCOCIN) IVPB 1000 mg/200 mL premix  1,000 mg Intravenous Once Conte, Tessa N, PA-C        No Known Allergies  Review of Systems        Review of Systems :  [ y ] = yes, [  ] = no        General :  Weight gain [   ]    Weight loss  [   ]  Fatigue [  ]  Fever [  ]  Chills  [  ]                                Weakness  [  ]           HEENT    Headache [  ]  Dizziness [  ]  Blurred  vision [  ] Glaucoma  [  ]                          Nosebleeds [  ] Painful or loose teeth [ y- recent dental extraction following failed root canal]-        Cardiac :  Chest pain/ pressure Blue.Reese  ]  Resting SOB [  ] exertional SOB [  ]                        Orthopnea [  ]  Pedal edema  [  ]  Palpitations [  ] Syncope/presyncope [ ]                         Paroxysmal nocturnal dyspnea [  ]         Pulmonary : cough [  ]  wheezing [  ]  Hemoptysis [  ] Sputum [  ] Snoring [  ]                              Pneumothorax [  ]  Sleep apnea [ y ]        GI : Vomiting [  ]  Dysphagia [  ]  Melena  [  ]  Abdominal pain [  ] BRBPR [  ]              Heart burn [  ]  Constipation [  ] Diarrhea  [  ] Colonoscopy [   ]        GU : Hematuria [  ]  Dysuria [  ]  Nocturia [  ] UTI's [  ]        Vascular : Claudication [  ]  Rest pain [  ]  DVT [  ] Vein stripping [  ] leg ulcers [  ]                          TIA [  ] Stroke [  ]  Varicose veins [  ]        NEURO :  Headaches  [  ] Seizures [  ] Vision changes [  ] Paresthesias [  ]     right-hand-dominant                                  seizures [  ]        Musculoskeletal :  Arthritis [  ] Gout  [  ]  Back pain [  ]  Joint pain [   ]        Skin :  Rash [  ]  Melanoma [  ] Sores [  ]        Heme : Bleeding problems [  ]Clotting Disorders [  ] Anemia [  ]Blood Transfusion [ ]         Endocrine : Diabetes [  ] Heat or Cold intolerance [  ] Polyuria [  ]excessive thirst [ ]         Psych : Depression [  ]  Anxiety [  ]  Psych hospitalizations [  ] Memory change [  ]  BP 114/62   Pulse 89   Temp (!) 97.2 F (36.2 C) (Core)   Resp 12   SpO2 100%  Physical Exam     Physical Exam  General: Well-nourished 72 year old Caucasian male no acute distress accompanied by his wife HEENT: Normocephalic pupils equal , dentition adequate Neck: Supple without JVD, adenopathy, or bruit Chest: Clear to auscultation, symmetrical breath sounds, no rhonchi, no tenderness             or deformity Cardiovascular: Regular rate and rhythm, no murmur, no gallop, peripheral pulses             palpable in all extremities Abdomen:  Soft, nontender, obese, no palpable mass or organomegaly Extremities: Warm, well-perfused, no clubbing cyanosis edema or tenderness,              no venous stasis changes of the legs Rectal/GU: Deferred Neuro: Grossly non--focal and symmetrical throughout Skin: Clean and dry without rash or ulceration   Diagnostic Tests: Coronary angiogram show occlusion of the proximal LAD which fills distally via collaterals appears to be an adequate target for bypass grafting.  First diagonal also fills via collaterals and there appears to be a stenosis at its origin from the LAD.  Anterior wall is moderately hypokinetic overall EF 45%.  Impression: Patient would benefit from bypass graft to the LAD and diagonal. Revascularization would improve his survival and improve his symptoms of coronary disease. Plan: CABG x2 schedule  [facility] on May 20.  I discussed the procedure of CABG in detail with the patient and his wife including the indications benefits alternatives  and risks.  They understand the potential risks to include stroke, bleeding, blood transfusion, MI, pulmonary problems including pleural effusion, infection, organ failure, and death.  He demonstrates his understanding and agrees to proceed with surgery under what I feel is an informed consent   CABG x2 at [facility] 06-27-2017  Len Childs, MD [facility] [phone]

## 2017-06-27 NOTE — Progress Notes (Signed)
      [address]       [place] [address]             [phone]      S/p CABG x 2  Waking up   BP (!) 109/58   Pulse 90   Temp 99.1 F (37.3 C)   Resp (!) 9   SpO2 99%  CI= 3.2  Intake/Output Summary (Last 24 hours) at 06/27/2017 1803 Last data filed at 06/27/2017 1600 Gross per 24 hour  Intake 6206.91 ml  Output 2890 ml  Net 3316.91 ml   Minimal CT output  Doing well early postop  Remo Lipps C. Roxan Hockey, MD [facility] [phone]

## 2017-06-27 NOTE — OR Nursing (Signed)
11:47 - 20 minute call to SICU charge nurse

## 2017-06-27 NOTE — Brief Op Note (Signed)
06/27/2017  9:44 AM  PATIENT:  Chase Guerrero  72 y.o. male  PRE-OPERATIVE DIAGNOSIS:  CAD  POST-OPERATIVE DIAGNOSIS:  CAD  PROCEDURE:  Procedure(s): CORONARY ARTERY BYPASS GRAFTING (CABG) x 2  WITH ENDOSCOPIC HARVESTING OF RIGHT SAPHENOUS VEIN (N/A) TRANSESOPHAGEAL ECHOCARDIOGRAM (TEE) (N/A)  LIMA to LAD SVG to Diag1  SURGEON:  Surgeon(s) and Role:    Ivin Poot, MD - Primary  PHYSICIAN ASSISTANT:  Nicholes Rough, PA-C   ANESTHESIA:   general  EBL:  400 mL   BLOOD ADMINISTERED:none  DRAINS: ROUTINE   LOCAL MEDICATIONS USED:  NONE  SPECIMEN:  No Specimen  DISPOSITION OF SPECIMEN:  N/A  COUNTS:  YES  DICTATION: .Dragon Dictation  PLAN OF CARE: Admit to inpatient   PATIENT DISPOSITION:  ICU - intubated and hemodynamically stable.   Delay start of Pharmacological VTE agent (>24hrs) due to surgical blood loss or risk of bleeding: yes

## 2017-06-27 NOTE — Progress Notes (Signed)
  Echocardiogram 2D Echocardiogram has been performed.  Jennette Dubin 06/27/2017, 8:37 AM

## 2017-06-27 NOTE — OR Nursing (Signed)
11:15 - 45 minute call to SICU charge nurse

## 2017-06-27 NOTE — Anesthesia Postprocedure Evaluation (Signed)
Anesthesia Post Note  Patient: Chase Guerrero  Procedure(s) Performed: CORONARY ARTERY BYPASS GRAFTING (CABG) x 2  WITH ENDOSCOPIC HARVESTING OF RIGHT SAPHENOUS VEIN (N/A Chest) TRANSESOPHAGEAL ECHOCARDIOGRAM (TEE) (N/A )     Anesthesia Post Evaluation  Last Vitals:  Vitals:   06/27/17 1306 06/27/17 1334  BP:    Pulse: 89 89  Resp: 12 12  Temp: (!) 35.6 C (!) 35.6 C  SpO2: 99% 99%    Last Pain:  Vitals:   06/27/17 1334  TempSrc: Core  PainSc:                  Alayiah Fontes,Mannie TERRILL

## 2017-06-27 NOTE — Anesthesia Procedure Notes (Signed)
Performed by: Neldon Newport, CRNA

## 2017-06-27 NOTE — Progress Notes (Signed)
Pre Procedure note for inpatients:   Chase Guerrero has been scheduled for Procedure(s): CORONARY ARTERY BYPASS GRAFTING (CABG) (N/A) TRANSESOPHAGEAL ECHOCARDIOGRAM (TEE) (N/A) today. The various methods of treatment have been discussed with the patient. After consideration of the risks, benefits and treatment options the patient has consented to the planned procedure.   The patient has been seen and labs reviewed. There are no changes in the patient's condition to prevent proceeding with the planned procedure today.  Recent labs:  Lab Results  Component Value Date   WBC 10.7 (H) 06/24/2017   HGB 15.2 06/24/2017   HCT 45.7 06/24/2017   PLT 205 06/24/2017   GLUCOSE 128 (H) 06/24/2017   ALT 15 (L) 06/24/2017   AST 18 06/24/2017   NA 138 06/24/2017   K 4.2 06/24/2017   CL 106 06/24/2017   CREATININE 1.33 (H) 06/24/2017   BUN 15 06/24/2017   CO2 21 (L) 06/24/2017   INR 1.05 06/24/2017   HGBA1C 5.9 (H) 06/24/2017    Len Childs, MD 06/27/2017 7:17 AM

## 2017-06-27 NOTE — Transfer of Care (Signed)
Immediate Anesthesia Transfer of Care Note  Patient: Forest Redwine  Procedure(s) Performed: CORONARY ARTERY BYPASS GRAFTING (CABG) x 2  WITH ENDOSCOPIC HARVESTING OF RIGHT SAPHENOUS VEIN (N/A Chest) TRANSESOPHAGEAL ECHOCARDIOGRAM (TEE) (N/A )  Patient Location: SICU  Anesthesia Type:General  Level of Consciousness: Patient remains intubated per anesthesia plan  Airway & Oxygen Therapy: Patient remains intubated per anesthesia plan and Patient placed on Ventilator (see vital sign flow sheet for setting)  Post-op Assessment: Report given to RN and Post -op Vital signs reviewed and stable  Post vital signs: Reviewed and stable  Last Vitals:  Vitals Value Taken Time  BP    Temp 35.6 C 06/27/2017 12:47 PM  Pulse 79 06/27/2017 12:47 PM  Resp 17 06/27/2017 12:47 PM  SpO2 100 % 06/27/2017 12:47 PM  Vitals shown include unvalidated device data.  Last Pain:  Vitals:   06/27/17 0603  TempSrc:   PainSc: 0-No pain      Patients Stated Pain Goal: 3 (03/70/96 4383)  Complications: No apparent anesthesia complications

## 2017-06-27 NOTE — OR Nursing (Signed)
1214 rolling call made to SICU RN.

## 2017-06-27 NOTE — Anesthesia Postprocedure Evaluation (Signed)
Anesthesia Post Note  Patient: Evertt Guerrero  Procedure(s) Performed: CORONARY ARTERY BYPASS GRAFTING (CABG) x 2  WITH ENDOSCOPIC HARVESTING OF RIGHT SAPHENOUS VEIN (N/A Chest) TRANSESOPHAGEAL ECHOCARDIOGRAM (TEE) (N/A )     Patient location during evaluation: SICU Anesthesia Type: General Level of consciousness: sedated and patient remains intubated per anesthesia plan Pain management: pain level controlled Vital Signs Assessment: post-procedure vital signs reviewed and stable Respiratory status: patient remains intubated per anesthesia plan Cardiovascular status: stable Postop Assessment: no apparent nausea or vomiting Anesthetic complications: no    Last Vitals:  Vitals:   06/27/17 1306 06/27/17 1334  BP:    Pulse: 89 89  Resp: 12 12  Temp: (!) 35.6 C (!) 35.6 C  SpO2: 99% 99%    Last Pain:  Vitals:   06/27/17 1334  TempSrc: Core  PainSc:                  Rhodes Calvert,Kapil TERRILL

## 2017-06-27 NOTE — Anesthesia Procedure Notes (Signed)
Central Venous Catheter Insertion Performed by: Roderic Palau, MD, anesthesiologist Start/End5/20/2019 6:50 AM, 06/27/2017 7:05 AM Patient location: Pre-op. Preanesthetic checklist: patient identified, IV checked, site marked, risks and benefits discussed, surgical consent, monitors and equipment checked, pre-op evaluation, timeout performed and anesthesia consent Hand hygiene performed  and maximum sterile barriers used  PA cath was placed.Swan type:thermodilution PA Cath depth:50 Procedure performed without using ultrasound guided technique. Attempts: 1 Patient tolerated the procedure well with no immediate complications.

## 2017-06-28 ENCOUNTER — Inpatient Hospital Stay (HOSPITAL_COMMUNITY): Payer: PPO

## 2017-06-28 ENCOUNTER — Encounter (HOSPITAL_COMMUNITY): Payer: Self-pay | Admitting: Cardiothoracic Surgery

## 2017-06-28 LAB — BPAM FFP
Blood Product Expiration Date: 201905222359
Blood Product Expiration Date: 201905222359
ISSUE DATE / TIME: 201905201258
ISSUE DATE / TIME: 201905201403
Unit Type and Rh: 6200
Unit Type and Rh: 6200

## 2017-06-28 LAB — BPAM PLATELET PHERESIS
Blood Product Expiration Date: 201905232359
ISSUE DATE / TIME: 201905201426
Unit Type and Rh: 6200

## 2017-06-28 LAB — CREATININE, SERUM
CREATININE: 1.3 mg/dL — AB (ref 0.61–1.24)
GFR calc non Af Amer: 53 mL/min — ABNORMAL LOW (ref >60)

## 2017-06-28 LAB — CBC
HCT: 25.1 % — ABNORMAL LOW (ref 39.0–52.0)
HEMATOCRIT: 21.4 % — AB (ref 39.0–52.0)
HEMOGLOBIN: 8.5 g/dL — AB (ref 13.0–17.0)
Hemoglobin: 7.3 g/dL — ABNORMAL LOW (ref 13.0–17.0)
MCH: 28.9 pg (ref 26.0–34.0)
MCH: 28.6 pg (ref 26.0–34.0)
MCHC: 34.1 g/dL (ref 30.0–36.0)
MCHC: 33.9 g/dL (ref 30.0–36.0)
MCV: 84.6 fL (ref 78.0–100.0)
MCV: 84.5 fL (ref 78.0–100.0)
Platelets: 121 10*3/uL — ABNORMAL LOW (ref 150–400)
Platelets: 125 10*3/uL — ABNORMAL LOW (ref 150–400)
RBC: 2.53 MIL/uL — ABNORMAL LOW (ref 4.22–5.81)
RBC: 2.97 MIL/uL — AB (ref 4.22–5.81)
RDW: 13.4 % (ref 11.5–15.5)
RDW: 13.5 % (ref 11.5–15.5)
WBC: 14.6 10*3/uL — ABNORMAL HIGH (ref 4.0–10.5)
WBC: 17.2 10*3/uL — AB (ref 4.0–10.5)

## 2017-06-28 LAB — GLUCOSE, CAPILLARY
Glucose-Capillary: 112 mg/dL — ABNORMAL HIGH (ref 65–99)
Glucose-Capillary: 113 mg/dL — ABNORMAL HIGH (ref 65–99)
Glucose-Capillary: 103 mg/dL — ABNORMAL HIGH (ref 65–99)
Glucose-Capillary: 104 mg/dL — ABNORMAL HIGH (ref 65–99)
Glucose-Capillary: 117 mg/dL — ABNORMAL HIGH (ref 65–99)
Glucose-Capillary: 89 mg/dL (ref 65–99)
Glucose-Capillary: 117 mg/dL — ABNORMAL HIGH (ref 65–99)
Glucose-Capillary: 126 mg/dL — ABNORMAL HIGH (ref 65–99)
Glucose-Capillary: 106 mg/dL — ABNORMAL HIGH (ref 65–99)
Glucose-Capillary: 109 mg/dL — ABNORMAL HIGH (ref 65–99)
Glucose-Capillary: 110 mg/dL — ABNORMAL HIGH (ref 65–99)
Glucose-Capillary: 146 mg/dL — ABNORMAL HIGH (ref 65–99)
Glucose-Capillary: 102 mg/dL — ABNORMAL HIGH (ref 65–99)
Glucose-Capillary: 136 mg/dL — ABNORMAL HIGH (ref 65–99)
Glucose-Capillary: 145 mg/dL — ABNORMAL HIGH (ref 65–99)

## 2017-06-28 LAB — BASIC METABOLIC PANEL
ANION GAP: 5 (ref 5–15)
BUN: 12 mg/dL (ref 6–20)
CHLORIDE: 107 mmol/L (ref 101–111)
CO2: 25 mmol/L (ref 22–32)
CREATININE: 1.15 mg/dL (ref 0.61–1.24)
Calcium: 8.4 mg/dL — ABNORMAL LOW (ref 8.9–10.3)
GFR calc non Af Amer: >60 mL/min (ref >60)
Glucose, Bld: 118 mg/dL — ABNORMAL HIGH (ref 65–99)
Potassium: 4.5 mmol/L (ref 3.5–5.1)
SODIUM: 137 mmol/L (ref 135–145)

## 2017-06-28 LAB — POCT I-STAT, CHEM 8
BUN: 15 mg/dL (ref 6–20)
Calcium, Ion: 1.17 mmol/L (ref 1.15–1.40)
Chloride: 96 mmol/L — ABNORMAL LOW (ref 101–111)
Creatinine, Ser: 1.1 mg/dL (ref 0.61–1.24)
Glucose, Bld: 150 mg/dL — ABNORMAL HIGH (ref 65–99)
HCT: 24 % — ABNORMAL LOW (ref 39.0–52.0)
Hemoglobin: 8.2 g/dL — ABNORMAL LOW (ref 13.0–17.0)
Potassium: 4.1 mmol/L (ref 3.5–5.1)
Sodium: 133 mmol/L — ABNORMAL LOW (ref 135–145)
TCO2: 24 mmol/L (ref 22–32)

## 2017-06-28 LAB — PREPARE RBC (CROSSMATCH)

## 2017-06-28 LAB — PREPARE FRESH FROZEN PLASMA

## 2017-06-28 LAB — PREPARE PLATELET PHERESIS: Unit division: 0

## 2017-06-28 LAB — MAGNESIUM
MAGNESIUM: 2.1 mg/dL (ref 1.7–2.4)
Magnesium: 2.1 mg/dL (ref 1.7–2.4)

## 2017-06-28 MED ORDER — INSULIN DETEMIR 100 UNIT/ML ~~LOC~~ SOLN
15.0000 [IU] | Freq: Every day | SUBCUTANEOUS | Status: DC
  Filled 2017-06-28: qty 0.15

## 2017-06-28 MED ORDER — INSULIN ASPART 100 UNIT/ML ~~LOC~~ SOLN: 0.0000 [IU] | Freq: Three times a day (TID) | SUBCUTANEOUS | Status: DC

## 2017-06-28 MED ORDER — INSULIN ASPART 100 UNIT/ML ~~LOC~~ SOLN
0.0000 [IU] | SUBCUTANEOUS | Status: DC
  Administered 2017-06-28 – 2017-06-30 (×8): 2 [IU] via SUBCUTANEOUS

## 2017-06-28 MED ORDER — ASPIRIN EC 81 MG PO TBEC
81.0000 mg | DELAYED_RELEASE_TABLET | Freq: Every day | ORAL | Status: DC
  Administered 2017-06-28 – 2017-07-04 (×7): 81 mg via ORAL
  Filled 2017-06-28 (×7): qty 1

## 2017-06-28 MED ORDER — VANCOMYCIN HCL IN DEXTROSE 1-5 GM/200ML-% IV SOLN
1000.0000 mg | Freq: Once | INTRAVENOUS | Status: DC
  Filled 2017-06-28 (×3): qty 200

## 2017-06-28 MED ORDER — FUROSEMIDE 10 MG/ML IJ SOLN
20.0000 mg | Freq: Every day | INTRAMUSCULAR | Status: DC
  Administered 2017-06-28: 20 mg via INTRAVENOUS
  Filled 2017-06-28: qty 2

## 2017-06-28 MED ORDER — VANCOMYCIN HCL IN DEXTROSE 1-5 GM/200ML-% IV SOLN
1000.0000 mg | Freq: Once | INTRAVENOUS | Status: AC
  Administered 2017-06-28: 1000 mg via INTRAVENOUS
  Filled 2017-06-28: qty 200

## 2017-06-28 MED ORDER — INSULIN DETEMIR 100 UNIT/ML ~~LOC~~ SOLN
15.0000 [IU] | Freq: Every day | SUBCUTANEOUS | Status: DC
  Administered 2017-06-28 – 2017-06-30 (×2): 15 [IU] via SUBCUTANEOUS
  Filled 2017-06-28 (×4): qty 0.15

## 2017-06-28 MED ORDER — INSULIN DETEMIR 100 UNIT/ML ~~LOC~~ SOLN: 10.0000 [IU] | Freq: Every day | SUBCUTANEOUS | Status: DC

## 2017-06-28 NOTE — Progress Notes (Signed)
[facility] BRIEF SICU PROGRESS NOTE  1 Day Post-Op  S/P Procedure(s) (LRB): CORONARY ARTERY BYPASS GRAFTING (CABG) x 2  WITH ENDOSCOPIC HARVESTING OF RIGHT SAPHENOUS VEIN (N/A) TRANSESOPHAGEAL ECHOCARDIOGRAM (TEE) (N/A)   Stable day NSR w/ stable BP Breathing comfortably w/ O2 sats 94% on room air UOP adequate Labs okay.  Hgb up 8.5  Plan: Continue current plan  Rexene Alberts, MD 06/28/2017 6:48 PM

## 2017-06-28 NOTE — Plan of Care (Signed)
Patient alert and oriented x4, continues to remain stable post extubation, dangled on side of bed as charted, tolerated well.  Only complaint was dizziness with position change.  Monitoring continues.

## 2017-06-28 NOTE — Progress Notes (Addendum)
[facility] DAILY ICU PROGRESS NOTE                   [address]            [place] [address]          [phone]   1 Day Post-Op Procedure(s) (LRB): CORONARY ARTERY BYPASS GRAFTING (CABG) x 2  WITH ENDOSCOPIC HARVESTING OF RIGHT SAPHENOUS VEIN (N/A) TRANSESOPHAGEAL ECHOCARDIOGRAM (TEE) (N/A)  Total Length of Stay:  LOS: 1 day   Subjective: Feels okay this morning. No pain.   Objective: Vital signs in last 24 hours: Temp:  [95.9 F (35.5 C)-99.9 F (37.7 C)] 98.1 F (36.7 C) (05/21 0700) Pulse Rate:  [79-90] 80 (05/21 0700) Cardiac Rhythm: Atrial paced (05/21 0400) Resp:  [0-14] 9 (05/20 1745) BP: (91-121)/(49-68) 108/58 (05/21 0700) SpO2:  [96 %-100 %] 99 % (05/21 0700) Arterial Line BP: (77-160)/(39-61) 138/48 (05/21 0700) FiO2 (%):  [40 %-50 %] 40 % (05/20 1809) Weight:  [203 lb 4.8 oz (92.2 kg)] 203 lb 4.8 oz (92.2 kg) (05/21 0500)  Filed Weights   06/28/17 0500  Weight: 203 lb 4.8 oz (92.2 kg)    Weight change:    Hemodynamic parameters for last 24 hours: PAP: (19-31)/(7-18) 27/12 CO:  [4.5 L/min-7.2 L/min] 6.5 L/min CI:  [2.2 L/min/m2-3.5 L/min/m2] 3.2 L/min/m2  Intake/Output from previous day: 05/20 0701 - 05/21 0700 In: 8271.3 [I.V.:5222.3; Blood:499; IV Piggyback:2550] Out: 1749 [Urine:3970; Blood:400; Chest Tube:950]  Intake/Output this shift: No intake/output data recorded.  Current Meds: Scheduled Meds: . acetaminophen  1,000 mg Oral Q6H   Or  . acetaminophen (TYLENOL) oral liquid 160 mg/5 mL  1,000 mg Per Tube Q6H  . aspirin EC  325 mg Oral Daily   Or  . aspirin  324 mg Per Tube Daily  . atorvastatin  80 mg Oral q1800  . bisacodyl  10 mg Oral Daily   Or  . bisacodyl  10 mg Rectal Daily  . chlorhexidine gluconate (MEDLINE KIT)  15 mL Mouth Rinse BID  . Chlorhexidine Gluconate Cloth  6 each Topical Daily  . docusate sodium  200 mg Oral Daily  . finasteride  5 mg Oral Daily  . insulin regular  0-10 Units Intravenous TID WC   . mouth rinse  15 mL Mouth Rinse QID  . metoCLOPramide (REGLAN) injection  10 mg Intravenous Q6H  . metoprolol tartrate  12.5 mg Oral BID   Or  . metoprolol tartrate  12.5 mg Per Tube BID  . [START ON 06/29/2017] pantoprazole  40 mg Oral Daily  . sodium chloride flush  10-40 mL Intracatheter Q12H  . sodium chloride flush  3 mL Intravenous Q12H   Continuous Infusions: . sodium chloride 20 mL/hr at 06/28/17 0700  . sodium chloride 1 mL/hr at 06/28/17 0700  . sodium chloride 20 mL/hr at 06/28/17 0700  . albumin human    . cefUROXime (ZINACEF)  IV 1.5 g (06/28/17 0737)  . dexmedetomidine (PRECEDEX) IV infusion Stopped (06/27/17 1554)  . DOPamine 2.5 mcg/kg/min (06/28/17 0600)  . insulin (NOVOLIN-R) infusion 2.6 Units/hr (06/28/17 0700)  . lactated ringers    . lactated ringers 20 mL/hr at 06/28/17 0700  . milrinone 0.125 mcg/kg/min (06/28/17 0700)  . nitroGLYCERIN Stopped (06/27/17 1235)  . phenylephrine (NEO-SYNEPHRINE) Adult infusion Stopped (06/28/17 0050)   PRN Meds:.sodium chloride, albumin human, metoprolol tartrate, midazolam, morphine injection, ondansetron (ZOFRAN) IV, oxyCODONE, sodium chloride flush, sodium chloride flush, traMADol  General appearance: alert, cooperative and  no distress Heart: regular rate and rhythm, early systolic murmur grade 1-2, click, rub or gallop Lungs: clear to auscultation bilaterally and diminished lower lobes Abdomen: soft, non-tender; bowel sounds normal; no masses,  no organomegaly Extremities: 1-2 + non pitting edema in his upper and lower extremity Wound: dressed with sterile dressing  Lab Results: CBC: Recent Labs    06/27/17 1825 06/27/17 1837 06/28/17 0258  WBC 10.4  --  14.6*  HGB 7.6* 9.2* 7.3*  HCT 22.4* 27.0* 21.4*  PLT 128*  --  121*   BMET:  Recent Labs    06/27/17 1837 06/28/17 0258  NA 140 137  K 3.8 4.5  CL 104 107  CO2  --  25  GLUCOSE 134* 118*  BUN 13 12  CREATININE 1.10 1.15  CALCIUM  --  8.4*     CMET: Lab Results  Component Value Date   WBC 14.6 (H) 06/28/2017   HGB 7.3 (L) 06/28/2017   HCT 21.4 (L) 06/28/2017   PLT 121 (L) 06/28/2017   GLUCOSE 118 (H) 06/28/2017   ALT 15 (L) 06/24/2017   AST 18 06/24/2017   NA 137 06/28/2017   K 4.5 06/28/2017   CL 107 06/28/2017   CREATININE 1.15 06/28/2017   BUN 12 06/28/2017   CO2 25 06/28/2017   INR 1.70 06/27/2017   HGBA1C 5.9 (H) 06/24/2017      PT/INR:  Recent Labs    06/27/17 1242  LABPROT 19.8*  INR 1.70   Radiology: Dg Chest Port 1 View  Result Date: 06/27/2017 CLINICAL DATA:  Status post coronary artery bypass grafting. Hypoxia. EXAM: PORTABLE CHEST 1 VIEW COMPARISON:  Jun 24, 2017 FINDINGS: Endotracheal tube tip is 5.6 cm above the carina. Swan-Ganz catheter tip is in the right main pulmonary outflow tract. There is a mediastinal drain as well as a left chest tube. Nasogastric tube tip and side port are in the stomach. There are temporary pacemaker wires attached to the right heart. No pneumothorax. There is consolidation left lower lobe with small left pleural effusion. Lungs elsewhere clear. Heart is upper normal in size with pulmonary vascularity normal. No adenopathy. No bone lesions. Patient is status post coronary artery bypass grafting. IMPRESSION: Tube and catheter positions as described without pneumothorax. Consolidation, largely due to atelectasis, in the left lower lobe with small left pleural effusion. A degree of pneumonia in the left base cannot be excluded. Lungs elsewhere clear. Heart upper normal in size. Electronically Signed   By: Lowella Grip III M.D.   On: 06/27/2017 13:23     Assessment/Plan: S/P Procedure(s) (LRB): CORONARY ARTERY BYPASS GRAFTING (CABG) x 2  WITH ENDOSCOPIC HARVESTING OF RIGHT SAPHENOUS VEIN (N/A) TRANSESOPHAGEAL ECHOCARDIOGRAM (TEE) (N/A)  1. CV-He remains on milrinone 0.125 and dopamine 2.60mg/kg/min. HR is NSR in the 70s, BP well controlled.  2. Patient was extubated  last night. He is on 2L [place] with excellent oxygen saturation. Encouraged incentive spirometer use. Chest tubes put out [id number] hours.  3. Renal-creatinine 1.15. Electrolytes okay. Fluid balance is positive almost 3L 4. H and H dropped to 7.3/21.4, might be dilutional. Will trend.  5. Blood glucose well controlled on insulin gtt. Can transition to SS[place]nd Levemir.   Plan: transition from insulin gtt to SSI and Levemir. Transfuse 1 unit PRBCs for anemia. Continue to trend chest tube output closely. OOB to chair later today.   TeElgie Collard/21/2019 8:27 AM   patient examined and medical record reviewed,agree with above note. PeTharon Aquasrigt  III 06/29/2017

## 2017-06-28 NOTE — Progress Notes (Signed)
1 Day Post-Op Procedure(s) (LRB): CORONARY ARTERY BYPASS GRAFTING (CABG) x 2  WITH ENDOSCOPIC HARVESTING OF RIGHT SAPHENOUS VEIN (N/A) TRANSESOPHAGEAL ECHOCARDIOGRAM (TEE) (N/A) Subjective: Perioperative coagulopathy and thrombocytopenia, acute symptomatic blood sloss anemia. Hb 7.3- will transfuse 1 unit PRBCs  Stable hemodynamics, nsr Pain well controlled  Objective: Vital signs in last 24 hours: Temp:  [95.9 F (35.5 C)-99.9 F (37.7 C)] 98.1 F (36.7 C) (05/21 0905) Pulse Rate:  [77-93] 77 (05/21 0905) Cardiac Rhythm: Normal sinus rhythm (05/21 0800) Resp:  [0-14] 9 (05/20 1745) BP: (91-121)/(49-68) 113/54 (05/21 0900) SpO2:  [95 %-100 %] 96 % (05/21 0905) Arterial Line BP: (77-160)/(39-61) 128/49 (05/21 0905) FiO2 (%):  [40 %-50 %] 40 % (05/20 1809) Weight:  [203 lb 4.8 oz (92.2 kg)] 203 lb 4.8 oz (92.2 kg) (05/21 0500)  Hemodynamic parameters for last 24 hours: PAP: (19-44)/(7-18) 30/14 CO:  [4.5 L/min-7.2 L/min] 5.6 L/min CI:  [2.2 L/min/m2-3.5 L/min/m2] 2.7 L/min/m2  Intake/Output from previous day: 05/20 0701 - 05/21 0700 In: 8271.3 [I.V.:5222.3; Blood:499; IV Piggyback:2550] Out: 1601 [Urine:3970; Blood:400; Chest Tube:950] Intake/Output this shift: Total I/O In: 142.8 [I.V.:142.8] Out: 290 [Urine:250; Chest Tube:40]       Exam    General- alert and comfortable    Neck- no JVD, no cervical adenopathy palpable, no carotid bruit   Lungs- clear without rales, wheezes   Cor- regular rate and rhythm, no murmur , gallop   Abdomen- soft, non-tender   Extremities - warm, non-tender, minimal edema   Neuro- oriented, appropriate, no focal weakness   Lab Results: Recent Labs    06/27/17 1825 06/27/17 1837 06/28/17 0258  WBC 10.4  --  14.6*  HGB 7.6* 9.2* 7.3*  HCT 22.4* 27.0* 21.4*  PLT 128*  --  121*   BMET:  Recent Labs    06/27/17 1837 06/28/17 0258  NA 140 137  K 3.8 4.5  CL 104 107  CO2  --  25  GLUCOSE 134* 118*  BUN 13 12  CREATININE 1.10  1.15  CALCIUM  --  8.4*    PT/INR:  Recent Labs    06/27/17 1242  LABPROT 19.8*  INR 1.70   ABG    Component Value Date/Time   PHART 7.350 06/27/2017 1940   HCO3 22.0 06/27/2017 1940   TCO2 23 06/27/2017 1940   ACIDBASEDEF 3.0 (H) 06/27/2017 1940   O2SAT 99.0 06/27/2017 1940   CBG (last 3)  Recent Labs    06/28/17 0512 06/28/17 0630 06/28/17 0747  GLUCAP 103* 104* 117*    Assessment/Plan: S/P Procedure(s) (LRB): CORONARY ARTERY BYPASS GRAFTING (CABG) x 2  WITH ENDOSCOPIC HARVESTING OF RIGHT SAPHENOUS VEIN (N/A) TRANSESOPHAGEAL ECHOCARDIOGRAM (TEE) (N/A) Mobilize Diuresis Diabetes control d/c tubes/lines See progression orders transfusion 1 PRBCs for post op anemia   LOS: 1 day    Tharon Aquas Trigt III 06/28/2017

## 2017-06-28 NOTE — Plan of Care (Signed)
  Problem: Clinical Measurements: Goal: Respiratory complications will improve Outcome: Progressing Note:  Patient on room air with O2 saturation at 94%. Goal: Cardiovascular complication will be avoided Outcome: Progressing   Problem: Activity: Goal: Risk for activity intolerance will decrease Outcome: Progressing   Problem: Nutrition: Goal: Adequate nutrition will be maintained Outcome: Progressing   Problem: Elimination: Goal: Will not experience complications related to urinary retention Outcome: Progressing

## 2017-06-29 ENCOUNTER — Inpatient Hospital Stay (HOSPITAL_COMMUNITY): Payer: PPO

## 2017-06-29 LAB — GLUCOSE, CAPILLARY
Glucose-Capillary: 112 mg/dL — ABNORMAL HIGH (ref 65–99)
Glucose-Capillary: 123 mg/dL — ABNORMAL HIGH (ref 65–99)
Glucose-Capillary: 126 mg/dL — ABNORMAL HIGH (ref 65–99)
Glucose-Capillary: 133 mg/dL — ABNORMAL HIGH (ref 65–99)
Glucose-Capillary: 135 mg/dL — ABNORMAL HIGH (ref 65–99)
Glucose-Capillary: 158 mg/dL — ABNORMAL HIGH (ref 65–99)

## 2017-06-29 LAB — POCT I-STAT, CHEM 8
BUN: 17 mg/dL (ref 6–20)
Calcium, Ion: 1.17 mmol/L (ref 1.15–1.40)
Chloride: 93 mmol/L — ABNORMAL LOW (ref 101–111)
Creatinine, Ser: 1.1 mg/dL (ref 0.61–1.24)
Glucose, Bld: 137 mg/dL — ABNORMAL HIGH (ref 65–99)
HCT: 25 % — ABNORMAL LOW (ref 39.0–52.0)
Hemoglobin: 8.5 g/dL — ABNORMAL LOW (ref 13.0–17.0)
Potassium: 3.8 mmol/L (ref 3.5–5.1)
Sodium: 132 mmol/L — ABNORMAL LOW (ref 135–145)
TCO2: 24 mmol/L (ref 22–32)

## 2017-06-29 LAB — CBC
HCT: 23.3 % — ABNORMAL LOW (ref 39.0–52.0)
Hemoglobin: 8 g/dL — ABNORMAL LOW (ref 13.0–17.0)
MCH: 29 pg (ref 26.0–34.0)
MCHC: 34.3 g/dL (ref 30.0–36.0)
MCV: 84.4 fL (ref 78.0–100.0)
Platelets: 104 10*3/uL — ABNORMAL LOW (ref 150–400)
RBC: 2.76 MIL/uL — ABNORMAL LOW (ref 4.22–5.81)
RDW: 13.7 % (ref 11.5–15.5)
WBC: 15.9 10*3/uL — ABNORMAL HIGH (ref 4.0–10.5)

## 2017-06-29 LAB — BASIC METABOLIC PANEL
Anion gap: 6 (ref 5–15)
BUN: 16 mg/dL (ref 6–20)
CO2: 26 mmol/L (ref 22–32)
Calcium: 8.3 mg/dL — ABNORMAL LOW (ref 8.9–10.3)
Chloride: 100 mmol/L — ABNORMAL LOW (ref 101–111)
Creatinine, Ser: 1.21 mg/dL (ref 0.61–1.24)
GFR calc Af Amer: >60 mL/min (ref >60)
GFR calc non Af Amer: 58 mL/min — ABNORMAL LOW (ref >60)
Glucose, Bld: 130 mg/dL — ABNORMAL HIGH (ref 65–99)
Potassium: 4.2 mmol/L (ref 3.5–5.1)
Sodium: 132 mmol/L — ABNORMAL LOW (ref 135–145)

## 2017-06-29 MED ORDER — AMIODARONE HCL 200 MG PO TABS
200.0000 mg | ORAL_TABLET | Freq: Two times a day (BID) | ORAL | Status: DC
  Administered 2017-06-29 – 2017-06-30 (×2): 200 mg via ORAL
  Filled 2017-06-29 (×2): qty 1

## 2017-06-29 MED ORDER — AMIODARONE HCL IN DEXTROSE 360-4.14 MG/200ML-% IV SOLN
30.0000 mg/h | INTRAVENOUS | Status: AC
  Administered 2017-06-29: 30 mg/h via INTRAVENOUS
  Filled 2017-06-29: qty 200

## 2017-06-29 MED ORDER — METOPROLOL TARTRATE 25 MG PO TABS
25.0000 mg | ORAL_TABLET | Freq: Two times a day (BID) | ORAL | Status: DC
  Administered 2017-06-29 – 2017-07-02 (×7): 25 mg via ORAL
  Filled 2017-06-29 (×7): qty 1

## 2017-06-29 MED ORDER — SORBITOL 70 % PO SOLN
30.0000 mL | Freq: Once | ORAL | Status: DC
  Filled 2017-06-29: qty 30

## 2017-06-29 MED ORDER — AMIODARONE IV BOLUS ONLY 150 MG/100ML
150.0000 mg | Freq: Once | INTRAVENOUS | Status: AC
  Administered 2017-06-29: 150 mg via INTRAVENOUS
  Filled 2017-06-29: qty 100

## 2017-06-29 MED ORDER — AMIODARONE HCL IN DEXTROSE 360-4.14 MG/200ML-% IV SOLN
60.0000 mg/h | INTRAVENOUS | Status: DC
  Administered 2017-06-29: 60 mg/h via INTRAVENOUS
  Filled 2017-06-29: qty 200

## 2017-06-29 MED ORDER — FUROSEMIDE 10 MG/ML IJ SOLN
20.0000 mg | Freq: Two times a day (BID) | INTRAMUSCULAR | Status: DC
  Administered 2017-06-29 (×2): 20 mg via INTRAVENOUS
  Filled 2017-06-29 (×3): qty 2

## 2017-06-29 MED ORDER — FE FUMARATE-B12-VIT C-FA-IFC PO CAPS
1.0000 | ORAL_CAPSULE | Freq: Three times a day (TID) | ORAL | Status: DC
  Administered 2017-06-29 – 2017-07-04 (×16): 1 via ORAL
  Filled 2017-06-29 (×16): qty 1

## 2017-06-29 MED ORDER — METOPROLOL TARTRATE 25 MG/10 ML ORAL SUSPENSION: 12.5000 mg | Freq: Two times a day (BID) | ORAL | Status: DC

## 2017-06-29 MED FILL — Sodium Bicarbonate IV Soln 8.4%: INTRAVENOUS | Qty: 50 | Status: AC

## 2017-06-29 MED FILL — Sodium Chloride IV Soln 0.9%: INTRAVENOUS | Qty: 2000 | Status: AC

## 2017-06-29 MED FILL — Mannitol IV Soln 20%: INTRAVENOUS | Qty: 500 | Status: AC

## 2017-06-29 MED FILL — Lidocaine HCl Local Soln Prefilled Syringe 100 MG/5ML (2%): INTRAMUSCULAR | Qty: 10 | Status: AC

## 2017-06-29 MED FILL — Electrolyte-R (PH 7.4) Solution: INTRAVENOUS | Qty: 4000 | Status: AC

## 2017-06-29 NOTE — Progress Notes (Signed)
CT surgery p.m. Rounds  Patient had stable day Maintaining sinus rhythm on IV amiodarone which is transition to oral dosing Walking in hallway Should be ready for transfer to stepdown tomorrow

## 2017-06-29 NOTE — Progress Notes (Signed)
Paged Dr. Roxy Manns regarding patient in a fib per EKG. Ordered to give p.r.n. Metoprolol and start amiodarone per protocol if HR > 150.

## 2017-06-29 NOTE — Progress Notes (Signed)
2 Days Post-Op Procedure(s) (LRB): CORONARY ARTERY BYPASS GRAFTING (CABG) x 2  WITH ENDOSCOPIC HARVESTING OF RIGHT SAPHENOUS VEIN (N/A) TRANSESOPHAGEAL ECHOCARDIOGRAM (TEE) (N/A) Subjective: Chest tube output much umproved  Afib this am now on iv amiodarone  Objective: Vital signs in last 24 hours: Temp:  [97.9 F (36.6 C)-98.2 F (36.8 C)] 97.9 F (36.6 C) (05/22 1207) Pulse Rate:  [63-93] 65 (05/22 1505) Cardiac Rhythm: Normal sinus rhythm (05/22 1200) Resp:  [0-18] 12 (05/22 1505) BP: (101-141)/(55-84) 131/59 (05/22 1505) SpO2:  [87 %-98 %] 97 % (05/22 1505) Weight:  [212 lb 1.3 oz (96.2 kg)] 212 lb 1.3 oz (96.2 kg) (05/22 0500)  Hemodynamic parameters for last 24 hours:    Intake/Output from previous day: 05/21 0701 - 05/22 0700 In: 1860.6 [I.V.:1045.6; Blood:315; IV Piggyback:500] Out: 2345 [Urine:1775; Chest Tube:570] Intake/Output this shift: Total I/O In: 299.9 [I.V.:299.9] Out: -        Exam    General- alert and comfortable    Neck- no JVD, no cervical adenopathy palpable, no carotid bruit   Lungs- clear without rales, wheezes   Cor- regular rate and rhythm, no murmur , gallop   Abdomen- soft, non-tender   Extremities - warm, non-tender, minimal edema   Neuro- oriented, appropriate, no focal weakness   Lab Results: Recent Labs    06/28/17 1720 06/29/17 0217  WBC 17.2* 15.9*  HGB 8.5*  8.2* 8.0*  HCT 25.1*  24.0* 23.3*  PLT 125* 104*   BMET:  Recent Labs    06/28/17 0258 06/28/17 1720 06/29/17 0217  NA 137 133* 132*  K 4.5 4.1 4.2  CL 107 96* 100*  CO2 25  --  26  GLUCOSE 118* 150* 130*  BUN 12 15 16   CREATININE 1.15 1.30*  1.10 1.21  CALCIUM 8.4*  --  8.3*    PT/INR:  Recent Labs    06/27/17 1242  LABPROT 19.8*  INR 1.70   ABG    Component Value Date/Time   PHART 7.350 06/27/2017 1940   HCO3 22.0 06/27/2017 1940   TCO2 24 06/28/2017 1720   ACIDBASEDEF 3.0 (H) 06/27/2017 1940   O2SAT 99.0 06/27/2017 1940   CBG (last 3)   Recent Labs    06/29/17 0347 06/29/17 0814 06/29/17 1204  GLUCAP 133* 112* 135*    Assessment/Plan: S/P Procedure(s) (LRB): CORONARY ARTERY BYPASS GRAFTING (CABG) x 2  WITH ENDOSCOPIC HARVESTING OF RIGHT SAPHENOUS VEIN (N/A) TRANSESOPHAGEAL ECHOCARDIOGRAM (TEE) (N/A) d/c tubes/lines keep in ICU to treat new onset afib   LOS: 2 days    Tharon Aquas Trigt III 06/29/2017

## 2017-06-29 NOTE — Plan of Care (Signed)
  Problem: Clinical Measurements: Goal: Respiratory complications will improve Outcome: Progressing Goal: Cardiovascular complication will be avoided Outcome: Progressing   Problem: Activity: Goal: Risk for activity intolerance will decrease Outcome: Progressing   Problem: Nutrition: Goal: Adequate nutrition will be maintained Outcome: Progressing   Problem: Elimination: Goal: Will not experience complications related to urinary retention Outcome: Progressing Note:  Patient's Foley taken out and patient voiding in urinal.   Problem: Activity: Goal: Risk for activity intolerance will decrease Outcome: Progressing   Problem: Cardiac: Goal: Hemodynamic stability will improve Outcome: Progressing   Problem: Respiratory: Goal: Respiratory status will improve Outcome: Progressing

## 2017-06-30 LAB — GLUCOSE, CAPILLARY
Glucose-Capillary: 111 mg/dL — ABNORMAL HIGH (ref 65–99)
Glucose-Capillary: 116 mg/dL — ABNORMAL HIGH (ref 65–99)
Glucose-Capillary: 117 mg/dL — ABNORMAL HIGH (ref 65–99)
Glucose-Capillary: 118 mg/dL — ABNORMAL HIGH (ref 65–99)
Glucose-Capillary: 122 mg/dL — ABNORMAL HIGH (ref 65–99)
Glucose-Capillary: 124 mg/dL — ABNORMAL HIGH (ref 65–99)
Glucose-Capillary: 134 mg/dL — ABNORMAL HIGH (ref 65–99)

## 2017-06-30 LAB — BASIC METABOLIC PANEL
Anion gap: 10 (ref 5–15)
BUN: 15 mg/dL (ref 6–20)
CO2: 26 mmol/L (ref 22–32)
Calcium: 8.2 mg/dL — ABNORMAL LOW (ref 8.9–10.3)
Chloride: 97 mmol/L — ABNORMAL LOW (ref 101–111)
Creatinine, Ser: 1.18 mg/dL (ref 0.61–1.24)
GFR calc Af Amer: >60 mL/min (ref >60)
GFR calc non Af Amer: 59 mL/min — ABNORMAL LOW (ref >60)
Glucose, Bld: 124 mg/dL — ABNORMAL HIGH (ref 65–99)
Potassium: 3.6 mmol/L (ref 3.5–5.1)
Sodium: 133 mmol/L — ABNORMAL LOW (ref 135–145)

## 2017-06-30 LAB — CBC
HCT: 27 % — ABNORMAL LOW (ref 39.0–52.0)
Hemoglobin: 9.1 g/dL — ABNORMAL LOW (ref 13.0–17.0)
MCH: 28.3 pg (ref 26.0–34.0)
MCHC: 33.7 g/dL (ref 30.0–36.0)
MCV: 84.1 fL (ref 78.0–100.0)
Platelets: 145 10*3/uL — ABNORMAL LOW (ref 150–400)
RBC: 3.21 MIL/uL — ABNORMAL LOW (ref 4.22–5.81)
RDW: 13.5 % (ref 11.5–15.5)
WBC: 16 10*3/uL — ABNORMAL HIGH (ref 4.0–10.5)

## 2017-06-30 MED ORDER — FUROSEMIDE 40 MG PO TABS
40.0000 mg | ORAL_TABLET | Freq: Every day | ORAL | Status: DC
  Administered 2017-06-30 – 2017-07-02 (×3): 40 mg via ORAL
  Filled 2017-06-30 (×3): qty 1

## 2017-06-30 MED ORDER — MOVING RIGHT ALONG BOOK
Freq: Once | Status: AC
  Administered 2017-06-30: 11:00:00
  Filled 2017-06-30: qty 1

## 2017-06-30 MED ORDER — SODIUM CHLORIDE 0.9% FLUSH
3.0000 mL | Freq: Two times a day (BID) | INTRAVENOUS | Status: DC
  Administered 2017-06-30 – 2017-07-03 (×5): 3 mL via INTRAVENOUS

## 2017-06-30 MED ORDER — AMIODARONE HCL 200 MG PO TABS
400.0000 mg | ORAL_TABLET | Freq: Two times a day (BID) | ORAL | Status: DC
  Administered 2017-06-30 – 2017-07-04 (×8): 400 mg via ORAL
  Filled 2017-06-30 (×8): qty 2

## 2017-06-30 MED ORDER — INSULIN ASPART 100 UNIT/ML ~~LOC~~ SOLN
0.0000 [IU] | Freq: Three times a day (TID) | SUBCUTANEOUS | Status: DC
  Administered 2017-06-30 (×2): 2 [IU] via SUBCUTANEOUS

## 2017-06-30 MED ORDER — SODIUM CHLORIDE 0.9 % IV SOLN: 250.0000 mL | INTRAVENOUS | Status: DC | PRN

## 2017-06-30 MED ORDER — AMIODARONE HCL IN DEXTROSE 360-4.14 MG/200ML-% IV SOLN
30.0000 mg/h | INTRAVENOUS | Status: AC
  Administered 2017-06-30: 30 mg/h via INTRAVENOUS
  Filled 2017-06-30: qty 200

## 2017-06-30 MED ORDER — MAGNESIUM HYDROXIDE 400 MG/5ML PO SUSP: 30.0000 mL | Freq: Every day | ORAL | Status: DC | PRN

## 2017-06-30 MED ORDER — SODIUM CHLORIDE 0.9% FLUSH: 3.0000 mL | INTRAVENOUS | Status: DC | PRN

## 2017-06-30 MED ORDER — POTASSIUM CHLORIDE 10 MEQ/50ML IV SOLN
10.0000 meq | INTRAVENOUS | Status: DC
  Administered 2017-06-30 (×2): 10 meq via INTRAVENOUS
  Filled 2017-06-30 (×2): qty 50

## 2017-06-30 MED ORDER — POTASSIUM CHLORIDE CRYS ER 20 MEQ PO TBCR
20.0000 meq | EXTENDED_RELEASE_TABLET | Freq: Two times a day (BID) | ORAL | Status: DC
  Administered 2017-06-30 – 2017-07-02 (×6): 20 meq via ORAL
  Filled 2017-06-30 (×6): qty 1

## 2017-06-30 NOTE — Progress Notes (Signed)
3 Days Post-Op Procedure(s) (LRB): CORONARY ARTERY BYPASS GRAFTING (CABG) x 2  WITH ENDOSCOPIC HARVESTING OF RIGHT SAPHENOUS VEIN (N/A) TRANSESOPHAGEAL ECHOCARDIOGRAM (TEE) (N/A) Subjective: Stable, remains NSR Transfer to floor  Objective: Vital signs in last 24 hours: Temp:  [97.9 F (36.6 C)-98.3 F (36.8 C)] 98.1 F (36.7 C) (05/23 0731) Pulse Rate:  [63-110] 101 (05/23 0706) Cardiac Rhythm: Normal sinus rhythm;Atrial fibrillation (05/23 0400) Resp:  [9-22] 11 (05/23 0706) BP: (83-144)/(45-83) 118/70 (05/23 0706) SpO2:  [92 %-100 %] 92 % (05/23 0706) Weight:  [197 lb 8 oz (89.6 kg)] 197 lb 8 oz (89.6 kg) (05/23 0500)  Hemodynamic parameters for last 24 hours:  nsr  Intake/Output from previous day: 05/22 0701 - 05/23 0700 In: 2303.3 [P.O.:480; I.V.:1773.3; IV Piggyback:50] Out: 1675 [Urine:1675] Intake/Output this shift: Total I/O In: 50 [IV Piggyback:50] Out: 300 [Urine:300]       Exam    General- alert and comfortable    Neck- no JVD, no cervical adenopathy palpable, no carotid bruit   Lungs- clear without rales, wheezes   Cor- regular rate and rhythm, no murmur , gallop   Abdomen- soft, non-tender   Extremities - warm, non-tender, minimal edema   Neuro- oriented, appropriate, no focal weakness   Lab Results: Recent Labs    06/29/17 0217 06/29/17 1710 06/30/17 0312  WBC 15.9*  --  16.0*  HGB 8.0* 8.5* 9.1*  HCT 23.3* 25.0* 27.0*  PLT 104*  --  145*   BMET:  Recent Labs    06/29/17 0217 06/29/17 1710 06/30/17 0312  NA 132* 132* 133*  K 4.2 3.8 3.6  CL 100* 93* 97*  CO2 26  --  26  GLUCOSE 130* 137* 124*  BUN 16 17 15   CREATININE 1.21 1.10 1.18  CALCIUM 8.3*  --  8.2*    PT/INR:  Recent Labs    06/27/17 1242  LABPROT 19.8*  INR 1.70   ABG    Component Value Date/Time   PHART 7.350 06/27/2017 1940   HCO3 22.0 06/27/2017 1940   TCO2 24 06/29/2017 1710   ACIDBASEDEF 3.0 (H) 06/27/2017 1940   O2SAT 99.0 06/27/2017 1940   CBG (last  3)  Recent Labs    06/30/17 0001 06/30/17 0313 06/30/17 0736  GLUCAP 134* 117* 118*    Assessment/Plan: S/P Procedure(s) (LRB): CORONARY ARTERY BYPASS GRAFTING (CABG) x 2  WITH ENDOSCOPIC HARVESTING OF RIGHT SAPHENOUS VEIN (N/A) TRANSESOPHAGEAL ECHOCARDIOGRAM (TEE) (N/A) Mobilize Diuresis Plan for transfer to step-down: see transfer orders   LOS: 3 days    Chase Guerrero 06/30/2017

## 2017-06-30 NOTE — Care Management Note (Signed)
Case Management Note Marvetta Gibbons RN,BSN Unit [facility] 1-22 Case Manager  [phone]  Patient Details  Name: Chase Guerrero MRN: [id number] Date of Birth: 1946/01/27  Subjective/Objective:  Pt admitted s/p CABGx2                  Action/Plan: PTA pt lived at home with spouse- anticipate return home- CM to follow for transition of care needs  Expected Discharge Date:                  Expected Discharge Plan:  Home/Self Care  In-House Referral:     Discharge planning Services  CM Consult  Post Acute Care Choice:    Choice offered to:     DME Arranged:    DME Agency:     HH Arranged:    [place] Agency:     Status of Service:  In process, will continue to follow  If discussed at Long Length of Stay Meetings, dates discussed:    Discharge Disposition:   Additional Comments:  Dawayne Patricia, RN 06/30/2017, 10:39 AM

## 2017-06-30 NOTE — Progress Notes (Signed)
Patient to 4E room 10 at this time. Telemetry placed and CCMD notified of patient's arrival. V/S done. Patient assessment done and patient had no questions at this time. Patient oriented to room and how to call the nurse with any needs. Will continue to monitor.   Emelda Fear, RN

## 2017-07-01 ENCOUNTER — Inpatient Hospital Stay (HOSPITAL_COMMUNITY): Payer: PPO

## 2017-07-01 LAB — TYPE AND SCREEN
ABO/RH(D): O POS
Antibody Screen: NEGATIVE
Unit division: 0
Unit division: 0
Unit division: 0
Unit division: 0
Unit division: 0

## 2017-07-01 LAB — CBC
HCT: 27.3 % — ABNORMAL LOW (ref 39.0–52.0)
Hemoglobin: 9.2 g/dL — ABNORMAL LOW (ref 13.0–17.0)
MCH: 29.1 pg (ref 26.0–34.0)
MCHC: 33.7 g/dL (ref 30.0–36.0)
MCV: 86.4 fL (ref 78.0–100.0)
Platelets: 201 10*3/uL (ref 150–400)
RBC: 3.16 MIL/uL — ABNORMAL LOW (ref 4.22–5.81)
RDW: 13.9 % (ref 11.5–15.5)
WBC: 13.5 10*3/uL — ABNORMAL HIGH (ref 4.0–10.5)

## 2017-07-01 LAB — BASIC METABOLIC PANEL
Anion gap: 7 (ref 5–15)
BUN: 16 mg/dL (ref 6–20)
CO2: 30 mmol/L (ref 22–32)
Calcium: 8.4 mg/dL — ABNORMAL LOW (ref 8.9–10.3)
Chloride: 101 mmol/L (ref 101–111)
Creatinine, Ser: 1.34 mg/dL — ABNORMAL HIGH (ref 0.61–1.24)
GFR calc Af Amer: 59 mL/min — ABNORMAL LOW (ref >60)
GFR calc non Af Amer: 51 mL/min — ABNORMAL LOW (ref >60)
Glucose, Bld: 108 mg/dL — ABNORMAL HIGH (ref 65–99)
Potassium: 4.1 mmol/L (ref 3.5–5.1)
Sodium: 138 mmol/L (ref 135–145)

## 2017-07-01 LAB — BPAM RBC
Blood Product Expiration Date: 201905282359
Blood Product Expiration Date: 201906102359
Blood Product Expiration Date: 201906112359
Blood Product Expiration Date: 201906112359
Blood Product Expiration Date: 201906112359
ISSUE DATE / TIME: 201905210842
ISSUE DATE / TIME: 201905221002
ISSUE DATE / TIME: 201905221118
Unit Type and Rh: 5100
Unit Type and Rh: 5100
Unit Type and Rh: 5100
Unit Type and Rh: 5100
Unit Type and Rh: 5100

## 2017-07-01 LAB — GLUCOSE, CAPILLARY: Glucose-Capillary: 96 mg/dL (ref 65–99)

## 2017-07-01 NOTE — Care Management Important Message (Signed)
Important Message  Patient Details  Name: Chase Guerrero MRN: [id number] Date of Birth: 01-Sep-1945   Medicare Important Message Given:  Yes    [name] 07/01/2017, 3:23 PM

## 2017-07-01 NOTE — Progress Notes (Signed)
Patient converted back to SR.

## 2017-07-01 NOTE — Discharge Summary (Addendum)
Physician Discharge Summary  Patient ID: Chase Guerrero MRN: [id number] DOB/AGE: 73/10/1945 72 y.o.  Admit date: 06/27/2017 Discharge date: 07/04/2017  Admission Diagnoses:  Patient Active Problem List   Diagnosis Date Noted  . Angina at rest [facility]) 06/14/2017  . Abnormal nuclear stress test 06/14/2017  . Family history of premature CAD 06/14/2017  . History of colonic polyps   . Diverticulosis of colon without hemorrhage   . Obstructive sleep apnea 02/20/2008   Discharge Diagnoses:   Patient Active Problem List   Diagnosis Date Noted  . 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 Angina at rest [facility]) 06/14/2017  . Abnormal nuclear stress test 06/14/2017  . Family history of premature CAD 06/14/2017  . History of colonic polyps   . Diverticulosis of colon without hemorrhage   . Obstructive sleep apnea 02/20/2008   Discharged Condition: good  History of Present Illness:  Chase Guerrero is a 72 yo male with known history of nicotine abuse, Hyperlipidemia, and OSA.  Approximately 2 months ago the patient developed chest pain and pressure.  The episode lasted approximately 2 hours and radiated into his right shoulder and neck area.  It resolved and did not occur again.  During a routine visit to his PCP the patient mentioned the episode.  An EKG was obtained which showed evidence of ischemic changes.  Due to this he was referred to Cardiology for evaluation.  He was evaluated by Dr. Harl Bowie who recommended stress testing which was positive.  It was felt cardiac catheterization would be indicated.  This was performed by Dr. 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  It was felt coronary bypass grafting would be indicated and he was referred to [facility].  He was evaluated by Dr. Prescott Gum who was in agreement bypass surgery would be the patient's best treatment option.  The risks and benefits of the procedure were explained to the patient and he was agreeable to  proceed.  Hospital Course:   Chase Guerrero presented to [facility] on 06/27/2017.  He was taken to the operating room and underwent CABG x 2 utilizing LIMA to LAD and SVG to Diagonal.  He also underwent endoscopic harvest of greater saphenous vein from his right thigh.  He tolerated the procedure without difficulty and was taken to the SICU in stable condition.  He was extubated the evening of surgery.  During his stay in the SICU the patient was weaned off Milrinone and Dopamine as hemodynamics allowed.  His chest tubes and arterial lines were removed without difficulty.  He was transfused 1 pack of packed cells for expected post operative blood loss anemia. He developed Atrial Fibrillation and was treated with IV Amiodarone per protocol.  He successfully converted to NSR.  He was ambulating in the SICU without difficulty and was medically stable for transfer to the telemetry unit on 06/30/2017.  The patient continues to make good progress.  He initially maintained NSR without difficulty.  His pacing wires have been removed without difficulty.  He unfortunately converted into rate controlled Atrial Fibrillation.  He would continue to flip back and forth between NSR and rate controlled A. Fib.  He was started on Xarelto for stroke risk. He developed an increase in his creatinine level.  His lasix and potassium were discontinued and his creatinine normalized. Patient is prediabetic.  His blood sugars have been controlled during hospitalization and he will require close follow up with PCP at discharge.  He  continues to ambulate without difficulty.  His pain is well controlled.  He is tolerating a heart healthy diet.  He is felt to be medically stable for discharge home today.          Significant Diagnostic Studies: angiography:    Dist LM to Ost LAD lesion is 100% stenosed -proximal to Diag 1. Collaterals fill from RPDA-LAD septals onto the apex and then competitive flow from collaterals from LCx-apical  LAD  The left ventricular ejection fraction is 35-45% by visual estimate. Mid to apical anterior hypokinesis with preserved apical contraction.  LV end diastolic pressure is normal.   Severe single-vessel disease with totally occluded ostial LAD with almost the entire LAD being filled via right to left and left left collaterals. Only mildly reduced LVEF with normal EDP.   Plan: Patient is best suited for LIMA-LAD and possible SVG-Diag.    Since he is not actively having Angina, we will arrange for OP CVTS Consultation for CABG Start high dose Atorvastatin & low dose Metoprolol 25 mg bid.  Treatments: surgery:   1.  Coronary artery bypass grafting x2 (left internal mammary artery to left anterior descending, saphenous vein graft to diagonal)  2.  Endoscopic harvest of right leg greater saphenous vein.  Discharge Exam: Blood pressure 119/69, pulse 76, temperature 98.6 F (37 C), temperature source Oral, resp. rate 12, height 5\' 10"  (1.778 m), weight 191 lb 6.4 oz (86.8 kg), SpO2 93 %.   General appearance: alert, cooperative and no distress Heart: irregularly irregular rhythm Lungs: clear to auscultation bilaterally Abdomen: soft, non-tender; bowel sounds normal; no masses,  no organomegaly Extremities: edema trace Wound: clean and dry   Disposition: Home  Discharge Medications:  The patient has been discharged on:   1.Beta Blocker:  Yes [ x  ]                              No   [   ]                              If No, reason:  2.Ace Inhibitor/ARB: Yes [   ]                                     No  [   x ]                                     If No, reason: labile BP  3.Statin:   Yes [ x  ]                  No  [   ]                  If No, reason:  4.Ecasa:  Yes  [ x  ]                  No   [   ]                  If No, reason:     Discharge Instructions    Amb Referral to Cardiac Rehabilitation   Complete by:  As directed    Diagnosis:  CABG   CABG X  ___:  2     Allergies as of 07/04/2017   No Known Allergies     Medication List    TAKE these medications   acetaminophen 500 MG tablet Commonly known as:  TYLENOL Take 2 tablets (1,000 mg total) by mouth every 6 (six) hours as needed for mild pain or fever.   amiodarone 200 MG tablet Commonly known as:  PACERONE Take 2 tablets (400 mg total) by mouth 2 (two) times daily. X 7 days then decrease to 200 mg 2 times daily   aspirin EC 81 MG tablet Take 81 mg by mouth daily.   atorvastatin 80 MG tablet Commonly known as:  LIPITOR Take 1 tablet (80 mg total) by mouth daily.   BIOFREEZE EX Apply 1 application topically daily as needed (pain).   cetirizine 10 MG tablet Commonly known as:  ZYRTEC Take 10 mg by mouth daily as needed for allergies.   finasteride 5 MG tablet Commonly known as:  PROSCAR Take 5 mg by mouth daily.   fluticasone 50 MCG/ACT nasal spray Commonly known as:  FLONASE Place 1 spray into both nostrils daily as needed for allergies or rhinitis.   Melatonin 3 MG Tabs Take 3 mg by mouth daily as needed (sleep).   metoprolol tartrate 50 MG tablet Commonly known as:  LOPRESSOR Take 1 tablet (50 mg total) by mouth 2 (two) times daily. What changed:    medication strength  how much to take   nitroGLYCERIN 0.4 MG SL tablet Commonly known as:  NITROSTAT Place 0.4 mg under the tongue every 5 (five) minutes as needed for chest pain.   OVER THE COUNTER MEDICATION Apply 1 application topically daily as needed (pain). [place] Gel otc pain gel   oxyCODONE 5 MG immediate release tablet Commonly known as:  Oxy IR/ROXICODONE Take 1 tablet (5 mg total) by mouth every 4 (four) hours as needed for severe pain.   potassium chloride SA 20 MEQ tablet Commonly known as:  K-DUR,KLOR-CON Take 1 tablet (20 mEq total) by mouth daily.   ranitidine 150 MG tablet Commonly known as:  ZANTAC Take 150 mg by mouth daily as needed for heartburn.   rivaroxaban 20 MG Tabs  tablet Commonly known as:  XARELTO Take 1 tablet (20 mg total) by mouth daily.   SENSITIVE EYES SALINE Soln Place 1 drop into both eyes 3 (three) times daily.      Follow-up Information    Ivin Poot, MD Follow up on 08/03/2017.   Specialty:  Cardiothoracic Surgery Why:  Appointment is at 10:00, please get CXR at 9:30 located at [place] on the first floor of our office building Contact information: [place] [address] [phone]        [name], MD Follow up.   Specialty:  Cardiology Why:  You have a cardiology hospital follow up on June 5th at 10:40 with Dr. Harl Bowie at the [facility] office.  Contact information: [place] [address] [phone]           Signed: Ellwood Handler 07/04/2017, 7:40 AM   patient examined and medical record reviewed,agree with above note. Tharon Aquas Trigt III 07/09/2017

## 2017-07-01 NOTE — Progress Notes (Signed)
Pacing wires removed at this time. Patient tolerated well. Pt on bedrest per protocol. Patient verbalizes understanding. Will continue to monitor.  Emelda Fear, RN

## 2017-07-01 NOTE — Progress Notes (Addendum)
      [address]       [place] [address]             [phone]        4 Days Post-Op Procedure(s) (LRB): CORONARY ARTERY BYPASS GRAFTING (CABG) x 2  WITH ENDOSCOPIC HARVESTING OF RIGHT SAPHENOUS VEIN (N/A) TRANSESOPHAGEAL ECHOCARDIOGRAM (TEE) (N/A)  Subjective: Patient sleeping-awakened. He has no specific complaints this am.  Objective: Vital signs in last 24 hours: Temp:  [97.8 F (36.6 C)-98.3 F (36.8 C)] 97.8 F (36.6 C) (05/24 0610) Pulse Rate:  [67-151] 93 (05/23 1707) Cardiac Rhythm: Normal sinus rhythm (05/23 2247) Resp:  [11-19] 12 (05/24 0610) BP: (105-127)/(54-91) 111/62 (05/24 0610) SpO2:  [85 %-99 %] 99 % (05/24 0610) Weight:  [194 lb (88 kg)] 194 lb (88 kg) (05/24 0610)  Pre op weight  92.2 kg Current Weight  07/01/17 194 lb (88 kg)       Intake/Output from previous day: 05/23 0701 - 05/24 0700 In: 290 [P.O.:240; IV Piggyback:50] Out: 500 [Urine:500]   Physical Exam:  Cardiovascular: RRR Pulmonary: Clear to auscultation bilaterally Abdomen: Soft, non tender, bowel sounds present. Extremities: Mild bilateral lower extremity edema. Wounds: Clean and dry.  No erythema or signs of infection.  Lab Results: CBC: Recent Labs    06/30/17 0312 07/01/17 0407  WBC 16.0* 13.5*  HGB 9.1* 9.2*  HCT 27.0* 27.3*  PLT 145* 201   BMET:  Recent Labs    06/30/17 0312 07/01/17 0407  NA 133* 138  K 3.6 4.1  CL 97* 101  CO2 26 30  GLUCOSE 124* 108*  BUN 15 16  CREATININE 1.18 1.34*  CALCIUM 8.2* 8.4*    PT/INR:  Lab Results  Component Value Date   INR 1.70 06/27/2017   INR 1.05 06/24/2017   INR 1.04 06/10/2017   ABG:  INR: Will add last result for INR, ABG once components are confirmed Will add last 4 CBG results once components are confirmed  Assessment/Plan:  1. CV - Previous a fib. SR On Lopressor 25 mg bid and Amiodarone 400 mg bid. 2.  Pulmonary - On room air. Encourage incentive spirometer. 3. Volume  Overload - On Lasix 40 mg daily 4.  Acute blood loss anemia - H and H stable at 9.2 and 27.3. On Trinsicon 5. Creatinine slightly increased to 1.34 6. CBGs 111/124/96. On Insulin. Pre op HGA1C 5.9. No prior history of diabetes. Patient is likely pre diabetic. Will stop accu checks and SS PRN and provided diet information with discharge instructions. 7. Remove EPW 8. Likely home this weekend if continues to maintain SR  Sharalyn Ink ZimmermanPA-C 07/01/2017,7:46 AM [phone]  Home in a.m. patient examined and medical record reviewed,agree with above note. Tharon Aquas Trigt III 07/01/2017

## 2017-07-01 NOTE — Progress Notes (Signed)
CARDIAC REHAB PHASE I   PRE:  Rate/Rhythm: Sinus 79  BP:  Supine: 111/55     SaO2: 97% Room Air  MODE:  Ambulation: 600 ft   POST:  Rate/Rhythem: 82  BP:    Sitting: 97/57     SaO2: 96% Room Air  1340-1435 Patient ambulated in the hallway without complaints. Education completed regarding sternal precautions. Exercise guidelines, temperature precautions and end points of exercise. Chase Guerrero is interested in participating in phase 2 cardiac rehab at [facility]. Reviewed heart healthy diet information with the patient and his family.   Harrell Gave RN BSN

## 2017-07-02 ENCOUNTER — Other Ambulatory Visit: Payer: Self-pay

## 2017-07-02 MED ORDER — RIVAROXABAN 20 MG PO TABS
20.0000 mg | ORAL_TABLET | Freq: Every day | ORAL | Status: DC
  Administered 2017-07-02 – 2017-07-04 (×3): 20 mg via ORAL
  Filled 2017-07-02 (×3): qty 1

## 2017-07-02 MED ORDER — AMIODARONE HCL 200 MG PO TABS: 400.0000 mg | ORAL_TABLET | Freq: Two times a day (BID) | ORAL | 1 refills | Status: DC

## 2017-07-02 MED ORDER — OXYCODONE HCL 5 MG PO TABS: 5.0000 mg | ORAL_TABLET | ORAL | 0 refills | Status: DC | PRN

## 2017-07-02 MED ORDER — POTASSIUM CHLORIDE CRYS ER 20 MEQ PO TBCR: 20.0000 meq | EXTENDED_RELEASE_TABLET | Freq: Every day | ORAL | 0 refills | Status: DC

## 2017-07-02 MED ORDER — RIVAROXABAN 20 MG PO TABS: 20.0000 mg | ORAL_TABLET | Freq: Every day | ORAL | 3 refills | Status: DC

## 2017-07-02 MED ORDER — ACETAMINOPHEN 500 MG PO TABS: 1000.0000 mg | ORAL_TABLET | Freq: Four times a day (QID) | ORAL | 0 refills | Status: DC | PRN

## 2017-07-02 MED ORDER — METOPROLOL TARTRATE 12.5 MG HALF TABLET
37.5000 mg | ORAL_TABLET | Freq: Two times a day (BID) | ORAL | Status: DC
  Administered 2017-07-02: 37.5 mg via ORAL
  Filled 2017-07-02: qty 1

## 2017-07-02 MED ORDER — METOPROLOL TARTRATE 12.5 MG HALF TABLET
12.5000 mg | ORAL_TABLET | Freq: Once | ORAL | Status: AC
  Administered 2017-07-02: 12.5 mg via ORAL
  Filled 2017-07-02: qty 1

## 2017-07-02 MED ORDER — FUROSEMIDE 40 MG PO TABS: 40.0000 mg | ORAL_TABLET | Freq: Every day | ORAL | 0 refills | Status: DC

## 2017-07-02 NOTE — Care Management Note (Signed)
Case Management Note  Patient Details  Name: Thelonious Kauffmann MRN: [id number] Date of Birth: 08-Oct-1945  Subjective/Objective:                 Patient provided with 30 day Xaralto card. He understands to ask pharmacist what cost will be next month w/o coupon, and if cost is a barrier to notify office Tueasday when they open and to seek alternative or samples for next month.   Action/Plan:   Expected Discharge Date:  07/02/17               Expected Discharge Plan:  Home/Self Care  In-House Referral:     Discharge planning Services  CM Consult, Medication Assistance  Post Acute Care Choice:    Choice offered to:     DME Arranged:    DME Agency:     HH Arranged:    HH Agency:     Status of Service:  Completed, signed off  If discussed at [employer] of Stay Meetings, dates discussed:    Additional Comments:  Carles Collet, RN 07/02/2017, 9:51 AM

## 2017-07-02 NOTE — Progress Notes (Signed)
Chest tube sutures removed. Benzoin and steri-strips applied. Sites clean and dry.   Ara Kussmaul BSN, RN

## 2017-07-02 NOTE — Progress Notes (Signed)
CARDIAC REHAB PHASE I   PRE:  Rate/Rhythm: 103 ST with many PACs    BP: sitting 129/70    SaO2:   MODE:  Ambulation: 380 ft   POST:  Rate/Rhythm: 148 ST with many PACs and NSVT at times    BP: sitting 150/62     SaO2:   Pt feeling well. HR ST with many PACs (I see numerous P waves). Higher than it was this am. Pt HR increased with walking independently, up to 130-140s with several runs of NSVT, longest 7 bts. I still see p waves at times. Pt sts he can tell his HR is fast and SOB. To recliner. HR decreased to 100s with rest, no PVCs. Gave Off the Beat book. No other questions. Bennett, ACSM 07/02/2017 9:14 AM

## 2017-07-02 NOTE — Progress Notes (Addendum)
      [address]       [place] [address]             [phone]      5 Days Post-Op Procedure(s) (LRB): CORONARY ARTERY BYPASS GRAFTING (CABG) x 2  WITH ENDOSCOPIC HARVESTING OF RIGHT SAPHENOUS VEIN (N/A) TRANSESOPHAGEAL ECHOCARDIOGRAM (TEE) (N/A)   Subjective:  No new complaints.  Can tell he is in A. Fib.  + ambulation  + BM  Objective: Vital signs in last 24 hours: Temp:  [97.6 F (36.4 C)-98.5 F (36.9 C)] 97.6 F (36.4 C) (05/25 0310) Pulse Rate:  [82-87] 87 (05/25 0310) Cardiac Rhythm: Atrial fibrillation (05/25 0355) Resp:  [13-21] 13 (05/25 0310) BP: (104-130)/(60-68) 123/68 (05/25 0310) SpO2:  [96 %-100 %] 98 % (05/25 0310) Weight:  [192 lb 8 oz (87.3 kg)] 192 lb 8 oz (87.3 kg) (05/25 0310)  Intake/Output from previous day: 05/24 0701 - 05/25 0700 In: 960 [P.O.:960] Out: 850 [Urine:850]  General appearance: alert, cooperative and no distress Heart: irregularly irregular rhythm Lungs: clear to auscultation bilaterally Abdomen: soft, non-tender; bowel sounds normal; no masses,  no organomegaly Extremities: edema trace Wound: clean and dry  Lab Results: Recent Labs    06/30/17 0312 07/01/17 0407  WBC 16.0* 13.5*  HGB 9.1* 9.2*  HCT 27.0* 27.3*  PLT 145* 201   BMET:  Recent Labs    06/30/17 0312 07/01/17 0407  NA 133* 138  K 3.6 4.1  CL 97* 101  CO2 26 30  GLUCOSE 124* 108*  BUN 15 16  CREATININE 1.18 1.34*  CALCIUM 8.2* 8.4*    PT/INR: No results for input(s): LABPROT, INR in the last 72 hours. ABG    Component Value Date/Time   PHART 7.350 06/27/2017 1940   HCO3 22.0 06/27/2017 1940   TCO2 24 06/29/2017 1710   ACIDBASEDEF 3.0 (H) 06/27/2017 1940   O2SAT 99.0 06/27/2017 1940   CBG (last 3)  Recent Labs    06/30/17 1736 06/30/17 2118 07/01/17 0612  GLUCAP 111* 124* 96    Assessment/Plan: S/P Procedure(s) (LRB): CORONARY ARTERY BYPASS GRAFTING (CABG) x 2  WITH ENDOSCOPIC HARVESTING OF RIGHT SAPHENOUS VEIN  (N/A) TRANSESOPHAGEAL ECHOCARDIOGRAM (TEE) (N/A)  1.CV- PAF, currently rate controlled- continue Amiodarone, Lopressor- patient will need anticoagulation will start Xarelto 2. Pulm- no acute issues, continue IS 3. Renal- creatinine has been WNL, weight is trending down continue lasix 4. Expected post operative blood loss anemia, mild 5. Dispo- patient in rate controlled A. Fib this morning, will start Xarelto, continue Amiodarone, Lopressor, will d/c home today if okay with Dr. Cyndia Bent   Addendum: patient worked with cardiac rehab.  Was in NSR with runs of NSVT into the 130s-140s- will increase his Lopressor to 37.5 mg BID as his BP is borderline for increased BB.  Will hold d/c for today, observe rhythm and possibly d/c in AM    LOS: 5 days    Ellwood Handler 07/02/2017   Chart reviewed, patient examined, agree with above. He is in atrial fib in the 90's to 100 at rest now. Continue amio and Lopressor. Xarelto started. He may be able to go home tomorrow if HR does not get up too high with ambulation.

## 2017-07-03 LAB — CBC
HCT: 31 % — ABNORMAL LOW (ref 39.0–52.0)
HEMOGLOBIN: 10 g/dL — AB (ref 13.0–17.0)
MCH: 28.7 pg (ref 26.0–34.0)
MCHC: 32.3 g/dL (ref 30.0–36.0)
MCV: 88.8 fL (ref 78.0–100.0)
Platelets: 349 10*3/uL (ref 150–400)
RBC: 3.49 MIL/uL — AB (ref 4.22–5.81)
RDW: 14.7 % (ref 11.5–15.5)
WBC: 13.5 10*3/uL — AB (ref 4.0–10.5)

## 2017-07-03 LAB — BASIC METABOLIC PANEL
ANION GAP: 11 (ref 5–15)
BUN: 18 mg/dL (ref 6–20)
CHLORIDE: 100 mmol/L — AB (ref 101–111)
CO2: 26 mmol/L (ref 22–32)
Calcium: 8.6 mg/dL — ABNORMAL LOW (ref 8.9–10.3)
Creatinine, Ser: 1.53 mg/dL — ABNORMAL HIGH (ref 0.61–1.24)
GFR calc Af Amer: 50 mL/min — ABNORMAL LOW (ref >60)
GFR calc non Af Amer: 43 mL/min — ABNORMAL LOW (ref >60)
Glucose, Bld: 109 mg/dL — ABNORMAL HIGH (ref 65–99)
POTASSIUM: 3.6 mmol/L (ref 3.5–5.1)
SODIUM: 137 mmol/L (ref 135–145)

## 2017-07-03 LAB — TSH: TSH: 3.384 u[IU]/mL (ref 0.350–4.500)

## 2017-07-03 LAB — MAGNESIUM: Magnesium: 2.1 mg/dL (ref 1.7–2.4)

## 2017-07-03 MED ORDER — METOPROLOL TARTRATE 50 MG PO TABS: 50.0000 mg | ORAL_TABLET | Freq: Two times a day (BID) | ORAL | 3 refills | Status: DC

## 2017-07-03 MED ORDER — METOPROLOL TARTRATE 50 MG PO TABS
50.0000 mg | ORAL_TABLET | Freq: Two times a day (BID) | ORAL | Status: DC
  Administered 2017-07-03 – 2017-07-04 (×3): 50 mg via ORAL
  Filled 2017-07-03 (×3): qty 1

## 2017-07-03 MED ORDER — POTASSIUM CHLORIDE CRYS ER 20 MEQ PO TBCR
40.0000 meq | EXTENDED_RELEASE_TABLET | Freq: Once | ORAL | Status: AC
  Administered 2017-07-03: 40 meq via ORAL
  Filled 2017-07-03: qty 2

## 2017-07-03 NOTE — Discharge Instructions (Signed)
Information on my medicine - XARELTO (Rivaroxaban)  This medication education was reviewed with me or my healthcare representative as part of my discharge preparation.    Why was Xarelto prescribed for you? Xarelto was prescribed for you to reduce the risk of a blood clot forming that can cause a stroke if you have a medical condition called atrial fibrillation (a type of irregular heartbeat).  What do you need to know about xarelto ? Take your Xarelto ONCE DAILY at the same time every day with your evening meal. If you have difficulty swallowing the tablet whole, you may crush it and mix in applesauce just prior to taking your dose.  Take Xarelto exactly as prescribed by your doctor and DO NOT stop taking Xarelto without talking to the doctor who prescribed the medication.  Stopping without other stroke prevention medication to take the place of Xarelto may increase your risk of developing a clot that causes a stroke.  Refill your prescription before you run out.  After discharge, you should have regular check-up appointments with your healthcare provider that is prescribing your Xarelto.  In the future your dose may need to be changed if your kidney function or weight changes by a significant amount.  What do you do if you miss a dose? If you are taking Xarelto ONCE DAILY and you miss a dose, take it as soon as you remember on the same day then continue your regularly scheduled once daily regimen the next day. Do not take two doses of Xarelto at the same time or on the same day.   Important Safety Information A possible side effect of Xarelto is bleeding. You should call your healthcare provider right away if you experience any of the following: ? Bleeding from an injury or your nose that does not stop. ? Unusual colored urine (red or dark brown) or unusual colored stools (red or black). ? Unusual bruising for unknown reasons. ? A serious fall or if you hit your head (even if  there is no bleeding).  Some medicines may interact with Xarelto and might increase your risk of bleeding while on Xarelto. To help avoid this, consult your healthcare provider or pharmacist prior to using any new prescription or non-prescription medications, including herbals, vitamins, non-steroidal anti-inflammatory drugs (NSAIDs) and supplements.  This website has more information on Xarelto: [email or web address].[email or web address]    Prediabetes Eating Plan Prediabetes--also called impaired glucose tolerance or impaired fasting glucose--is a condition that causes blood sugar (blood glucose) levels to be higher than normal. Following a healthy diet can help to keep prediabetes under control. It can also help to lower the risk of type 2 diabetes and heart disease, which are increased in people who have prediabetes. Along with regular exercise, a healthy diet:  Promotes weight loss.  Helps to control blood sugar levels.  Helps to improve the way that the body uses insulin.  What do I need to know about this eating plan?  Use the glycemic index (GI) to plan your meals. The index tells you how quickly a food will raise your blood sugar. Choose low-GI foods. These foods take a longer time to raise blood sugar.  Pay close attention to the amount of carbohydrates in the food that you eat. Carbohydrates increase blood sugar levels.  Keep track of how many calories you take in. Eating the right amount of calories will help you to achieve a healthy weight. Losing about 7 percent of your starting weight can help to prevent type 2  diabetes.  You may want to follow a Mediterranean diet. This diet includes a lot of vegetables, lean meats or fish, whole grains, fruits, and healthy oils and fats. What foods can I eat? Grains Whole grains, such as whole-wheat or whole-grain breads, crackers, cereals, and pasta. Unsweetened oatmeal. Bulgur. Barley. Quinoa. Brown rice. Corn or whole-wheat flour tortillas or taco  shells. Vegetables Lettuce. Spinach. Peas. Beets. Cauliflower. Cabbage. Broccoli. Carrots. Tomatoes. Squash. Eggplant. Herbs. Peppers. Onions. Cucumbers. Brussels sprouts. Fruits Berries. Bananas. Apples. Oranges. Grapes. Papaya. Mango. Pomegranate. Kiwi. Grapefruit. Cherries. Meats and Other Protein Sources Seafood. Lean meats, such as chicken and [place] or lean cuts of pork and beef. Tofu. Eggs. Nuts. Beans. Dairy Low-fat or fat-free dairy products, such as yogurt, cottage cheese, and cheese. Beverages Water. Tea. Coffee. Sugar-free or diet soda. Seltzer water. Milk. Milk alternatives, such as soy or almond milk. Condiments Mustard. Relish. Low-fat, low-sugar ketchup. Low-fat, low-sugar barbecue sauce. Low-fat or fat-free mayonnaise. Sweets and Desserts Sugar-free or low-fat pudding. Sugar-free or low-fat ice cream and other frozen treats. Fats and Oils Avocado. Walnuts. Olive oil. The items listed above may not be a complete list of recommended foods or beverages. Contact your dietitian for more options. What foods are not recommended? Grains Refined white flour and flour products, such as bread, pasta, snack foods, and cereals. Beverages Sweetened drinks, such as sweet iced tea and soda. Sweets and Desserts Baked goods, such as cake, cupcakes, pastries, cookies, and cheesecake. The items listed above may not be a complete list of foods and beverages to avoid. Contact your dietitian for more information. This information is not intended to replace advice given to you by your health care provider. Make sure you discuss any questions you have with your health care provider. Document Released: 06/11/2014 Document Revised: 07/03/2015 Document Reviewed: 02/20/2014 Elsevier Interactive Patient Education  2017 Elsevier Inc.  Discharge Instructions:  1. You may shower, please wash incisions daily with soap and water and keep dry.  If you wish to cover wounds with dressing you may do so  but please keep clean and change daily.  No tub baths or swimming until incisions have completely healed.  If your incisions become red or develop any drainage please call our office at [phone]  2. No Driving until cleared by Dr. Lucianne Lei Trigt's office and you are no longer using narcotic pain medications  3. Monitor your weight daily.. Please use the same scale and weigh at same time... If you gain 5-10 lbs in 48 hours with associated lower extremity swelling, please contact our office at [phone]  4. Fever of 101.5 for at least 24 hours with no source, please contact our office at [phone]  5. Activity- up as tolerated, please walk at least 3 times per day.  Avoid strenuous activity, no lifting, pushing, or pulling with your arms over 8-10 lbs for a minimum of 6 weeks  6. If any questions or concerns arise, please do not hesitate to contact our office at [phone]

## 2017-07-03 NOTE — Progress Notes (Addendum)
      [address]       [place] [address]             [phone]      6 Days Post-Op Procedure(s) (LRB): CORONARY ARTERY BYPASS GRAFTING (CABG) x 2  WITH ENDOSCOPIC HARVESTING OF RIGHT SAPHENOUS VEIN (N/A) TRANSESOPHAGEAL ECHOCARDIOGRAM (TEE) (N/A)   Subjective:  No new complaints.  States feels well.  Wants to go home.  + ambulation  + BM  Objective: Vital signs in last 24 hours: Temp:  [98.2 F (36.8 C)-98.4 F (36.9 C)] 98.2 F (36.8 C) (05/26 0500) Pulse Rate:  [85-111] 85 (05/25 1900) Cardiac Rhythm: Normal sinus rhythm (05/25 1900) Resp:  [12-19] 13 (05/26 0500) BP: (100-150)/(59-65) 135/65 (05/26 0500) Weight:  [191 lb 11.2 oz (87 kg)] 191 lb 11.2 oz (87 kg) (05/26 0500)  Intake/Output from previous day: 05/25 0701 - 05/26 0700 In: 480 [P.O.:480] Out: -   General appearance: alert, cooperative and no distress Heart: irregularly irregular rhythm Lungs: clear to auscultation bilaterally Abdomen: soft, non-tender; bowel sounds normal; no masses,  no organomegaly Extremities: edema none Wound: clean and dry  Lab Results: Recent Labs    07/01/17 0407  WBC 13.5*  HGB 9.2*  HCT 27.3*  PLT 201   BMET:  Recent Labs    07/01/17 0407  NA 138  K 4.1  CL 101  CO2 30  GLUCOSE 108*  BUN 16  CREATININE 1.34*  CALCIUM 8.4*    PT/INR: No results for input(s): LABPROT, INR in the last 72 hours. ABG    Component Value Date/Time   PHART 7.350 06/27/2017 1940   HCO3 22.0 06/27/2017 1940   TCO2 24 06/29/2017 1710   ACIDBASEDEF 3.0 (H) 06/27/2017 1940   O2SAT 99.0 06/27/2017 1940   CBG (last 3)  Recent Labs    06/30/17 1736 06/30/17 2118 07/01/17 0612  GLUCAP 111* 124* 96    Assessment/Plan: S/P Procedure(s) (LRB): CORONARY ARTERY BYPASS GRAFTING (CABG) x 2  WITH ENDOSCOPIC HARVESTING OF RIGHT SAPHENOUS VEIN (N/A) TRANSESOPHAGEAL ECHOCARDIOGRAM (TEE) (N/A)  1. CV- continues to have episodic PAF- will increase Lopressor to 50 mg  BID, continue Amiodarone, Xarelto 2. Pulm- no acute issues, continue IS 3. Renal- weight is below baseline, no edema in LE.... Will stop Lasix, Potassium  4. Dispo- patient stable, increase Lopressor, check labs, will have nursing walk patient, monitor HR if remains in acceptable range will d/c home today   Addendum: patient ambulated with nursing with HR reaching into the 150s.  Labs show creatinine is elevated up to 1.53, likely due to dehydration with use of diuretics..Lasix and potassium have been stopped.  Mg and TSH level are okay   LOS: 6 days    Ellwood Handler 07/03/2017   Chart reviewed, patient examined, agree with above. He is back in sinus 80's this afternoon. Will continue amio and Lopressor increased to 50 bid. If rhythm stays stable today will send home tomorrow.

## 2017-07-04 LAB — BASIC METABOLIC PANEL
Anion gap: 7 (ref 5–15)
BUN: 19 mg/dL (ref 6–20)
CO2: 24 mmol/L (ref 22–32)
Calcium: 8.6 mg/dL — ABNORMAL LOW (ref 8.9–10.3)
Chloride: 107 mmol/L (ref 101–111)
Creatinine, Ser: 1.29 mg/dL — ABNORMAL HIGH (ref 0.61–1.24)
GFR calc Af Amer: >60 mL/min (ref >60)
GFR, EST NON AFRICAN AMERICAN: 53 mL/min — AB (ref >60)
GLUCOSE: 114 mg/dL — AB (ref 65–99)
POTASSIUM: 4.6 mmol/L (ref 3.5–5.1)
SODIUM: 138 mmol/L (ref 135–145)

## 2017-07-04 NOTE — Care Management Important Message (Signed)
Important Message  Patient Details  Name: Chase Guerrero MRN: [id number] Date of Birth: 13-Nov-1945   Medicare Important Message Given:  Yes    Delorse Lek 07/04/2017, 12:46 PM

## 2017-07-04 NOTE — Plan of Care (Signed)
  Problem: Education: Goal: Knowledge of General Education information will improve Outcome: Adequate for Discharge   Problem: Health Behavior/Discharge Planning: Goal: Ability to manage health-related needs will improve Outcome: Adequate for Discharge   Problem: Clinical Measurements: Goal: Ability to maintain clinical measurements within normal limits will improve Outcome: Adequate for Discharge Goal: Will remain free from infection Outcome: Adequate for Discharge Goal: Diagnostic test results will improve Outcome: Adequate for Discharge Goal: Respiratory complications will improve Outcome: Adequate for Discharge Goal: Cardiovascular complication will be avoided Outcome: Adequate for Discharge   Problem: Activity: Goal: Risk for activity intolerance will decrease Outcome: Adequate for Discharge   Problem: Nutrition: Goal: Adequate nutrition will be maintained Outcome: Adequate for Discharge   Problem: Coping: Goal: Level of anxiety will decrease Outcome: Adequate for Discharge   Problem: Elimination: Goal: Will not experience complications related to bowel motility Outcome: Adequate for Discharge Goal: Will not experience complications related to urinary retention Outcome: Adequate for Discharge   Problem: Pain Managment: Goal: General experience of comfort will improve Outcome: Adequate for Discharge   Problem: Safety: Goal: Ability to remain free from injury will improve Outcome: Adequate for Discharge   Problem: Skin Integrity: Goal: Risk for impaired skin integrity will decrease Outcome: Adequate for Discharge   Problem: Activity: Goal: Risk for activity intolerance will decrease Outcome: Adequate for Discharge   Problem: Cardiac: Goal: Hemodynamic stability will improve Outcome: Adequate for Discharge   Problem: Clinical Measurements: Goal: Postoperative complications will be avoided or minimized Outcome: Adequate for Discharge   Problem:  Respiratory: Goal: Respiratory status will improve Outcome: Adequate for Discharge   Problem: Skin Integrity: Goal: Wound healing without signs and symptoms of infection Outcome: Adequate for Discharge Goal: Risk for impaired skin integrity will decrease Outcome: Adequate for Discharge   Problem: Urinary Elimination: Goal: Ability to achieve and maintain adequate renal perfusion and functioning will improve Outcome: Adequate for Discharge

## 2017-07-04 NOTE — Progress Notes (Signed)
Discharge instructions reviewed with patient and family. Questions answered and prescriptions given. Patient left unit via wheelchair with nurse tech via wheelchair in stable condition with all instructions in hand.

## 2017-07-04 NOTE — Progress Notes (Addendum)
      [address]       [place] [address]             [phone]      7 Days Post-Op Procedure(s) (LRB): CORONARY ARTERY BYPASS GRAFTING (CABG) x 2  WITH ENDOSCOPIC HARVESTING OF RIGHT SAPHENOUS VEIN (N/A) TRANSESOPHAGEAL ECHOCARDIOGRAM (TEE) (N/A)   Subjective:  No new complaints.  He continues to feel good.  He is hoping to go home today.  + ambulation  + BM  Objective: Vital signs in last 24 hours: Temp:  [98.2 F (36.8 C)-98.7 F (37.1 C)] 98.6 F (37 C) (05/27 0405) Pulse Rate:  [76-120] 76 (05/27 0405) Cardiac Rhythm: Atrial fibrillation (05/27 0008) Resp:  [12-18] 12 (05/27 0405) BP: (111-134)/(58-78) 119/69 (05/27 0405) SpO2:  [93 %-98 %] 93 % (05/27 0405) Weight:  [191 lb 6.4 oz (86.8 kg)] 191 lb 6.4 oz (86.8 kg) (05/27 0405)  General appearance: alert, cooperative and no distress Heart: regular rate and rhythm Lungs: clear to auscultation bilaterally Abdomen: soft, non-tender; bowel sounds normal; no masses,  no organomegaly Extremities: edema none  Wound: clean and dry  Lab Results: Recent Labs    07/03/17 0849  WBC 13.5*  HGB 10.0*  HCT 31.0*  PLT 349   BMET:  Recent Labs    07/03/17 0849 07/04/17 0314  NA 137 138  K 3.6 4.6  CL 100* 107  CO2 26 24  GLUCOSE 109* 114*  BUN 18 19  CREATININE 1.53* 1.29*  CALCIUM 8.6* 8.6*    PT/INR: No results for input(s): LABPROT, INR in the last 72 hours. ABG    Component Value Date/Time   PHART 7.350 06/27/2017 1940   HCO3 22.0 06/27/2017 1940   TCO2 24 06/29/2017 1710   ACIDBASEDEF 3.0 (H) 06/27/2017 1940   O2SAT 99.0 06/27/2017 1940   CBG (last 3)  No results for input(s): GLUCAP in the last 72 hours.  Assessment/Plan: S/P Procedure(s) (LRB): CORONARY ARTERY BYPASS GRAFTING (CABG) x 2  WITH ENDOSCOPIC HARVESTING OF RIGHT SAPHENOUS VEIN (N/A) TRANSESOPHAGEAL ECHOCARDIOGRAM (TEE) (N/A)  1. CV- PAF, currently NSR- had brief episodes yesterday with controlled rates- will  continue Amiodarone, Lopressor, and Xarelto.. Patient given instruction that should he develop fast rate A. Fib that he remains in he would need to contact Cardiologist or come into the ED for evaluation 2. Pulm- no acute issues, continue IS 3. Renal- creatinine trending back down, at 1.29, no edema continue to hold Lasix, potassium 4. Dispo- patient stable, continues to have brief episodes of rate controlled A. Fib, in NSR currently, will remain on Xarelto, Amiodarone, Lopressor.  I think he is stable for discharge home today   LOS: 7 days    Ellwood Handler 07/04/2017  Agree with above. His rhythm is ok to go home.

## 2017-07-05 ENCOUNTER — Telehealth: Payer: Self-pay

## 2017-07-05 NOTE — Telephone Encounter (Signed)
Patient's wife called and stated that Chase Guerrero has a rash on his back.  She stated that the rash was something he has previously had and stated that she had some cream (Triamcinolone) in home that was prescribed before by dermatology.  She stated that the tube is expired.  She was just calling to make sure it was ok to give.  I advised her that it is ok to use the cream as long as it is on his back and no where near his surgical incisions.  I also stated that she should go ahead and contact the dermatologist that she originally got the prescription from and get a new one and advised to not use the expired cream.  She acknowledged receipt.

## 2017-07-06 ENCOUNTER — Telehealth: Payer: Self-pay | Admitting: *Deleted

## 2017-07-06 NOTE — Telephone Encounter (Signed)
Mrs. Storey has called to ask about Potassium. It is on the discharge summary medication list, but she did not get a presrciption for it. I told her that I would consult with Dr. Prescott Gum and call her back. I noted that his discharge K was 4.6 and told her that I doubted he would need it. When Dr. Prescott Gum came to the office later in the day I discussed the issue with him. He personally called Mr. Helfman and told him he did NOT need any potassium at this time.

## 2017-07-13 ENCOUNTER — Encounter: Payer: Self-pay | Admitting: Cardiology

## 2017-07-13 ENCOUNTER — Ambulatory Visit: Payer: PPO | Admitting: Cardiology

## 2017-07-13 ENCOUNTER — Telehealth: Payer: Self-pay | Admitting: Cardiology

## 2017-07-13 VITALS — BP 101/64 | HR 60 | Ht 70.0 in | Wt 192.2 lb

## 2017-07-13 DIAGNOSIS — R0602 Shortness of breath: Secondary | ICD-10-CM | POA: Diagnosis not present

## 2017-07-13 DIAGNOSIS — Z951 Presence of aortocoronary bypass graft: Secondary | ICD-10-CM | POA: Diagnosis not present

## 2017-07-13 DIAGNOSIS — I4891 Unspecified atrial fibrillation: Secondary | ICD-10-CM | POA: Diagnosis not present

## 2017-07-13 MED ORDER — METOPROLOL TARTRATE 50 MG PO TABS: 50.0000 mg | ORAL_TABLET | Freq: Two times a day (BID) | ORAL | 6 refills | Status: DC

## 2017-07-13 MED ORDER — POTASSIUM CHLORIDE CRYS ER 20 MEQ PO TBCR: 20.0000 meq | EXTENDED_RELEASE_TABLET | ORAL | 2 refills | Status: DC | PRN

## 2017-07-13 MED ORDER — AMIODARONE HCL 200 MG PO TABS: 200.0000 mg | ORAL_TABLET | Freq: Every day | ORAL | 3 refills | Status: DC

## 2017-07-13 MED ORDER — FUROSEMIDE 20 MG PO TABS: ORAL_TABLET | ORAL | 2 refills | Status: DC

## 2017-07-13 NOTE — Telephone Encounter (Signed)
Pre-cert Verification for the following procedure   Echo scheduled for 07/14/2017

## 2017-07-13 NOTE — Progress Notes (Signed)
Clinical Summary Chase Guerrero is a 72 y.o.male seen today for follow up of the following medical problems.   1. CAD - seen in 05/2017 for chest pain - 05/2017 high risk nuclear stress test as reported below, referred for cath - 06/2017 cath distal LM to LAD 100%, LVEF 35-45% by LV gram, referred for CABG - 06/27/17 CABG with LIMA-LAD, SVG-diag - no TTE on file. Normal valve function by intraop TEE  - some recent SOB at thimes. No edema, no LE edema. Mild orthopnea, sleeps in recliner.   2. Postop afib - recurrent issues with postop afib - treated with IV amio, converted to oral. Was to take 400mg  bid x 7 days, then go to 200mg  x 2 weeks.  - started on xarelto      Past Medical History:  Diagnosis Date  . Cancer ([facility])    skin  . Coronary artery disease   . Enlarged prostate   . GERD (gastroesophageal reflux disease)   . Hypertension   . Myocardial infarction ([facility])    mild  . Sleep apnea    not wearing  cpap  not worn in 9-10 yrs     No Known Allergies   Current Outpatient Medications  Medication Sig Dispense Refill  . acetaminophen (TYLENOL) 500 MG tablet Take 2 tablets (1,000 mg total) by mouth every 6 (six) hours as needed for mild pain or fever. 30 tablet 0  . amiodarone (PACERONE) 200 MG tablet Take 2 tablets (400 mg total) by mouth 2 (two) times daily. X 7 days then decrease to 200 mg 2 times daily 90 tablet 1  . aspirin EC 81 MG tablet Take 81 mg by mouth daily.    Marland Kitchen atorvastatin (LIPITOR) 80 MG tablet Take 1 tablet (80 mg total) by mouth daily. 30 tablet 11  . cetirizine (ZYRTEC) 10 MG tablet Take 10 mg by mouth daily as needed for allergies.    . finasteride (PROSCAR) 5 MG tablet Take 5 mg by mouth daily.    . fluticasone (FLONASE) 50 MCG/ACT nasal spray Place 1 spray into both nostrils daily as needed for allergies or rhinitis.    . Melatonin 3 MG TABS Take 3 mg by mouth daily as needed (sleep).    . Menthol, Topical Analgesic, (BIOFREEZE EX) Apply 1  application topically daily as needed (pain).    . metoprolol tartrate (LOPRESSOR) 50 MG tablet Take 1 tablet (50 mg total) by mouth 2 (two) times daily. 60 tablet 3  . nitroGLYCERIN (NITROSTAT) 0.4 MG SL tablet Place 0.4 mg under the tongue every 5 (five) minutes as needed for chest pain.     Marland Kitchen OVER THE COUNTER MEDICATION Apply 1 application topically daily as needed (pain). [place] Gel otc pain gel    . oxyCODONE (OXY IR/ROXICODONE) 5 MG immediate release tablet Take 1 tablet (5 mg total) by mouth every 4 (four) hours as needed for severe pain. 30 tablet 0  . potassium chloride SA (K-DUR,KLOR-CON) 20 MEQ tablet Take 1 tablet (20 mEq total) by mouth daily. 7 tablet 0  . ranitidine (ZANTAC) 150 MG tablet Take 150 mg by mouth daily as needed for heartburn.    . rivaroxaban (XARELTO) 20 MG TABS tablet Take 1 tablet (20 mg total) by mouth daily. 30 tablet 3  . Soft Lens Products (SENSITIVE EYES SALINE) SOLN Place 1 drop into both eyes 3 (three) times daily.     No current facility-administered medications for this visit.  Past Surgical History:  Procedure Laterality Date  . COLONOSCOPY N/A 07/23/2015   Procedure: COLONOSCOPY;  Surgeon: Daneil Dolin, MD;  [facility];  Service: Endoscopy;  Laterality: N/A;  10:30 Am  . CORONARY ARTERY BYPASS GRAFT N/A 06/27/2017   Procedure: CORONARY ARTERY BYPASS GRAFTING (CABG) x 2  WITH ENDOSCOPIC HARVESTING OF RIGHT SAPHENOUS VEIN;  Surgeon: Ivin Poot, MD;  Location: [facility];  Service: Open Heart Surgery;  Laterality: N/A;  . LEFT HEART CATH AND CORONARY ANGIOGRAPHY N/A 06/14/2017   Procedure: LEFT HEART CATH AND CORONARY ANGIOGRAPHY;  Surgeon: Leonie Man, MD;  Location: [facility];  Service: Cardiovascular;  Laterality: N/A;  . Right hand surgery    . Right knee arthroscopy    . TEE WITHOUT CARDIOVERSION N/A 06/27/2017   Procedure: TRANSESOPHAGEAL ECHOCARDIOGRAM (TEE);  Surgeon: Prescott Gum, Collier Salina, MD;  Location: [facility];  Service:  Open Heart Surgery;  Laterality: N/A;     No Known Allergies    Family History  Problem Relation Age of Onset  . Alzheimer's disease Mother   . Arthritis Sister   . Heart attack Brother   . Hypercholesterolemia Sister   . Liver disease Sister      Social History Chase Guerrero reports that he has quit smoking. His smoking use included cigarettes. He has a 10.00 pack-year smoking history. His smokeless tobacco use includes snuff. Chase Guerrero reports that he does not drink alcohol.   Review of Systems CONSTITUTIONAL: No weight loss, fever, chills, weakness or fatigue.  HEENT: Eyes: No visual loss, blurred vision, double vision or yellow sclerae.No hearing loss, sneezing, congestion, runny nose or sore throat.  SKIN: No rash or itching.  CARDIOVASCULAR: per hpi RESPIRATORY: per hpi GASTROINTESTINAL: No anorexia, nausea, vomiting or diarrhea. No abdominal pain or blood.  GENITOURINARY: No burning on urination, no polyuria NEUROLOGICAL: No headache, dizziness, syncope, paralysis, ataxia, numbness or tingling in the extremities. No change in bowel or bladder control.  MUSCULOSKELETAL: No muscle, back pain, joint pain or stiffness.  LYMPHATICS: No enlarged nodes. No history of splenectomy.  PSYCHIATRIC: No history of depression or anxiety.  ENDOCRINOLOGIC: No reports of sweating, cold or heat intolerance. No polyuria or polydipsia.  Marland Kitchen   Physical Examination Vitals:   07/13/17 1030  BP: 101/64  Pulse: 60  SpO2: 100%   Vitals:   07/13/17 1030  Weight: 192 lb 3.2 oz (87.2 kg)  Height: 5\' 10"  (1.778 m)    Gen: resting comfortably, no acute distress HEENT: no scleral icterus, pupils equal round and reactive, no palptable cervical adenopathy,  CV: RRR, no m/r/g, no jvd Resp: Clear to auscultation bilaterally GI: abdomen is soft, non-tender, non-distended, normal bowel sounds, no hepatosplenomegaly MSK: extremities are warm, no edema.  Skin: warm, no rash Neuro:  no focal  deficits Psych: appropriate affect   Diagnostic Studies  05/2017 nuclear stress  Blood pressure demonstrated a hypertensive response to exercise.  1 mm horizontal ST segment depressions in leads II, III, and aVF in recovery with nonspecific horizontal ST segment depressions in leads V5 and V6. There was excessive artifact with stress which limits interpretation.  Defect 1: There is a large defect of moderate severity present in the mid anterior, mid anteroseptal, mid inferoseptal, apical anterior, apical septal and apical inferior location.  Findings consistent with a large degree of ischemia.  This is a high risk study.  Nuclear stress EF: 50%.   Assessment and Plan  1. CAD - recent CABG - we  will obtain a formal TTE as he did not have one on file from his admission - continue current meds. Some recent SOB, will start lasix 20mg  x 3 days then prn, take KCl 66mEq on days he takes his lasix.   2. Postop afib - change amio to 200mg  bid x 2 weeks, then 200mg  daily.  - EKG today shows SR - continue amio and anticoag for now, likely d/c amio in near future. Plan for cardaic monitor to see if recurrent afib prior to deciding on stopping xarelto.    F/u 2 months     [name], M.D.

## 2017-07-13 NOTE — Patient Instructions (Signed)
Medication Instructions:   Decrease Amiodarone to 200mg  daily on July 25, 2017.  Change the Lasix to 20mg  daily x 3 days, then only as needed for swelling or edema thereafter.    Change the Potassium to only as needed on the days your take fluid medication.  Continue all other medications.    Labwork: none  Testing/Procedures:  Your physician has requested that you have an echocardiogram. Echocardiography is a painless test that uses sound waves to create images of your heart. It provides your doctor with information about the size and shape of your heart and how well your heart's chambers and valves are working. This procedure takes approximately one hour. There are no restrictions for this procedure.  Office will contact with results via phone or letter.    Follow-Up: 2 months   Any Other Special Instructions Will Be Listed Below (If Applicable).  If you need a refill on your cardiac medications before your next appointment, please call your pharmacy.

## 2017-07-14 ENCOUNTER — Other Ambulatory Visit: Payer: Self-pay

## 2017-07-14 ENCOUNTER — Ambulatory Visit (INDEPENDENT_AMBULATORY_CARE_PROVIDER_SITE_OTHER): Payer: PPO

## 2017-07-14 DIAGNOSIS — R0602 Shortness of breath: Secondary | ICD-10-CM | POA: Diagnosis not present

## 2017-07-18 ENCOUNTER — Encounter: Payer: Self-pay | Admitting: Cardiology

## 2017-07-21 ENCOUNTER — Telehealth: Payer: Self-pay | Admitting: *Deleted

## 2017-07-21 NOTE — Telephone Encounter (Signed)
-----   Message from [name], MD sent at 07/18/2017  3:48 PM EDT ----- Echo looks good, normal heart pumping function   Zandra Abts MD

## 2017-07-21 NOTE — Telephone Encounter (Signed)
Pt aware - routed to pcp  

## 2017-07-26 ENCOUNTER — Telehealth: Payer: Self-pay | Admitting: Cardiology

## 2017-07-26 NOTE — Telephone Encounter (Signed)
Calling with c/o nose bleed on left side - stated has had it dripping occasionally.  Stated that he did leave tissue there all day.  Stated has stopped now.  Been on Xarelto since end of May.  The last 3-4 days seemed to get progressively worse. In the meantime - did suggest that he can use Aftrin on cotton ball to pack nose to stop bleeding so the capillaries will constrict to stop the bleeding.  In some instances, may need to see ENT to have cauterized or evaluate for other issues.  Will fwd message to provider for further suggestions.

## 2017-07-26 NOTE — Telephone Encounter (Signed)
Nose bleed since Sunday- has to keep tissue on hand

## 2017-07-27 NOTE — Telephone Encounter (Signed)
Hold xarelto 3 days, if resolved bleeding restart. Please refer to ENT for epistaxis. Keep [place] updated.    Zandra Abts MD

## 2017-07-27 NOTE — Telephone Encounter (Signed)
Had one bleed today, but not like before.  Doing a lot better today.  Patient notified of below.  He will hold x 3 days, then resume.  If bleeding returns, he will let us know & we can do referral to ENT.  Verbalized understanding.

## 2017-08-01 ENCOUNTER — Other Ambulatory Visit: Payer: Self-pay | Admitting: Cardiothoracic Surgery

## 2017-08-01 DIAGNOSIS — Z951 Presence of aortocoronary bypass graft: Secondary | ICD-10-CM

## 2017-08-03 ENCOUNTER — Other Ambulatory Visit: Payer: Self-pay

## 2017-08-03 ENCOUNTER — Other Ambulatory Visit: Payer: Self-pay | Admitting: *Deleted

## 2017-08-03 ENCOUNTER — Ambulatory Visit
Admission: RE | Admit: 2017-08-03 | Discharge: 2017-08-03 | Disposition: A | Discharge status: Home / Self Care | Payer: PPO | Source: Ambulatory Visit | Attending: Cardiothoracic Surgery | Admitting: Cardiothoracic Surgery

## 2017-08-03 ENCOUNTER — Encounter: Payer: Self-pay | Admitting: Cardiothoracic Surgery

## 2017-08-03 ENCOUNTER — Ambulatory Visit (INDEPENDENT_AMBULATORY_CARE_PROVIDER_SITE_OTHER): Payer: Self-pay | Admitting: Cardiothoracic Surgery

## 2017-08-03 VITALS — BP 119/70 | HR 62 | Resp 16 | Ht 70.0 in | Wt 183.8 lb

## 2017-08-03 DIAGNOSIS — Z951 Presence of aortocoronary bypass graft: Secondary | ICD-10-CM

## 2017-08-03 DIAGNOSIS — J90 Pleural effusion, not elsewhere classified: Secondary | ICD-10-CM

## 2017-08-03 DIAGNOSIS — I2510 Atherosclerotic heart disease of native coronary artery without angina pectoris: Secondary | ICD-10-CM

## 2017-08-03 NOTE — Progress Notes (Signed)
PCP is [name], MD Referring Provider is Leonie Man, MD  Chief Complaint  Patient presents with  . Routine Post Op    f/u from surgery with CXR s/p CABG x 2.Marland KitchenMarland Kitchen5/20/19    HPI: Patient returns for follow-up 1 month after urgent multivessel CABG for non-STEMI.  Patient did well after surgery but had A. fib.  He was discharged home on amiodarone and Xarelto in sinus rhythm.  While at home he has had no recurrent angina.  Surgical incisions are healing well.  No peripheral edema.  He has had trouble with a nosebleed and was shortness of breath.  He has had trouble with poor appetite, poor nutrition, [probably related to amiodarone] and subsequent weight loss.  Chest x-ray today shows a new moderate left  pleural  effusion.  Sternal wires intact.  Cardiac silhouette stable.  Right lung clear.  Patient is in a sinus rhythm. Past Medical History:  Diagnosis Date  . Cancer ([place])    skin  . Coronary artery disease   . Enlarged prostate   . GERD (gastroesophageal reflux disease)   . Hypertension   . Myocardial infarction ([facility])    mild  . Sleep apnea    not wearing  cpap  not worn in 9-10 yrs    Past Surgical History:  Procedure Laterality Date  . COLONOSCOPY N/A 07/23/2015   Procedure: COLONOSCOPY;  Surgeon: Daneil Dolin, MD;  [facility];  Service: Endoscopy;  Laterality: N/A;  10:30 Am  . CORONARY ARTERY BYPASS GRAFT N/A 06/27/2017   Procedure: CORONARY ARTERY BYPASS GRAFTING (CABG) x 2  WITH ENDOSCOPIC HARVESTING OF RIGHT SAPHENOUS VEIN;  Surgeon: Ivin Poot, MD;  Location: [facility];  Service: Open Heart Surgery;  Laterality: N/A;  . LEFT HEART CATH AND CORONARY ANGIOGRAPHY N/A 06/14/2017   Procedure: LEFT HEART CATH AND CORONARY ANGIOGRAPHY;  Surgeon: Leonie Man, MD;  Location: [facility];  Service: Cardiovascular;  Laterality: N/A;  . Right hand surgery    . Right knee arthroscopy    . TEE WITHOUT CARDIOVERSION N/A 06/27/2017   Procedure:  TRANSESOPHAGEAL ECHOCARDIOGRAM (TEE);  Surgeon: Prescott Gum, Collier Salina, MD;  Location: [facility];  Service: Open Heart Surgery;  Laterality: N/A;    Family History  Problem Relation Age of Onset  . Alzheimer's disease Mother   . Arthritis Sister   . Heart attack Brother   . Hypercholesterolemia Sister   . Liver disease Sister     Social History Social History   Tobacco Use  . Smoking status: Former Smoker    Packs/day: 0.50    Years: 20.00    Pack years: 10.00    Types: Cigarettes  . Smokeless tobacco: Current User    Types: Snuff  . Tobacco comment: USING NICORETTE GUM TO TRY AND STOP USING SMOKELESS TOBACCO  Substance Use Topics  . Alcohol use: No  . Drug use: No    Current Outpatient Medications  Medication Sig Dispense Refill  . acetaminophen (TYLENOL) 500 MG tablet Take 2 tablets (1,000 mg total) by mouth every 6 (six) hours as needed for mild pain or fever. 30 tablet 0  . aspirin EC 81 MG tablet Take 81 mg by mouth daily.    Marland Kitchen atorvastatin (LIPITOR) 80 MG tablet Take 1 tablet (80 mg total) by mouth daily. 30 tablet 11  . cetirizine (ZYRTEC) 10 MG tablet Take 10 mg by mouth daily as needed for allergies.    . finasteride (PROSCAR) 5 MG  tablet Take 5 mg by mouth daily.    . fluticasone (FLONASE) 50 MCG/ACT nasal spray Place 1 spray into both nostrils daily as needed for allergies or rhinitis.    . furosemide (LASIX) 20 MG tablet Take one tab by mouth daily x 3 days, then only as needed for swelling or edema 30 tablet 2  . Melatonin 3 MG TABS Take 3 mg by mouth daily as needed (sleep).    . Menthol, Topical Analgesic, (BIOFREEZE EX) Apply 1 application topically daily as needed (pain).    . metoprolol tartrate (LOPRESSOR) 50 MG tablet Take 1 tablet (50 mg total) by mouth 2 (two) times daily. 60 tablet 6  . nitroGLYCERIN (NITROSTAT) 0.4 MG SL tablet Place 0.4 mg under the tongue every 5 (five) minutes as needed for chest pain.     Marland Kitchen OVER THE COUNTER MEDICATION Apply 1 application  topically daily as needed (pain). [place] Gel otc pain gel    . potassium chloride SA (K-DUR,KLOR-CON) 20 MEQ tablet Take 1 tablet (20 mEq total) by mouth as needed (only on the day you take the as needed Lasix). 30 tablet 2  . ranitidine (ZANTAC) 150 MG tablet Take 150 mg by mouth daily as needed for heartburn.    . Soft Lens Products (SENSITIVE EYES SALINE) SOLN Place 1 drop into both eyes 3 (three) times daily.     No current facility-administered medications for this visit.     No Known Allergies         BP 119/70 (BP Location: Right Arm, Patient Position: Sitting, Cuff Size: Large)   Pulse 62   Resp 16   Ht 5\' 10"  (1.778 m)   Wt 183 lb 12.8 oz (83.4 kg)   SpO2 98% Comment: RA  BMI 26.37 kg/m  Physical Exam       Exam    General- alert and comfortable    Neck- no JVD, no cervical adenopathy palpable, no carotid bruit   Lungs- clear without rales, wheezes   Cor- regular rate and rhythm, no murmur , gallop   Abdomen- soft, non-tender   Extremities - warm, non-tender, minimal edema   Neuro- oriented, appropriate, no focal weakness   Diagnostic Tests: Chest x-ray with moderate left pleural effusion, new from discharge  Impression: Doing well but with shortness of breath and nonproductive cough related left pleural effusion.  He is still taking Xarelto and took a dose today.  Patient is maintaining sinus rhythm and now can stop Xarelto and amiodarone.  I will schedule the patient for outpatient left thoracentesis at [facility] later this week to allow Xarelto washout.  I will refer the patient to outpatient cardiac rehab phase 2 at [place]: Patient can now drive and lift up to 15 pounds.  He will make the above-noted changes in medications.  I will see him back in 2 weeks with chest x-ray to make sure the left pleural effusion does not recur.  With better nutrition, and thoracentesis and stopping Xarelto the effusion should resolve.   Len Childs, MD [facility] [phone]

## 2017-08-04 DIAGNOSIS — R3121 Asymptomatic microscopic hematuria: Secondary | ICD-10-CM | POA: Diagnosis not present

## 2017-08-04 DIAGNOSIS — N401 Enlarged prostate with lower urinary tract symptoms: Secondary | ICD-10-CM | POA: Diagnosis not present

## 2017-08-08 ENCOUNTER — Encounter (HOSPITAL_COMMUNITY): Payer: Self-pay | Admitting: Student

## 2017-08-08 ENCOUNTER — Ambulatory Visit (HOSPITAL_COMMUNITY)
Admission: RE | Admit: 2017-08-08 | Discharge: 2017-08-08 | Disposition: A | Discharge status: Home / Self Care | Payer: PPO | Source: Ambulatory Visit | Attending: Cardiothoracic Surgery | Admitting: Cardiothoracic Surgery

## 2017-08-08 ENCOUNTER — Other Ambulatory Visit (HOSPITAL_COMMUNITY): Payer: Self-pay | Admitting: Student

## 2017-08-08 ENCOUNTER — Ambulatory Visit (HOSPITAL_COMMUNITY)
Admission: RE | Admit: 2017-08-08 | Discharge: 2017-08-08 | Disposition: A | Discharge status: Home / Self Care | Payer: PPO | Source: Ambulatory Visit | Attending: Student | Admitting: Student

## 2017-08-08 DIAGNOSIS — J90 Pleural effusion, not elsewhere classified: Secondary | ICD-10-CM | POA: Insufficient documentation

## 2017-08-08 DIAGNOSIS — Z951 Presence of aortocoronary bypass graft: Secondary | ICD-10-CM | POA: Diagnosis not present

## 2017-08-08 HISTORY — PX: IR THORACENTESIS RIGHT ASP PLEURAL SPACE W/IMG GUIDE: IMG5380

## 2017-08-08 MED ORDER — LIDOCAINE HCL (PF) 2 % IJ SOLN
INTRAMUSCULAR | Status: DC | PRN
  Administered 2017-08-08: 10 mL

## 2017-08-08 MED ORDER — LIDOCAINE HCL (PF) 2 % IJ SOLN
INTRAMUSCULAR | Status: AC
  Filled 2017-08-08: qty 20

## 2017-08-08 NOTE — Procedures (Signed)
PROCEDURE SUMMARY:  Successful image-guided left thoracentesis. Yielded 1.5 liters of blood tinged fluid. Patient tolerated procedure well. No immediate complications.  Specimen was not sent for labs. CXR ordered.  Alexandra Louk PA-C 08/08/2017 10:50 AM

## 2017-08-10 ENCOUNTER — Other Ambulatory Visit: Payer: Self-pay | Admitting: *Deleted

## 2017-08-10 MED ORDER — ATORVASTATIN CALCIUM 80 MG PO TABS: 80.0000 mg | ORAL_TABLET | Freq: Every day | ORAL | 3 refills | Status: DC

## 2017-08-10 MED ORDER — METOPROLOL TARTRATE 50 MG PO TABS: 50.0000 mg | ORAL_TABLET | Freq: Two times a day (BID) | ORAL | 3 refills | Status: DC

## 2017-08-10 MED ORDER — POTASSIUM CHLORIDE CRYS ER 20 MEQ PO TBCR: 20.0000 meq | EXTENDED_RELEASE_TABLET | Freq: Every day | ORAL | 0 refills | Status: DC | PRN

## 2017-08-10 MED ORDER — FUROSEMIDE 20 MG PO TABS: 20.0000 mg | ORAL_TABLET | Freq: Every day | ORAL | 0 refills | Status: DC | PRN

## 2017-08-12 ENCOUNTER — Telehealth: Payer: Self-pay

## 2017-08-12 ENCOUNTER — Other Ambulatory Visit: Payer: Self-pay | Admitting: *Deleted

## 2017-08-12 DIAGNOSIS — J90 Pleural effusion, not elsewhere classified: Secondary | ICD-10-CM

## 2017-08-12 NOTE — Telephone Encounter (Signed)
Patient called concerned about a worsening cough since his Thoracentesis this week.  He stated that he is not having any shortness of breath but coughs with normal breathing.  Per Dr. Prescott Gum patient needs to have PleurX catheter placed due to worsening fluid build up seen on xray after fluid was drained.  The office will be in contact with the patient to have the PleurX set up.

## 2017-08-15 ENCOUNTER — Encounter (HOSPITAL_COMMUNITY): Payer: Self-pay | Admitting: *Deleted

## 2017-08-15 ENCOUNTER — Other Ambulatory Visit: Payer: Self-pay

## 2017-08-15 NOTE — Progress Notes (Signed)
Spoke with pt for pre-op call. Pt has hx of CAD and had CABG done on 06/27/17. Pt has since developed a pleural effusion. Pt denies any recent chest pain. He states he did have Atrial fibrillation after his surgery, but states he's not had it since. Pt states he is not diabetic.

## 2017-08-16 ENCOUNTER — Encounter (HOSPITAL_COMMUNITY)
Admission: RE | Disposition: A | Discharge status: Home / Self Care | Payer: Self-pay | Source: Ambulatory Visit | Attending: Cardiothoracic Surgery

## 2017-08-16 ENCOUNTER — Ambulatory Visit (HOSPITAL_COMMUNITY): Payer: PPO

## 2017-08-16 ENCOUNTER — Encounter (HOSPITAL_COMMUNITY): Payer: Self-pay

## 2017-08-16 ENCOUNTER — Ambulatory Visit (HOSPITAL_COMMUNITY): Payer: PPO | Admitting: Anesthesiology

## 2017-08-16 ENCOUNTER — Ambulatory Visit (HOSPITAL_COMMUNITY)
Admission: RE | Admit: 2017-08-16 | Discharge: 2017-08-16 | Disposition: A | Discharge status: Home / Self Care | Payer: PPO | Source: Ambulatory Visit | Attending: Cardiothoracic Surgery | Admitting: Cardiothoracic Surgery

## 2017-08-16 ENCOUNTER — Other Ambulatory Visit: Payer: Self-pay

## 2017-08-16 DIAGNOSIS — Z85828 Personal history of other malignant neoplasm of skin: Secondary | ICD-10-CM | POA: Diagnosis not present

## 2017-08-16 DIAGNOSIS — I10 Essential (primary) hypertension: Secondary | ICD-10-CM | POA: Diagnosis not present

## 2017-08-16 DIAGNOSIS — I2510 Atherosclerotic heart disease of native coronary artery without angina pectoris: Secondary | ICD-10-CM | POA: Diagnosis not present

## 2017-08-16 DIAGNOSIS — J90 Pleural effusion, not elsewhere classified: Secondary | ICD-10-CM | POA: Insufficient documentation

## 2017-08-16 DIAGNOSIS — Z419 Encounter for procedure for purposes other than remedying health state, unspecified: Secondary | ICD-10-CM

## 2017-08-16 DIAGNOSIS — I252 Old myocardial infarction: Secondary | ICD-10-CM | POA: Diagnosis not present

## 2017-08-16 DIAGNOSIS — G4730 Sleep apnea, unspecified: Secondary | ICD-10-CM | POA: Insufficient documentation

## 2017-08-16 DIAGNOSIS — I25810 Atherosclerosis of coronary artery bypass graft(s) without angina pectoris: Secondary | ICD-10-CM | POA: Diagnosis not present

## 2017-08-16 DIAGNOSIS — Z951 Presence of aortocoronary bypass graft: Secondary | ICD-10-CM | POA: Diagnosis not present

## 2017-08-16 DIAGNOSIS — Z9889 Other specified postprocedural states: Secondary | ICD-10-CM

## 2017-08-16 DIAGNOSIS — Z87891 Personal history of nicotine dependence: Secondary | ICD-10-CM | POA: Insufficient documentation

## 2017-08-16 DIAGNOSIS — Z4682 Encounter for fitting and adjustment of non-vascular catheter: Secondary | ICD-10-CM | POA: Diagnosis not present

## 2017-08-16 DIAGNOSIS — R0602 Shortness of breath: Secondary | ICD-10-CM | POA: Diagnosis present

## 2017-08-16 DIAGNOSIS — G4733 Obstructive sleep apnea (adult) (pediatric): Secondary | ICD-10-CM | POA: Diagnosis not present

## 2017-08-16 HISTORY — PX: CHEST TUBE INSERTION: SHX231

## 2017-08-16 HISTORY — DX: Unspecified osteoarthritis, unspecified site: M19.90

## 2017-08-16 HISTORY — DX: Pleural effusion, not elsewhere classified: J90

## 2017-08-16 HISTORY — DX: Unspecified atrial fibrillation: I48.91

## 2017-08-16 LAB — COMPREHENSIVE METABOLIC PANEL
ALT: 14 U/L (ref 0–44)
AST: 16 U/L (ref 15–41)
Albumin: 3.2 g/dL — ABNORMAL LOW (ref 3.5–5.0)
Alkaline Phosphatase: 78 U/L (ref 38–126)
Anion gap: 12 (ref 5–15)
BUN: 15 mg/dL (ref 8–23)
CO2: 23 mmol/L (ref 22–32)
Calcium: 9.2 mg/dL (ref 8.9–10.3)
Chloride: 103 mmol/L (ref 98–111)
Creatinine, Ser: 1.6 mg/dL — ABNORMAL HIGH (ref 0.61–1.24)
GFR calc Af Amer: 48 mL/min — ABNORMAL LOW (ref >60)
GFR calc non Af Amer: 41 mL/min — ABNORMAL LOW (ref >60)
Glucose, Bld: 114 mg/dL — ABNORMAL HIGH (ref 70–99)
Potassium: 4.1 mmol/L (ref 3.5–5.1)
Sodium: 138 mmol/L (ref 135–145)
Total Bilirubin: 0.7 mg/dL (ref 0.3–1.2)
Total Protein: 6.4 g/dL — ABNORMAL LOW (ref 6.5–8.1)

## 2017-08-16 LAB — PROTIME-INR
INR: 1.07
Prothrombin Time: 13.8 seconds (ref 11.4–15.2)

## 2017-08-16 LAB — APTT: aPTT: 32 seconds (ref 24–36)

## 2017-08-16 LAB — CBC
HCT: 43.8 % (ref 39.0–52.0)
Hemoglobin: 12.9 g/dL — ABNORMAL LOW (ref 13.0–17.0)
MCH: 26.4 pg (ref 26.0–34.0)
MCHC: 29.5 g/dL — ABNORMAL LOW (ref 30.0–36.0)
MCV: 89.8 fL (ref 78.0–100.0)
Platelets: 348 10*3/uL (ref 150–400)
RBC: 4.88 MIL/uL (ref 4.22–5.81)
RDW: 14.9 % (ref 11.5–15.5)
WBC: 7.9 10*3/uL (ref 4.0–10.5)

## 2017-08-16 SURGERY — INSERTION, PLEURAL DRAINAGE CATHETER
Anesthesia: Monitor Anesthesia Care | Site: Chest | Laterality: Left

## 2017-08-16 MED ORDER — LIDOCAINE HCL 1 % IJ SOLN
INTRAMUSCULAR | Status: DC | PRN
  Administered 2017-08-16: 15 mL

## 2017-08-16 MED ORDER — 0.9 % SODIUM CHLORIDE (POUR BTL) OPTIME
TOPICAL | Status: DC | PRN
  Administered 2017-08-16: 1000 mL

## 2017-08-16 MED ORDER — PROPOFOL 10 MG/ML IV BOLUS
INTRAVENOUS | Status: DC | PRN
  Administered 2017-08-16: 20 mg via INTRAVENOUS

## 2017-08-16 MED ORDER — ACETAMINOPHEN 650 MG RE SUPP: 650.0000 mg | RECTAL | Status: DC | PRN

## 2017-08-16 MED ORDER — DEXAMETHASONE SODIUM PHOSPHATE 10 MG/ML IJ SOLN
INTRAMUSCULAR | Status: DC | PRN
  Administered 2017-08-16: 8 mg via INTRAVENOUS

## 2017-08-16 MED ORDER — EPHEDRINE SULFATE 50 MG/ML IJ SOLN
INTRAMUSCULAR | Status: AC
  Filled 2017-08-16: qty 1

## 2017-08-16 MED ORDER — DEXAMETHASONE SODIUM PHOSPHATE 10 MG/ML IJ SOLN
INTRAMUSCULAR | Status: AC
  Filled 2017-08-16: qty 1

## 2017-08-16 MED ORDER — ONDANSETRON HCL 4 MG/2ML IJ SOLN
INTRAMUSCULAR | Status: AC
  Filled 2017-08-16: qty 2

## 2017-08-16 MED ORDER — OXYCODONE HCL 5 MG PO TABS: 5.0000 mg | ORAL_TABLET | Freq: Once | ORAL | Status: DC | PRN

## 2017-08-16 MED ORDER — SODIUM CHLORIDE 0.9% FLUSH: 3.0000 mL | INTRAVENOUS | Status: DC | PRN

## 2017-08-16 MED ORDER — MEPERIDINE HCL 50 MG/ML IJ SOLN: 6.2500 mg | INTRAMUSCULAR | Status: DC | PRN

## 2017-08-16 MED ORDER — OXYCODONE HCL 5 MG PO TABS: 5.0000 mg | ORAL_TABLET | ORAL | Status: DC | PRN

## 2017-08-16 MED ORDER — LACTATED RINGERS IV SOLN
INTRAVENOUS | Status: DC
  Administered 2017-08-16: 12:00:00 via INTRAVENOUS

## 2017-08-16 MED ORDER — ACETAMINOPHEN 500 MG PO TABS: 1000.0000 mg | ORAL_TABLET | Freq: Four times a day (QID) | ORAL | Status: DC

## 2017-08-16 MED ORDER — PROMETHAZINE HCL 25 MG/ML IJ SOLN: 6.2500 mg | INTRAMUSCULAR | Status: DC | PRN

## 2017-08-16 MED ORDER — ONDANSETRON HCL 4 MG/2ML IJ SOLN
INTRAMUSCULAR | Status: DC | PRN
  Administered 2017-08-16: 4 mg via INTRAVENOUS

## 2017-08-16 MED ORDER — PHENYLEPHRINE 40 MCG/ML (10ML) SYRINGE FOR IV PUSH (FOR BLOOD PRESSURE SUPPORT)
PREFILLED_SYRINGE | INTRAVENOUS | Status: DC | PRN
  Administered 2017-08-16: 120 ug via INTRAVENOUS
  Administered 2017-08-16 (×2): 80 ug via INTRAVENOUS

## 2017-08-16 MED ORDER — OXYCODONE HCL 5 MG/5ML PO SOLN: 5.0000 mg | Freq: Once | ORAL | Status: DC | PRN

## 2017-08-16 MED ORDER — CEFAZOLIN SODIUM-DEXTROSE 2-4 GM/100ML-% IV SOLN
2.0000 g | INTRAVENOUS | Status: AC
  Administered 2017-08-16: 2 g via INTRAVENOUS
  Filled 2017-08-16: qty 100

## 2017-08-16 MED ORDER — PROPOFOL 500 MG/50ML IV EMUL
INTRAVENOUS | Status: DC | PRN
  Administered 2017-08-16: 50 ug/kg/min via INTRAVENOUS

## 2017-08-16 MED ORDER — FENTANYL CITRATE (PF) 100 MCG/2ML IJ SOLN: 25.0000 ug | INTRAMUSCULAR | Status: DC | PRN

## 2017-08-16 MED ORDER — SODIUM CHLORIDE 0.9% FLUSH: 3.0000 mL | Freq: Two times a day (BID) | INTRAVENOUS | Status: DC

## 2017-08-16 MED ORDER — LIDOCAINE 2% (20 MG/ML) 5 ML SYRINGE
INTRAMUSCULAR | Status: AC
  Filled 2017-08-16: qty 5

## 2017-08-16 MED ORDER — LIDOCAINE 2% (20 MG/ML) 5 ML SYRINGE
INTRAMUSCULAR | Status: DC | PRN
  Administered 2017-08-16: 50 mg via INTRAVENOUS

## 2017-08-16 MED ORDER — FENTANYL CITRATE (PF) 250 MCG/5ML IJ SOLN
INTRAMUSCULAR | Status: DC | PRN
  Administered 2017-08-16 (×2): 50 ug via INTRAVENOUS

## 2017-08-16 MED ORDER — SODIUM CHLORIDE 0.9 % IV SOLN: 250.0000 mL | INTRAVENOUS | Status: DC | PRN

## 2017-08-16 MED ORDER — ACETAMINOPHEN 325 MG PO TABS: 650.0000 mg | ORAL_TABLET | ORAL | Status: DC | PRN

## 2017-08-16 MED ORDER — LACTATED RINGERS IV SOLN
INTRAVENOUS | Status: DC | PRN
  Administered 2017-08-16: 12:00:00 via INTRAVENOUS

## 2017-08-16 MED ORDER — FENTANYL CITRATE (PF) 250 MCG/5ML IJ SOLN
INTRAMUSCULAR | Status: AC
  Filled 2017-08-16: qty 5

## 2017-08-16 MED ORDER — PHENYLEPHRINE 40 MCG/ML (10ML) SYRINGE FOR IV PUSH (FOR BLOOD PRESSURE SUPPORT)
PREFILLED_SYRINGE | INTRAVENOUS | Status: AC
  Filled 2017-08-16: qty 10

## 2017-08-16 SURGICAL SUPPLY — 28 items
ADH SKN CLS APL DERMABOND .7 (GAUZE/BANDAGES/DRESSINGS) ×1
CANISTER SUCT 3000ML PPV (MISCELLANEOUS) ×3 IMPLANT
COVER SURGICAL LIGHT HANDLE (MISCELLANEOUS) ×3 IMPLANT
DERMABOND ADVANCED (GAUZE/BANDAGES/DRESSINGS) ×2
DERMABOND ADVANCED .7 DNX12 (GAUZE/BANDAGES/DRESSINGS) ×1 IMPLANT
DRAPE C-ARM 42X72 X-RAY (DRAPES) ×3 IMPLANT
DRAPE LAPAROSCOPIC ABDOMINAL (DRAPES) ×3 IMPLANT
GLOVE BIO SURGEON STRL SZ7.5 (GLOVE) ×6 IMPLANT
GOWN STRL REUS W/ TWL LRG LVL3 (GOWN DISPOSABLE) ×2 IMPLANT
GOWN STRL REUS W/TWL LRG LVL3 (GOWN DISPOSABLE) ×6
KIT BASIN OR (CUSTOM PROCEDURE TRAY) ×3 IMPLANT
KIT PLEURX DRAIN CATH 1000ML (MISCELLANEOUS) ×7 IMPLANT
KIT PLEURX DRAIN CATH 15.5FR (DRAIN) ×3 IMPLANT
KIT TURNOVER KIT B (KITS) ×3 IMPLANT
NDL HYPO 25GX1X1/2 BEV (NEEDLE) ×1 IMPLANT
NEEDLE HYPO 25GX1X1/2 BEV (NEEDLE) ×3 IMPLANT
NS IRRIG 1000ML POUR BTL (IV SOLUTION) ×3 IMPLANT
PACK GENERAL/GYN (CUSTOM PROCEDURE TRAY) ×3 IMPLANT
PAD ARMBOARD 7.5X6 YLW CONV (MISCELLANEOUS) ×6 IMPLANT
SET DRAINAGE LINE (MISCELLANEOUS) IMPLANT
SUT ETHILON 3 0 PS 1 (SUTURE) ×3 IMPLANT
SUT SILK 2 0 SH (SUTURE) ×3 IMPLANT
SUT VIC AB 3-0 SH 8-18 (SUTURE) ×3 IMPLANT
SYR CONTROL 10ML LL (SYRINGE) ×3 IMPLANT
TOWEL GREEN STERILE (TOWEL DISPOSABLE) ×3 IMPLANT
TOWEL GREEN STERILE FF (TOWEL DISPOSABLE) ×3 IMPLANT
VALVE REPLACEMENT CAP (MISCELLANEOUS) IMPLANT
WATER STERILE IRR 1000ML POUR (IV SOLUTION) ×3 IMPLANT

## 2017-08-16 NOTE — Progress Notes (Signed)
Pre Procedure note for inpatients:   Chase Guerrero has been scheduled for Procedure(s): INSERTION PLEURAL DRAINAGE CATHETER (Left) today. The various methods of treatment have been discussed with the patient. After consideration of the risks, benefits and treatment options the patient has consented to the planned procedure.   The patient has been seen and labs reviewed. There are no changes in the patient's condition to prevent proceeding with the planned procedure today.  Recent labs:  Lab Results  Component Value Date   WBC 7.9 08/16/2017   HGB 12.9 (L) 08/16/2017   HCT 43.8 08/16/2017   PLT 348 08/16/2017   GLUCOSE 114 (H) 08/16/2017   ALT 14 08/16/2017   AST 16 08/16/2017   NA 138 08/16/2017   K 4.1 08/16/2017   CL 103 08/16/2017   CREATININE 1.60 (H) 08/16/2017   BUN 15 08/16/2017   CO2 23 08/16/2017   TSH 3.384 07/03/2017   INR 1.07 08/16/2017   HGBA1C 5.9 (H) 06/24/2017    Len Childs, MD 08/16/2017 4:25 PM

## 2017-08-16 NOTE — H&P (Signed)
PCP is [name], MD Referring Provider is No ref. provider found  Shortness of breath  HPI: Patient returns for follow-up 1 month after urgent multivessel CABG for non-STEMI.  Patient did well after surgery but had A. fib.  He was discharged home on amiodarone and Xarelto in sinus rhythm.  While at home he has had no recurrent angina.  Surgical incisions are healing well.  No peripheral edema.  He has had trouble with a nosebleed and was shortness of breath.  He has had trouble with poor appetite, poor nutrition, [probably related to amiodarone] and subsequent weight loss.  The patient was found to have a large L postop pleural effusion on his first postop visit. 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 Arthritis   . Atrial fibrillation ([place])   . Cancer ([place])    skin  . Coronary artery disease   . Enlarged prostate   . GERD (gastroesophageal reflux disease)   . Hypertension   . Myocardial infarction (H[facility])    mild  . Pleural effusion   . Sleep apnea    not wearing  cpap  not worn in 9-10 yrs    Past Surgical History:  Procedure Laterality Date  . COLONOSCOPY N/A 07/23/2015   Procedure: COLONOSCOPY;  Surgeon: Daneil Dolin, MD;  [facility];  Service: Endoscopy;  Laterality: N/A;  10:30 Am  . CORONARY ARTERY BYPASS GRAFT N/A 06/27/2017   Procedure: CORONARY ARTERY BYPASS GRAFTING (CABG) x 2  WITH ENDOSCOPIC HARVESTING OF RIGHT SAPHENOUS VEIN;  Surgeon: Ivin Poot, MD;  Location: [facility];  Service: Open Heart Surgery;  Laterality: N/A;  . IR THORACENTESIS ASP PLEURAL SPACE W/IMG GUIDE  08/08/2017  . LEFT HEART CATH AND CORONARY ANGIOGRAPHY N/A 06/14/2017   Procedure:  LEFT HEART CATH AND CORONARY ANGIOGRAPHY;  Surgeon: Leonie Man, MD;  Location: [place] [facility];  Service: Cardiovascular;  Laterality: N/A;  . Right hand surgery    . Right knee arthroscopy    . TEE WITHOUT CARDIOVERSION N/A 06/27/2017   Procedure: TRANSESOPHAGEAL ECHOCARDIOGRAM (TEE);  Surgeon: Prescott Gum, Collier Salina, MD;  Location: [facility];  Service: Open Heart Surgery;  Laterality: N/A;    Family History  Problem Relation Age of Onset  . Alzheimer's disease Mother   . Arthritis Sister   . Heart attack Brother   . Hypercholesterolemia Sister   . Liver disease Sister     Social History Social History   Tobacco Use  . Smoking status: Former Smoker    Packs/day: 0.50    Years: 20.00    Pack years: 10.00    Types: Cigarettes  . Smokeless tobacco: Former Systems developer    Types: Snuff  . Tobacco comment: USING NICORETTE GUM TO TRY AND STOP USING SMOKELESS TOBACCO  Substance Use Topics  . Alcohol use: No  . Drug use: No    Current Facility-Administered Medications  Medication Dose Route Frequency Provider Last Rate Last Dose  . ceFAZolin (ANCEF) IVPB 2g/100 mL premix  2 g Intravenous 30 min Pre-Op Prescott Gum, Collier Salina, MD      . lactated ringers infusion   Intravenous Continuous Catalina Gravel, MD 10 mL/hr at  08/16/17 1138     Facility-Administered Medications Ordered in Other Encounters  Medication Dose Route Frequency Provider Last Rate Last Dose  . lactated ringers infusion   Intravenous Continuous PRN Orlie Dakin, CRNA        No Known Allergies         BP (!) 148/64   Pulse 62   Temp 97.6 F (36.4 C) (Oral)   Resp 18   Ht 5\' 10"  (1.778 m)   Wt 183 lb (83 kg)   SpO2 97%   BMI 26.26 kg/m  Physical Exam       Exam    General- alert and comfortable    Neck- no JVD, no cervical adenopathy palpable, no carotid bruit   Lungs- clear but diminished breath sounds L base   Cor- regular rate and rhythm, no murmur , gallop   Abdomen- soft, non-tender   Extremities -  warm, non-tender, minimal edema   Neuro- oriented, appropriate, no focal weakness   Diagnostic Tests: Chest x-ray with moderate left pleural effusion, npersistent from last office visit from discharge  Impression: Doing well but with shortness of breath and nonproductive cough related left pleural effusion.  He is still taking Xarelto and took a dose today.  Patient is maintaining sinus rhythm and now can stop Xarelto and amiodarone.  The procedure of L pleurx catheter placement was discussed with patient and familyi ncluding benefits and risks Len Childs, MD [facility] [phone]

## 2017-08-16 NOTE — Anesthesia Preprocedure Evaluation (Signed)
Anesthesia Evaluation  Patient identified by MRN, date of birth, ID band Patient awake    Reviewed: Allergy & Precautions, NPO status , Patient's Chart, lab work & pertinent test results  History of Anesthesia Complications Negative for: history of anesthetic complications  Airway Mallampati: II  TM Distance: <3 FB Neck ROM: Full    Dental  (+) Teeth Intact   Pulmonary sleep apnea , former smoker,    breath sounds clear to auscultation       Cardiovascular hypertension, + angina + CAD and + Past MI   Rhythm:Regular Rate:Normal     Neuro/Psych    GI/Hepatic Neg liver ROS, GERD  ,  Endo/Other  negative endocrine ROS  Renal/GU negative Renal ROS     Musculoskeletal   Abdominal   Peds  Hematology negative hematology ROS (+)   Anesthesia Other Findings   Reproductive/Obstetrics                             Anesthesia Physical  Anesthesia Plan  ASA: III  Anesthesia Plan: MAC   Post-op Pain Management:    Induction: Intravenous  PONV Risk Score and Plan: 3 and Ondansetron, Treatment may vary due to age or medical condition, Propofol infusion and TIVA  Airway Management Planned: Simple Face Mask  Additional Equipment:   Intra-op Plan:   Post-operative Plan:   Informed Consent: I have reviewed the patients History and Physical, chart, labs and discussed the procedure including the risks, benefits and alternatives for the proposed anesthesia with the patient or authorized representative who has indicated his/her understanding and acceptance.   Dental advisory given  Plan Discussed with: CRNA  Anesthesia Plan Comments:         Anesthesia Quick Evaluation

## 2017-08-16 NOTE — Brief Op Note (Signed)
08/16/2017  5:26 PM  PATIENT:  Chase Guerrero  72 y.o. male  PRE-OPERATIVE DIAGNOSIS:  RECURRENT LEFT PLEURAL EFFUSION  POST-OPERATIVE DIAGNOSIS:  RECURRENT LEFT PLEURAL EFFUSION  PROCEDURE:  Procedure(s): INSERTION PLEURAL DRAINAGE CATHETER (Left) Drainage L pleural effusion 800 ml  SURGEON:  Surgeon(s) and Role:    Ivin Poot, MD - Primary  PHYSICIAN ASSISTANT:   ASSISTANTS: none   ANESTHESIA:   local, IV sedation and MAC  EBL:  10 cc  BLOOD ADMINISTERED:none  DRAINS: none   LOCAL MEDICATIONS USED:  Amount: 10 ml  SPECIMEN:  No Specimen  DISPOSITION OF SPECIMEN:  N/A  COUNTS:  YES  TOURNIQUET:  * No tourniquets in log *  DICTATION: .Dragon Dictation  PLAN OF CARE: Discharge to home after PACU  PATIENT DISPOSITION:  PACU stable condition   Delay start of Pharmacological VTE agent (>24hrs) due to surgical blood loss or risk of bleeding: yes

## 2017-08-16 NOTE — Anesthesia Postprocedure Evaluation (Signed)
Anesthesia Post Note  Patient: Chase Guerrero  Procedure(s) Performed: INSERTION PLEURAL DRAINAGE CATHETER (Left Chest)     Patient location during evaluation: PACU Anesthesia Type: MAC Level of consciousness: awake and alert Pain management: pain level controlled Vital Signs Assessment: post-procedure vital signs reviewed and stable Respiratory status: spontaneous breathing, nonlabored ventilation, respiratory function stable and patient connected to nasal cannula oxygen Cardiovascular status: stable and blood pressure returned to baseline Postop Assessment: no apparent nausea or vomiting Anesthetic complications: no    Last Vitals:  Vitals:   08/16/17 1815 08/16/17 1830  BP: 117/69   Pulse: 63   Resp: 18   Temp:  36.5 C  SpO2: 97%     Last Pain:  Vitals:   08/16/17 1830  TempSrc:   PainSc: 0-No pain                 Zamariah Seaborn COKER

## 2017-08-16 NOTE — Discharge Instructions (Signed)
Keep dressing dry 24 hrs then shower Office will call to setup followup appt Home RN will contact you about catheter drainage schedule May drive in 24 hrs Take tylenol for pain

## 2017-08-16 NOTE — Transfer of Care (Signed)
Immediate Anesthesia Transfer of Care Note  Patient: Chase Guerrero  Procedure(s) Performed: INSERTION PLEURAL DRAINAGE CATHETER (Left Chest)  Patient Location: PACU  Anesthesia Type:MAC  Level of Consciousness: awake, oriented and patient cooperative  Airway & Oxygen Therapy: Patient Spontanous Breathing and Patient connected to nasal cannula oxygen  Post-op Assessment: Report given to RN and Post -op Vital signs reviewed and stable  Post vital signs: Reviewed and stable  Last Vitals:  Vitals Value Taken Time  BP 111/71 08/16/2017  5:30 PM  Temp 36.5 C 08/16/2017  5:29 PM  Pulse 67 08/16/2017  5:32 PM  Resp 12 08/16/2017  5:32 PM  SpO2 97 % 08/16/2017  5:32 PM  Vitals shown include unvalidated device data.  Last Pain:  Vitals:   08/16/17 1729  TempSrc:   PainSc: 0-No pain      Patients Stated Pain Goal: 3 (34/19/37 9024)  Complications: No apparent anesthesia complications

## 2017-08-16 NOTE — Care Management (Signed)
ED CM spoke with the patient's wife and daughter. Home Health Order and face sheet faxed to [place]. Fax confirmation received.   Faxed PleurX order to [facility]. Fax confirmation received.  Venita Sheffield RN CCM

## 2017-08-17 ENCOUNTER — Encounter (HOSPITAL_COMMUNITY): Payer: Self-pay | Admitting: Cardiothoracic Surgery

## 2017-08-17 DIAGNOSIS — Z4803 Encounter for change or removal of drains: Secondary | ICD-10-CM | POA: Diagnosis not present

## 2017-08-17 DIAGNOSIS — Z87891 Personal history of nicotine dependence: Secondary | ICD-10-CM | POA: Diagnosis not present

## 2017-08-17 DIAGNOSIS — J9589 Other postprocedural complications and disorders of respiratory system, not elsewhere classified: Secondary | ICD-10-CM | POA: Diagnosis not present

## 2017-08-17 DIAGNOSIS — Z951 Presence of aortocoronary bypass graft: Secondary | ICD-10-CM | POA: Diagnosis not present

## 2017-08-17 DIAGNOSIS — Z85828 Personal history of other malignant neoplasm of skin: Secondary | ICD-10-CM | POA: Diagnosis not present

## 2017-08-17 DIAGNOSIS — Z7982 Long term (current) use of aspirin: Secondary | ICD-10-CM | POA: Diagnosis not present

## 2017-08-17 DIAGNOSIS — J90 Pleural effusion, not elsewhere classified: Secondary | ICD-10-CM | POA: Diagnosis not present

## 2017-08-17 DIAGNOSIS — N400 Enlarged prostate without lower urinary tract symptoms: Secondary | ICD-10-CM | POA: Diagnosis not present

## 2017-08-17 DIAGNOSIS — I2510 Atherosclerotic heart disease of native coronary artery without angina pectoris: Secondary | ICD-10-CM | POA: Diagnosis not present

## 2017-08-17 DIAGNOSIS — G4730 Sleep apnea, unspecified: Secondary | ICD-10-CM | POA: Diagnosis not present

## 2017-08-17 DIAGNOSIS — I4891 Unspecified atrial fibrillation: Secondary | ICD-10-CM | POA: Diagnosis not present

## 2017-08-17 DIAGNOSIS — I10 Essential (primary) hypertension: Secondary | ICD-10-CM | POA: Diagnosis not present

## 2017-08-17 DIAGNOSIS — M1991 Primary osteoarthritis, unspecified site: Secondary | ICD-10-CM | POA: Diagnosis not present

## 2017-08-17 DIAGNOSIS — K219 Gastro-esophageal reflux disease without esophagitis: Secondary | ICD-10-CM | POA: Diagnosis not present

## 2017-08-17 NOTE — Op Note (Signed)
NAME: Chase Guerrero, Chase Guerrero MEDICAL RECORD OQ:[id number] ACCOUNT [id number] DATE OF BIRTH:11-17-1945 FACILITY: [facility] LOCATION: [facility] PHYSICIAN:Lacole Komorowski VAN TRIGT III, MD  OPERATIVE REPORT  DATE OF PROCEDURE:  08/16/2017  OPERATION:  Placement of left PleurX catheter.  SURGEON:  Ivin Poot, MD  ANESTHESIA:  MAC with local 1% lidocaine.  PREOPERATIVE DIAGNOSIS:  Recurrent pleural effusion on the left side after coronary artery bypass grafting with rapid recurrence after ultrasound-guided thoracentesis.  POSTOPERATIVE DIAGNOSIS:  Recurrent pleural effusion on the left side after coronary artery bypass grafting with rapid recurrence after ultrasound-guided thoracentesis.  PROCEDURE:  The patient was prepared for complex left PleurX catheter after being assessed and counseled in the office and again in the preop holding area.  The patient understood the indications, benefits, and alternatives to placement of PleurX  catheter for management of his postoperative recurrent left pleural effusion.  He understood the risks of bleeding, pneumothorax and recurrent effusion and infection.  DESCRIPTION OF PROCEDURE:  The patient was placed supine on the operating table and conscious sedation was then given intravenously by anesthesia and monitored carefully.  The left chest was prepped and draped using a sterile field.  A proper time-out  was performed.  Lidocaine 1% was infiltrated in the anterior axillary line at the fifth interspace at the midclavicular line at the left costal margin.  Two small incisions were made in each ear.  Through the upper incision, a guidewire was passed and  the pleural space using a Seldinger technique with the assistance of C-arm fluoroscopy.  Next, the PleurX catheter was tunneled from the lower incision to the upper incision.  Next, over the guidewire, a dilator then dilator sheath system was inserted into the pleural space with exit of clear yellow pleural  fluid.  Through the tear-away sheath, the PleurX catheter after being cut to the appropriate length was fed and placed in the pleural space in the subpleural position.  The sheath was removed.  The catheter was then drained of 800 mL of clear xanthochromic fluid.  The 2 incisions were closed in a standard fashion and a sterile dressing was applied.  The patient was returned to recovery room where a follow up chest x-ray showed the catheter in  good position and good drainage of the pleural effusion.  TN/NUANCE  D:08/17/2017 T:08/17/2017 JOB:[id number]

## 2017-08-23 ENCOUNTER — Other Ambulatory Visit: Payer: Self-pay | Admitting: Cardiothoracic Surgery

## 2017-08-23 DIAGNOSIS — Z951 Presence of aortocoronary bypass graft: Secondary | ICD-10-CM

## 2017-08-24 ENCOUNTER — Other Ambulatory Visit: Payer: Self-pay

## 2017-08-24 ENCOUNTER — Ambulatory Visit (INDEPENDENT_AMBULATORY_CARE_PROVIDER_SITE_OTHER): Payer: Self-pay | Admitting: Cardiothoracic Surgery

## 2017-08-24 ENCOUNTER — Ambulatory Visit
Admission: RE | Admit: 2017-08-24 | Discharge: 2017-08-24 | Disposition: A | Discharge status: Home / Self Care | Payer: PPO | Source: Ambulatory Visit | Attending: Cardiothoracic Surgery | Admitting: Cardiothoracic Surgery

## 2017-08-24 ENCOUNTER — Encounter: Payer: Self-pay | Admitting: Cardiothoracic Surgery

## 2017-08-24 VITALS — BP 110/71 | HR 62 | Resp 18 | Ht 70.0 in | Wt 180.4 lb

## 2017-08-24 DIAGNOSIS — Z951 Presence of aortocoronary bypass graft: Secondary | ICD-10-CM

## 2017-08-24 DIAGNOSIS — J90 Pleural effusion, not elsewhere classified: Secondary | ICD-10-CM | POA: Diagnosis not present

## 2017-08-24 DIAGNOSIS — Z9689 Presence of other specified functional implants: Secondary | ICD-10-CM

## 2017-08-24 DIAGNOSIS — I2510 Atherosclerotic heart disease of native coronary artery without angina pectoris: Secondary | ICD-10-CM

## 2017-08-24 DIAGNOSIS — J9811 Atelectasis: Secondary | ICD-10-CM | POA: Diagnosis not present

## 2017-08-24 NOTE — Progress Notes (Signed)
PCP is [name], MD Referring Provider is Leonie Man, MD  Chief Complaint  Patient presents with  . 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  Chest x-ray today shows Pleurx catheter in good position with only minimal effusion-blunting of costophrenic angle  Patient's appetite is improved and oral intake improved. No shortness of breath.  No ankle edema.  He is off Lasix.   Past Medical History:  Diagnosis Date  . Arthritis   . Atrial fibrillation ([place])   . Cancer ([place])    skin  . Coronary artery disease   . Enlarged prostate   . GERD (gastroesophageal reflux disease)   . Hypertension   . Myocardial infarction ([facility])    mild  . Pleural effusion   . Sleep apnea    not wearing  cpap  not worn in 9-10 yrs    Past Surgical History:  Procedure Laterality Date  . CHEST TUBE INSERTION Left 08/16/2017   Procedure: INSERTION PLEURAL DRAINAGE CATHETER;  Surgeon: Ivin Poot, MD;  Location: [facility];  Service: Thoracic;  Laterality: Left;  . COLONOSCOPY N/A 07/23/2015   Procedure: COLONOSCOPY;  Surgeon: Daneil Dolin, MD;  [facility];  Service: Endoscopy;  Laterality: N/A;  10:30 Am  . CORONARY ARTERY BYPASS GRAFT N/A 06/27/2017   Procedure: CORONARY ARTERY BYPASS GRAFTING (CABG) x 2  WITH ENDOSCOPIC HARVESTING OF RIGHT SAPHENOUS VEIN;  Surgeon: Ivin Poot, MD;  Location: [facility];  Service: Open Heart Surgery;  Laterality: N/A;  . IR THORACENTESIS ASP PLEURAL SPACE W/IMG GUIDE  08/08/2017  . LEFT HEART CATH AND CORONARY ANGIOGRAPHY N/A 06/14/2017   Procedure: LEFT HEART CATH AND CORONARY ANGIOGRAPHY;  Surgeon: Leonie Man, MD;  Location: [facility];  Service: Cardiovascular;  Laterality: N/A;  . Right hand surgery    . Right knee arthroscopy    . TEE WITHOUT CARDIOVERSION N/A 06/27/2017    Procedure: TRANSESOPHAGEAL ECHOCARDIOGRAM (TEE);  Surgeon: Prescott Gum, Collier Salina, MD;  Location: [facility];  Service: Open Heart Surgery;  Laterality: N/A;    Family History  Problem Relation Age of Onset  . Alzheimer's disease Mother   . Arthritis Sister   . Heart attack Brother   . Hypercholesterolemia Sister   . Liver disease Sister     Social History Social History   Tobacco Use  . Smoking status: Former Smoker    Packs/day: 0.50    Years: 20.00    Pack years: 10.00    Types: Cigarettes  . Smokeless tobacco: Former Systems developer    Types: Snuff  . Tobacco comment: USING NICORETTE GUM TO TRY AND STOP USING SMOKELESS TOBACCO  Substance Use Topics  . Alcohol use: No  . Drug use: No    Current Outpatient Medications  Medication Sig Dispense Refill  . acetaminophen (TYLENOL) 500 MG tablet Take 2 tablets (1,000 mg total) by mouth every 6 (six) hours as needed for mild pain or fever. 30 tablet 0  . aspirin EC 81 MG tablet Take 81 mg by mouth daily.    Marland Kitchen atorvastatin (LIPITOR) 80 MG tablet Take 1 tablet (80 mg total) by mouth daily. 90 tablet 3  . cetirizine (ZYRTEC) 10 MG tablet Take 10 mg by mouth daily as needed for allergies.    . finasteride (  PROSCAR) 5 MG tablet Take 5 mg by mouth daily.    . fluticasone (FLONASE) 50 MCG/ACT nasal spray Place 1 spray into both nostrils daily as needed for allergies or rhinitis.    . furosemide (LASIX) 20 MG tablet Take 1 tablet (20 mg total) by mouth daily as needed. Take one tab by mouth daily x 3 days, then only as needed for swelling or edema 90 tablet 0  . Menthol, Topical Analgesic, (BIOFREEZE EX) Apply 1 application topically daily as needed (pain).    . metoprolol tartrate (LOPRESSOR) 50 MG tablet Take 1 tablet (50 mg total) by mouth 2 (two) times daily. 180 tablet 3  . nitroGLYCERIN (NITROSTAT) 0.4 MG SL tablet Place 0.4 mg under the tongue every 5 (five) minutes as needed for chest pain.     Marland Kitchen OVER THE COUNTER MEDICATION Apply 1 application  topically daily as needed (pain). [place] Gel otc pain gel    . potassium chloride SA (K-DUR,KLOR-CON) 20 MEQ tablet Take 1 tablet (20 mEq total) by mouth daily as needed (only on the day you take the as needed Lasix). 90 tablet 0  . ranitidine (ZANTAC) 150 MG tablet Take 150 mg by mouth daily as needed for heartburn.    . Soft Lens Products (SENSITIVE EYES SALINE) SOLN Place 1 drop into both eyes 3 (three) times daily.     No current facility-administered medications for this visit.     No Known Allergies  Review of Systems  No fever Some pain associated with the suction event on the catheter  BP 110/71 (BP Location: Left Arm, Patient Position: Sitting, Cuff Size: Normal)   Pulse 62   Resp 18   Ht 5\' 10"  (1.778 m)   Wt 180 lb 6.4 oz (81.8 kg)   SpO2 96% Comment: RA  BMI 25.88 kg/m  Physical Exam      Exam    General- alert and comfortable    Neck- no JVD, no cervical adenopathy palpable, no carotid bruit   Lungs- clear without rales, wheezes   Cor- regular rate and rhythm, no murmur , gallop   Abdomen- soft, non-tender   Extremities - warm, non-tender, minimal edema   Neuro- oriented, appropriate, no focal weakness   Diagnostic Tests: Chest x-ray clear Pleurx in good position  Impression: Recurrent left pleural effusion being managed effectively with Pleurx catheter.  Fluid has been clear and xanthochromic  Plan: Return in 4 weeks with chest x-ray.  If drainage volume persists at 100 cc will reduce drainage scheduled to twice a week, Mondays-Thursdays.   Len Childs, MD [facility] [phone]

## 2017-08-24 NOTE — Progress Notes (Signed)
All thank you

## 2017-09-07 DIAGNOSIS — Z4803 Encounter for change or removal of drains: Secondary | ICD-10-CM | POA: Diagnosis not present

## 2017-09-07 DIAGNOSIS — K219 Gastro-esophageal reflux disease without esophagitis: Secondary | ICD-10-CM | POA: Diagnosis not present

## 2017-09-07 DIAGNOSIS — I10 Essential (primary) hypertension: Secondary | ICD-10-CM | POA: Diagnosis not present

## 2017-09-07 DIAGNOSIS — J9589 Other postprocedural complications and disorders of respiratory system, not elsewhere classified: Secondary | ICD-10-CM | POA: Diagnosis not present

## 2017-09-07 DIAGNOSIS — Z85828 Personal history of other malignant neoplasm of skin: Secondary | ICD-10-CM | POA: Diagnosis not present

## 2017-09-07 DIAGNOSIS — Z7982 Long term (current) use of aspirin: Secondary | ICD-10-CM | POA: Diagnosis not present

## 2017-09-07 DIAGNOSIS — I4891 Unspecified atrial fibrillation: Secondary | ICD-10-CM | POA: Diagnosis not present

## 2017-09-07 DIAGNOSIS — J90 Pleural effusion, not elsewhere classified: Secondary | ICD-10-CM | POA: Diagnosis not present

## 2017-09-07 DIAGNOSIS — G4730 Sleep apnea, unspecified: Secondary | ICD-10-CM | POA: Diagnosis not present

## 2017-09-07 DIAGNOSIS — M1991 Primary osteoarthritis, unspecified site: Secondary | ICD-10-CM | POA: Diagnosis not present

## 2017-09-07 DIAGNOSIS — Z951 Presence of aortocoronary bypass graft: Secondary | ICD-10-CM | POA: Diagnosis not present

## 2017-09-07 DIAGNOSIS — I2510 Atherosclerotic heart disease of native coronary artery without angina pectoris: Secondary | ICD-10-CM | POA: Diagnosis not present

## 2017-09-07 DIAGNOSIS — N400 Enlarged prostate without lower urinary tract symptoms: Secondary | ICD-10-CM | POA: Diagnosis not present

## 2017-09-07 DIAGNOSIS — Z87891 Personal history of nicotine dependence: Secondary | ICD-10-CM | POA: Diagnosis not present

## 2017-09-15 ENCOUNTER — Ambulatory Visit: Payer: Self-pay | Admitting: Cardiology

## 2017-09-16 ENCOUNTER — Encounter: Payer: Self-pay | Admitting: Cardiology

## 2017-09-16 ENCOUNTER — Ambulatory Visit: Payer: PPO | Admitting: Cardiology

## 2017-09-16 ENCOUNTER — Other Ambulatory Visit: Payer: Self-pay

## 2017-09-16 VITALS — BP 105/63 | HR 61 | Ht 70.0 in | Wt 186.0 lb

## 2017-09-16 DIAGNOSIS — Z951 Presence of aortocoronary bypass graft: Secondary | ICD-10-CM

## 2017-09-16 DIAGNOSIS — I2510 Atherosclerotic heart disease of native coronary artery without angina pectoris: Secondary | ICD-10-CM

## 2017-09-16 DIAGNOSIS — I10 Essential (primary) hypertension: Secondary | ICD-10-CM

## 2017-09-16 NOTE — Patient Instructions (Signed)
Medication Instructions:  Continue all current medications.  Labwork:  BMET, Magnesium, FLP - orders given today.  Reminder:  Nothing to eat or drink after 12 midnight prior to labs.  Office will contact with results via phone or letter.    Testing/Procedures: none  Follow-Up: Your physician wants you to follow up in: 6 months.  You will receive a reminder letter in the mail one-two months in advance.  If you don't receive a letter, please call our office to schedule the follow up appointment   Any Other Special Instructions Will Be Listed Below (If Applicable).  If you need a refill on your cardiac medications before your next appointment, please call your pharmacy.

## 2017-09-16 NOTE — Progress Notes (Signed)
Clinical Summary Chase Guerrero is a 72 y.o.male seen today for follow up of the following medical problems.   1. CAD - seen in 05/2017 for chest pain - 05/2017 high risk nuclear stress test as reported below, referred for cath - 06/2017 cath distal LM to LAD 100%, LVEF 35-45% by LV gram, referred for CABG - 06/27/17 CABG with LIMA-LAD, SVG-diag -07/2017 echo LVEF 60-65%.    - has not been on ACE-I due to labile renal function. In hospital was not started due to labile bp - no recent chest pain. SOB/DOE better after pleural effusion has been drained.  - compliant with meds  2. Postop afib - recurrent issues with postop afib - treated with IV amio, converted to oral. Was to take 400mg  bid x 7 days, then go to 200mg  x 2 weeks.  - started on xarelto  - nose bleeds on xarelto in 07/2017. Held for a few days. Later stopped by CT surgery since no afib recurrence.  - no recent palpitations.   3. Pleural effusion - recurrent after surgery, pleurx catheter  placed 08/2017 - has f/u with Dr Prescott Gum.  - takes lasix just prn, infrequently.   4. AKI - Cr up to 1.6 in 08/2017. Only taking lasix about 4 times total since our last visit   Past Medical History:  Diagnosis Date  . Arthritis   . Atrial fibrillation ([place])   . Cancer ([place])    skin  . Coronary artery disease   . Enlarged prostate   . GERD (gastroesophageal reflux disease)   . Hypertension   . Myocardial infarction ([facility])    mild  . Pleural effusion   . Sleep apnea    not wearing  cpap  not worn in 9-10 yrs     No Known Allergies   Current Outpatient Medications  Medication Sig Dispense Refill  . acetaminophen (TYLENOL) 500 MG tablet Take 2 tablets (1,000 mg total) by mouth every 6 (six) hours as needed for mild pain or fever. 30 tablet 0  . aspirin EC 81 MG tablet Take 81 mg by mouth daily.    Marland Kitchen atorvastatin (LIPITOR) 80 MG tablet Take 1 tablet (80 mg total) by mouth daily. 90 tablet 3  . cetirizine (ZYRTEC) 10  MG tablet Take 10 mg by mouth daily as needed for allergies.    . finasteride (PROSCAR) 5 MG tablet Take 5 mg by mouth daily.    . fluticasone (FLONASE) 50 MCG/ACT nasal spray Place 1 spray into both nostrils daily as needed for allergies or rhinitis.    . furosemide (LASIX) 20 MG tablet Take 1 tablet (20 mg total) by mouth daily as needed. Take one tab by mouth daily x 3 days, then only as needed for swelling or edema 90 tablet 0  . Menthol, Topical Analgesic, (BIOFREEZE EX) Apply 1 application topically daily as needed (pain).    . metoprolol tartrate (LOPRESSOR) 50 MG tablet Take 1 tablet (50 mg total) by mouth 2 (two) times daily. 180 tablet 3  . nitroGLYCERIN (NITROSTAT) 0.4 MG SL tablet Place 0.4 mg under the tongue every 5 (five) minutes as needed for chest pain.     Marland Kitchen OVER THE COUNTER MEDICATION Apply 1 application topically daily as needed (pain). [place] Gel otc pain gel    . potassium chloride SA (K-DUR,KLOR-CON) 20 MEQ tablet Take 1 tablet (20 mEq total) by mouth daily as needed (only on the day you take the as needed Lasix). [place]  tablet 0  . ranitidine (ZANTAC) 150 MG tablet Take 150 mg by mouth daily as needed for heartburn.    . Soft Lens Products (SENSITIVE EYES SALINE) SOLN Place 1 drop into both eyes 3 (three) times daily.     No current facility-administered medications for this visit.      Past Surgical History:  Procedure Laterality Date  . CHEST TUBE INSERTION Left 08/16/2017   Procedure: INSERTION PLEURAL DRAINAGE CATHETER;  Surgeon: Ivin Poot, MD;  Location: [facility];  Service: Thoracic;  Laterality: Left;  . COLONOSCOPY N/A 07/23/2015   Procedure: COLONOSCOPY;  Surgeon: Daneil Dolin, MD;  [facility];  Service: Endoscopy;  Laterality: N/A;  10:30 Am  . CORONARY ARTERY BYPASS GRAFT N/A 06/27/2017   Procedure: CORONARY ARTERY BYPASS GRAFTING (CABG) x 2  WITH ENDOSCOPIC HARVESTING OF RIGHT SAPHENOUS VEIN;  Surgeon: Ivin Poot, MD;  Location: [facility];   Service: Open Heart Surgery;  Laterality: N/A;  . IR THORACENTESIS ASP PLEURAL SPACE W/IMG GUIDE  08/08/2017  . LEFT HEART CATH AND CORONARY ANGIOGRAPHY N/A 06/14/2017   Procedure: LEFT HEART CATH AND CORONARY ANGIOGRAPHY;  Surgeon: Leonie Man, MD;  Location: [facility];  Service: Cardiovascular;  Laterality: N/A;  . Right hand surgery    . Right knee arthroscopy    . TEE WITHOUT CARDIOVERSION N/A 06/27/2017   Procedure: TRANSESOPHAGEAL ECHOCARDIOGRAM (TEE);  Surgeon: Prescott Gum, Collier Salina, MD;  Location: [facility];  Service: Open Heart Surgery;  Laterality: N/A;     No Known Allergies    Family History  Problem Relation Age of Onset  . Alzheimer's disease Mother   . Arthritis Sister   . Heart attack Brother   . Hypercholesterolemia Sister   . Liver disease Sister      Social History Mr. Haug reports that he has quit smoking. His smoking use included cigarettes. He has a 10.00 pack-year smoking history. He has quit using smokeless tobacco.  His smokeless tobacco use included snuff. Mr. Shakespeare reports that he does not drink alcohol.   Review of Systems CONSTITUTIONAL: No weight loss, fever, chills, weakness or fatigue.  HEENT: Eyes: No visual loss, blurred vision, double vision or yellow sclerae.No hearing loss, sneezing, congestion, runny nose or sore throat.  SKIN: No rash or itching.  CARDIOVASCULAR: per hpi RESPIRATORY: No shortness of breath, cough or sputum.  GASTROINTESTINAL: No anorexia, nausea, vomiting or diarrhea. No abdominal pain or blood.  GENITOURINARY: No burning on urination, no polyuria NEUROLOGICAL: No headache, dizziness, syncope, paralysis, ataxia, numbness or tingling in the extremities. No change in bowel or bladder control.  MUSCULOSKELETAL: No muscle, back pain, joint pain or stiffness.  LYMPHATICS: No enlarged nodes. No history of splenectomy.  PSYCHIATRIC: No history of depression or anxiety.  ENDOCRINOLOGIC: No reports of sweating, cold or heat  intolerance. No polyuria or polydipsia.  Marland Kitchen   Physical Examination Vitals:   09/16/17 0952  BP: 105/63  Pulse: 61  SpO2: 99%   Vitals:   09/16/17 0952  Weight: 186 lb (84.4 kg)  Height: 5\' 10"  (1.778 m)    Gen: resting comfortably, no acute distress HEENT: no scleral icterus, pupils equal round and reactive, no palptable cervical adenopathy,  CV: RRR, no m/r/g, no jvd Resp: Clear to auscultation bilaterally GI: abdomen is soft, non-tender, non-distended, normal bowel sounds, no hepatosplenomegaly MSK: extremities are warm, trace bilateral edema Skin: warm, no rash Neuro:  no focal deficits Psych: appropriate affect   Diagnostic Studies 05/2017 nuclear  stress  Blood pressure demonstrated a hypertensive response to exercise.  1 mm horizontal ST segment depressions in leads II, III, and aVF in recovery with nonspecific horizontal ST segment depressions in leads V5 and V6. There was excessive artifact with stress which limits interpretation.  Defect 1: There is a large defect of moderate severity present in the mid anterior, mid anteroseptal, mid inferoseptal, apical anterior, apical septal and apical inferior location.  Findings consistent with a large degree of ischemia.  This is a high risk study.  Nuclear stress EF: 50%.   07/2017 echo Study Conclusions  - Left ventricle: The cavity size was normal. Wall thickness was   normal. Systolic function was normal. The estimated ejection   fraction was in the range of 60% to 65%. Wall motion was normal;   there were no regional wall motion abnormalities. Left   ventricular diastolic function parameters were normal. Doppler   parameters are consistent with indeterminate ventricular filling   pressure. - Aortic valve: There was mild regurgitation. - Mitral valve: There was mild regurgitation. - Right ventricle: Systolic function appeared grossly normal (RV   poorly visualized, I doubt accuracy of TAPSE). - Pericardium,  extracardiac: There was a left pleural effusion.  Assessment and Plan  1. CAD - overall doing well post CABG, no significant symptoms - continue current meds.   2. Postop afib - now off amio and xarelto - no palpitations. EKG today shows maintaing SR. Continue to monitor   3. AKI - Cr up to 1.6 in July 2019. He is only taking lasix very sporadically, about 4 doses in 2 months - repeat BMET/MG.   Recheck lipids    [name], M.D

## 2017-09-20 ENCOUNTER — Other Ambulatory Visit: Payer: Self-pay | Admitting: Cardiothoracic Surgery

## 2017-09-20 DIAGNOSIS — I10 Essential (primary) hypertension: Secondary | ICD-10-CM | POA: Diagnosis not present

## 2017-09-20 DIAGNOSIS — Z951 Presence of aortocoronary bypass graft: Secondary | ICD-10-CM | POA: Diagnosis not present

## 2017-09-20 DIAGNOSIS — I208 Other forms of angina pectoris: Secondary | ICD-10-CM | POA: Diagnosis not present

## 2017-09-21 ENCOUNTER — Encounter: Payer: Self-pay | Admitting: Cardiothoracic Surgery

## 2017-09-21 ENCOUNTER — Other Ambulatory Visit: Payer: Self-pay

## 2017-09-21 ENCOUNTER — Ambulatory Visit
Admission: RE | Admit: 2017-09-21 | Discharge: 2017-09-21 | Disposition: A | Discharge status: Home / Self Care | Payer: PPO | Source: Ambulatory Visit | Attending: Cardiothoracic Surgery | Admitting: Cardiothoracic Surgery

## 2017-09-21 ENCOUNTER — Ambulatory Visit (INDEPENDENT_AMBULATORY_CARE_PROVIDER_SITE_OTHER): Payer: Self-pay | Admitting: Cardiothoracic Surgery

## 2017-09-21 VITALS — BP 114/66 | HR 66 | Resp 16 | Ht 70.0 in | Wt 186.0 lb

## 2017-09-21 DIAGNOSIS — Z951 Presence of aortocoronary bypass graft: Secondary | ICD-10-CM

## 2017-09-21 DIAGNOSIS — Z9689 Presence of other specified functional implants: Secondary | ICD-10-CM

## 2017-09-21 LAB — LIPID PANEL
CHOLESTEROL TOTAL: 121 mg/dL (ref 100–199)
Chol/HDL Ratio: 2.8 ratio (ref 0.0–5.0)
HDL: 43 mg/dL (ref >39)
LDL Calculated: 61 mg/dL (ref 0–99)
TRIGLYCERIDES: 85 mg/dL (ref 0–149)
VLDL Cholesterol Cal: 17 mg/dL (ref 5–40)

## 2017-09-21 LAB — BASIC METABOLIC PANEL
BUN / CREAT RATIO: 9 — AB (ref 10–24)
BUN: 13 mg/dL (ref 8–27)
CHLORIDE: 106 mmol/L (ref 96–106)
CO2: 24 mmol/L (ref 20–29)
Calcium: 9 mg/dL (ref 8.6–10.2)
Creatinine, Ser: 1.39 mg/dL — ABNORMAL HIGH (ref 0.76–1.27)
GFR calc Af Amer: 58 mL/min/{1.73_m2} — ABNORMAL LOW (ref >59)
GFR calc non Af Amer: 50 mL/min/{1.73_m2} — ABNORMAL LOW (ref >59)
GLUCOSE: 105 mg/dL — AB (ref 65–99)
Potassium: 4.4 mmol/L (ref 3.5–5.2)
SODIUM: 142 mmol/L (ref 134–144)

## 2017-09-21 LAB — MAGNESIUM: Magnesium: 2 mg/dL (ref 1.6–2.3)

## 2017-09-21 NOTE — Progress Notes (Signed)
PCP is [name], MD Referring Provider is Leonie Man, MD  Chief Complaint  Patient presents with  . Routine Post Op    4 wk f/u s/p CABG X 2 with a CXR    HPI: 4-week Pleurx catheter check Left Pleurx catheter placed after recurrent left pleural effusion following CABG May 2019. Drainage schedule is Monday Wednesday Friday.  Drainage volume has significantly decreased now down to 100 cc.  Patient will decrease drainage scheduled to Mondays and Thursdays and if drainage remains around 100 cc reduce drainage scheduled to every Monday.  We are approaching the time more catheter can be removed.  Patient will take Lasix 20 mg Monday Wednesday Friday in order to help dry up the pleural effusion.  Chest x-ray performed today shows minimal pleural effusion.  Catheter in good position. On exam the catheter site is clean and dry.  Past Medical History:  Diagnosis Date  . Arthritis   . Atrial fibrillation ([place])   . Cancer ([place])    skin  . Coronary artery disease   . Enlarged prostate   . GERD (gastroesophageal reflux disease)   . Hypertension   . Myocardial infarction ([facility])    mild  . Pleural effusion   . Sleep apnea    not wearing  cpap  not worn in 9-10 yrs    Past Surgical History:  Procedure Laterality Date  . CHEST TUBE INSERTION Left 08/16/2017   Procedure: INSERTION PLEURAL DRAINAGE CATHETER;  Surgeon: Ivin Poot, MD;  Location: [place];  Service: Thoracic;  Laterality: Left;  . COLONOSCOPY N/A 07/23/2015   Procedure: COLONOSCOPY;  Surgeon: Daneil Dolin, MD;  [facility];  Service: Endoscopy;  Laterality: N/A;  10:30 Am  . CORONARY ARTERY BYPASS GRAFT N/A 06/27/2017   Procedure: CORONARY ARTERY BYPASS GRAFTING (CABG) x 2  WITH ENDOSCOPIC HARVESTING OF RIGHT SAPHENOUS VEIN;  Surgeon: Ivin Poot, MD;  Location: [facility] [place];  Service: Open Heart Surgery;  Laterality: N/A;  . IR THORACENTESIS ASP PLEURAL SPACE W/IMG GUIDE  08/08/2017  . LEFT HEART CATH AND  CORONARY ANGIOGRAPHY N/A 06/14/2017   Procedure: LEFT HEART CATH AND CORONARY ANGIOGRAPHY;  Surgeon: Leonie Man, MD;  Location: [facility];  Service: Cardiovascular;  Laterality: N/A;  . Right hand surgery    . Right knee arthroscopy    . TEE WITHOUT CARDIOVERSION N/A 06/27/2017   Procedure: TRANSESOPHAGEAL ECHOCARDIOGRAM (TEE);  Surgeon: Prescott Gum, Collier Salina, MD;  Location: [facility];  Service: Open Heart Surgery;  Laterality: N/A;    Family History  Problem Relation Age of Onset  . Alzheimer's disease Mother   . Arthritis Sister   . Heart attack Brother   . Hypercholesterolemia Sister   . Liver disease Sister     Social History Social History   Tobacco Use  . Smoking status: Former Smoker    Packs/day: 0.50    Years: 20.00    Pack years: 10.00    Types: Cigarettes  . Smokeless tobacco: Former Systems developer    Types: Snuff  . Tobacco comment: USING NICORETTE GUM TO TRY AND STOP USING SMOKELESS TOBACCO  Substance Use Topics  . Alcohol use: No  . Drug use: No    Current Outpatient Medications  Medication Sig Dispense Refill  . acetaminophen (TYLENOL) 500 MG tablet Take 2 tablets (1,000 mg total) by mouth every 6 (six) hours as needed for mild pain or fever. 30 tablet 0  . aspirin EC 81 MG tablet Take  81 mg by mouth daily.    Marland Kitchen atorvastatin (LIPITOR) 80 MG tablet Take 1 tablet (80 mg total) by mouth daily. 90 tablet 3  . cetirizine (ZYRTEC) 10 MG tablet Take 10 mg by mouth daily as needed for allergies.    . finasteride (PROSCAR) 5 MG tablet Take 5 mg by mouth daily.    . fluticasone (FLONASE) 50 MCG/ACT nasal spray Place 1 spray into both nostrils daily as needed for allergies or rhinitis.    . furosemide (LASIX) 20 MG tablet Take 1 tablet (20 mg total) by mouth daily as needed. Take one tab by mouth daily x 3 days, then only as needed for swelling or edema 90 tablet 0  . Menthol, Topical Analgesic, (BIOFREEZE EX) Apply 1 application topically daily as needed (pain).    .  metoprolol tartrate (LOPRESSOR) 50 MG tablet Take 1 tablet (50 mg total) by mouth 2 (two) times daily. 180 tablet 3  . nitroGLYCERIN (NITROSTAT) 0.4 MG SL tablet Place 0.4 mg under the tongue every 5 (five) minutes as needed for chest pain.     Marland Kitchen OVER THE COUNTER MEDICATION Apply 1 application topically daily as needed (pain). [place] Gel otc pain gel    . potassium chloride SA (K-DUR,KLOR-CON) 20 MEQ tablet Take 1 tablet (20 mEq total) by mouth daily as needed (only on the day you take the as needed Lasix). 90 tablet 0  . ranitidine (ZANTAC) 150 MG tablet Take 150 mg by mouth daily as needed for heartburn.    . Soft Lens Products (SENSITIVE EYES SALINE) SOLN Place 1 drop into both eyes 3 (three) times daily.     No current facility-administered medications for this visit.     No Known Allergies  Review of Systems   Patient walking 1.3 miles daily. Patient is scheduled to start outpatient cardiac rehab at [facility] in 5 days  BP 114/66 (BP Location: Right Arm, Patient Position: Sitting, Cuff Size: Large)   Pulse 66   Resp 16   Ht 5\' 10"  (1.778 m)   Wt 186 lb (84.4 kg)   SpO2 99% Comment: ON RA  BMI 26.69 kg/m  Physical Exam      Exam    General- alert and comfortable    Neck- no JVD, no cervical adenopathy palpable, no carotid bruit   Lungs- clear without rales, wheezes   Cor- regular rate and rhythm, no murmur , gallop   Abdomen- soft, non-tender   Extremities - warm, non-tender, minimal edema   Neuro- oriented, appropriate, no focal weakness   Diagnostic Tests: Chest x-ray clear  Impression: Recurrent left pleural effusion is significantly improved. We are approaching the time when catheter can be removed. Plan: Continue Lasix 20 mg Monday Wednesday Friday Pleurx catheter drainage schedule as above Return for review of situation in 3 weeks to hopefully discuss removing catheter. Len Childs, MD [facility] [phone]

## 2017-09-22 ENCOUNTER — Telehealth: Payer: Self-pay | Admitting: *Deleted

## 2017-09-22 NOTE — Telephone Encounter (Signed)
-----   Message from [name], MD sent at 09/22/2017 12:54 PM EDT ----- Labs look good. Renal function remains mildly decreased but improved from check last month. Continue to monitor  J BrancH MD

## 2017-09-22 NOTE — Telephone Encounter (Signed)
Pt aware - routed to pcp  

## 2017-09-26 ENCOUNTER — Telehealth: Payer: Self-pay

## 2017-09-26 NOTE — Telephone Encounter (Signed)
Chase Guerrero wife, Chase Guerrero called and stated that Chase Guerrero PleurX drained a scant amount with " a lot of bubbles".  She stated that they drained today and last Thursday with the same results.  I advised her for him to drain again this coming Thursday, Aug 22nd and if there is still a scant amount of drainage to decrease his drainage to once weekly starting on the following Monday per Dr. Lucianne Lei Trigt's notes.  Patient's wife acknowledged receipt and stated that they are aware of his follow-up appointment with Dr. Prescott Gum.  All questions and concerns were addressed.

## 2017-10-11 ENCOUNTER — Other Ambulatory Visit: Payer: Self-pay | Admitting: Cardiothoracic Surgery

## 2017-10-11 DIAGNOSIS — Z951 Presence of aortocoronary bypass graft: Secondary | ICD-10-CM

## 2017-10-12 ENCOUNTER — Ambulatory Visit
Admission: RE | Admit: 2017-10-12 | Discharge: 2017-10-12 | Disposition: A | Discharge status: Home / Self Care | Payer: PPO | Source: Ambulatory Visit | Attending: Cardiothoracic Surgery | Admitting: Cardiothoracic Surgery

## 2017-10-12 ENCOUNTER — Ambulatory Visit: Payer: PPO | Admitting: Cardiothoracic Surgery

## 2017-10-12 ENCOUNTER — Encounter: Payer: Self-pay | Admitting: Cardiothoracic Surgery

## 2017-10-12 ENCOUNTER — Other Ambulatory Visit: Payer: Self-pay | Admitting: *Deleted

## 2017-10-12 VITALS — BP 97/58 | HR 70 | Resp 20 | Ht 70.0 in | Wt 188.0 lb

## 2017-10-12 DIAGNOSIS — Z951 Presence of aortocoronary bypass graft: Secondary | ICD-10-CM

## 2017-10-12 DIAGNOSIS — J90 Pleural effusion, not elsewhere classified: Secondary | ICD-10-CM

## 2017-10-12 DIAGNOSIS — I2510 Atherosclerotic heart disease of native coronary artery without angina pectoris: Secondary | ICD-10-CM | POA: Diagnosis not present

## 2017-10-12 DIAGNOSIS — Z9689 Presence of other specified functional implants: Secondary | ICD-10-CM | POA: Diagnosis not present

## 2017-10-12 DIAGNOSIS — I25810 Atherosclerosis of coronary artery bypass graft(s) without angina pectoris: Secondary | ICD-10-CM | POA: Diagnosis not present

## 2017-10-12 NOTE — Progress Notes (Signed)
PCP is [name], MD Referring Provider is Leonie Man, MD  Chief Complaint  Patient presents with  . Routine Post Op    3 week f/u with CXR left pleural effusion after CABG    HPI: The patient had a CABG with recurrent left pleural effusion.  The patient had a Pleurx catheter placed late June.  Catheter drainage has now ceased.  Chest x-ray is clear.  He has no peripheral edema. We will plan on removing the catheter in the short stay area the hospital tomorrow.  Patient's blood pressure is soft and home blood pressures have been 110/70.  We will reduce metoprolol to 25 mg twice daily  Past Medical History:  Diagnosis Date  . Arthritis   . Atrial fibrillation ([place])   . Cancer ([place])    skin  . Coronary artery disease   . Enlarged prostate   . GERD (gastroesophageal reflux disease)   . Hypertension   . Myocardial infarction ([facility])    mild  . Pleural effusion   . Sleep apnea    not wearing  cpap  not worn in 9-10 yrs    Past Surgical History:  Procedure Laterality Date  . CHEST TUBE INSERTION Left 08/16/2017   Procedure: INSERTION PLEURAL DRAINAGE CATHETER;  Surgeon: Ivin Poot, MD;  Location: [facility];  Service: Thoracic;  Laterality: Left;  . COLONOSCOPY N/A 07/23/2015   Procedure: COLONOSCOPY;  Surgeon: Daneil Dolin, MD;  [facility];  Service: Endoscopy;  Laterality: N/A;  10:30 Am  . CORONARY ARTERY BYPASS GRAFT N/A 06/27/2017   Procedure: CORONARY ARTERY BYPASS GRAFTING (CABG) x 2  WITH ENDOSCOPIC HARVESTING OF RIGHT SAPHENOUS VEIN;  Surgeon: Ivin Poot, MD;  Location: [facility];  Service: Open Heart Surgery;  Laterality: N/A;  . IR THORACENTESIS ASP PLEURAL SPACE W/IMG GUIDE  08/08/2017  . LEFT HEART CATH AND CORONARY ANGIOGRAPHY N/A 06/14/2017   Procedure: LEFT HEART CATH AND CORONARY ANGIOGRAPHY;  Surgeon: Leonie Man, MD;  Location: [facility];  Service: Cardiovascular;  Laterality: N/A;  . Right hand surgery    . Right knee arthroscopy     . TEE WITHOUT CARDIOVERSION N/A 06/27/2017   Procedure: TRANSESOPHAGEAL ECHOCARDIOGRAM (TEE);  Surgeon: Prescott Gum, Collier Salina, MD;  Location: [facility];  Service: Open Heart Surgery;  Laterality: N/A;    Family History  Problem Relation Age of Onset  . Alzheimer's disease Mother   . Arthritis Sister   . Heart attack Brother   . Hypercholesterolemia Sister   . Liver disease Sister     Social History Social History   Tobacco Use  . Smoking status: Former Smoker    Packs/day: 0.50    Years: 20.00    Pack years: 10.00    Types: Cigarettes  . Smokeless tobacco: Former Systems developer    Types: Snuff  . Tobacco comment: USING NICORETTE GUM TO TRY AND STOP USING SMOKELESS TOBACCO  Substance Use Topics  . Alcohol use: No  . Drug use: No    Current Outpatient Medications  Medication Sig Dispense Refill  . acetaminophen (TYLENOL) 500 MG tablet Take 2 tablets (1,000 mg total) by mouth every 6 (six) hours as needed for mild pain or fever. 30 tablet 0  . aspirin EC 81 MG tablet Take 81 mg by mouth daily.    Marland Kitchen atorvastatin (LIPITOR) 80 MG tablet Take 1 tablet (80 mg total) by mouth daily. 90 tablet 3  . cetirizine (ZYRTEC) 10 MG tablet Take 10  mg by mouth daily as needed for allergies.    . finasteride (PROSCAR) 5 MG tablet Take 5 mg by mouth daily.    . fluticasone (FLONASE) 50 MCG/ACT nasal spray Place 1 spray into both nostrils daily as needed for allergies or rhinitis.    . furosemide (LASIX) 20 MG tablet Take 1 tablet (20 mg total) by mouth daily as needed. Take one tab by mouth daily x 3 days, then only as needed for swelling or edema 90 tablet 0  . Menthol, Topical Analgesic, (BIOFREEZE EX) Apply 1 application topically daily as needed (pain).    . metoprolol tartrate (LOPRESSOR) 50 MG tablet Take 1 tablet (50 mg total) by mouth 2 (two) times daily. 180 tablet 3  . nitroGLYCERIN (NITROSTAT) 0.4 MG SL tablet Place 0.4 mg under the tongue every 5 (five) minutes as needed for chest pain.     Marland Kitchen OVER THE  COUNTER MEDICATION Apply 1 application topically daily as needed (pain). [place] Gel otc pain gel    . potassium chloride SA (K-DUR,KLOR-CON) 20 MEQ tablet Take 1 tablet (20 mEq total) by mouth daily as needed (only on the day you take the as needed Lasix). 90 tablet 0  . ranitidine (ZANTAC) 150 MG tablet Take 150 mg by mouth daily as needed for heartburn.    . Soft Lens Products (SENSITIVE EYES SALINE) SOLN Place 1 drop into both eyes 3 (three) times daily.     No current facility-administered medications for this visit.     No Known Allergies  Review of Systems  No edema or shortness of breath Scant drainage from Pleurx catheter BP (!) 97/58   Pulse 70   Resp 20   Ht 5\' 10"  (1.778 m)   Wt 188 lb (85.3 kg)   SpO2 97% Comment: RA  BMI 26.98 kg/m  Physical Exam      Exam    General- alert and comfortable    Neck- no JVD, no cervical adenopathy palpable, no carotid bruit   Lungs- clear without rales, wheezes   Cor- regular rate and rhythm, no murmur , gallop   Abdomen- soft, non-tender   Extremities - warm, non-tender, minimal edema   Neuro- oriented, appropriate, no focal weakness   Diagnostic Tests:  Chest x-ray clear Impression: Left pleural effusion resolved. Remove Pleurx catheter and short stay this week Plan: Return for removal of suture and exam, blood pressure measurement approximately 1 week.  Len Childs, MD [facility] [phone]

## 2017-10-13 ENCOUNTER — Encounter (HOSPITAL_COMMUNITY): Payer: Self-pay | Admitting: *Deleted

## 2017-10-13 ENCOUNTER — Encounter (HOSPITAL_COMMUNITY)
Admission: RE | Disposition: A | Discharge status: Home / Self Care | Payer: Self-pay | Source: Ambulatory Visit | Attending: Cardiothoracic Surgery

## 2017-10-13 ENCOUNTER — Ambulatory Visit (HOSPITAL_COMMUNITY)
Admission: RE | Admit: 2017-10-13 | Discharge: 2017-10-13 | Disposition: A | Discharge status: Home / Self Care | Payer: PPO | Source: Ambulatory Visit | Attending: Cardiothoracic Surgery | Admitting: Cardiothoracic Surgery

## 2017-10-13 DIAGNOSIS — Z4803 Encounter for change or removal of drains: Secondary | ICD-10-CM | POA: Diagnosis not present

## 2017-10-13 DIAGNOSIS — J90 Pleural effusion, not elsewhere classified: Secondary | ICD-10-CM | POA: Insufficient documentation

## 2017-10-13 HISTORY — PX: REMOVAL OF PLEURAL DRAINAGE CATHETER: SHX5080

## 2017-10-13 SURGERY — REMOVAL, CLOSED DRAINAGE CATHETER SYSTEM, PLEURAL
Anesthesia: Monitor Anesthesia Care | Laterality: Left

## 2017-10-13 NOTE — Procedures (Signed)
Chase Guerrero [id number] Jan 15, 1946   Preoperative Diagnosis: Recurrent Left Pleural Effusion Post operative Diagnosis: Recurrent Left Pleural Effusion   Procedure: Removal of Left Sided Pleur-x Catheter   Consent was obtained.  Left side was prepped and draped in sterile fashion.  Local anesthesia was placed using 10 ml of 1% lidocaine.  Site was palpated and the cuff was about 5 cm above the skin incision.  Due to this a small incision was made.  Blunt dissection was performed until cuff identified.  Adhesions were bluntly dissected without difficulty.  Catheter was removed without difficult.  Wounds were closed with 3-0 Nylon sutures.  Wound was cleaned and dry dressing was placed.  He tolerated the procedure without difficulty.  Blood Loss: Minimal   Discharged Home, in stable condition.  Will follow up at our office on 9/11 at 10:00 with CXR and suture removal   Bryelle Spiewak, PA-C

## 2017-10-13 NOTE — Discharge Instructions (Signed)
Follow up with Dr. Maralyn Sago office September 11 at 1000 am Chest xray at [facility] at 0930 am  No shower until Saturday  Redress site as needed if draining occurs

## 2017-10-14 ENCOUNTER — Encounter (HOSPITAL_COMMUNITY): Payer: Self-pay | Admitting: Cardiothoracic Surgery

## 2017-10-17 ENCOUNTER — Encounter (HOSPITAL_COMMUNITY)
Admission: RE | Admit: 2017-10-17 | Discharge: 2017-10-17 | Disposition: A | Discharge status: Home / Self Care | Payer: PPO | Source: Ambulatory Visit | Attending: Cardiology | Admitting: Cardiology

## 2017-10-17 ENCOUNTER — Encounter (HOSPITAL_COMMUNITY): Payer: Self-pay

## 2017-10-17 VITALS — BP 110/50 | HR 63 | Ht 70.0 in | Wt 189.6 lb

## 2017-10-17 DIAGNOSIS — Z951 Presence of aortocoronary bypass graft: Secondary | ICD-10-CM | POA: Insufficient documentation

## 2017-10-17 NOTE — Progress Notes (Signed)
Cardiac Individual Treatment Plan  Patient Details  Name: Chase Guerrero MRN: [id number] Date of Birth: 1946/01/22 Referring Provider:    Initial Encounter Date:   Visit Diagnosis: S/P CABG x 2  Patient's Home Medications on Admission:  Current Outpatient Medications:  .  acetaminophen (TYLENOL) 500 MG tablet, Take 2 tablets (1,000 mg total) by mouth every 6 (six) hours as needed for mild pain or fever., Disp: 30 tablet, Rfl: 0 .  aspirin EC 81 MG tablet, Take 81 mg by mouth daily., Disp: , Rfl:  .  atorvastatin (LIPITOR) 80 MG tablet, Take 1 tablet (80 mg total) by mouth daily., Disp: 90 tablet, Rfl: 3 .  cetirizine (ZYRTEC) 10 MG tablet, Take 10 mg by mouth daily as needed for allergies., Disp: , Rfl:  .  finasteride (PROSCAR) 5 MG tablet, Take 5 mg by mouth daily., Disp: , Rfl:  .  fluticasone (FLONASE) 50 MCG/ACT nasal spray, Place 1 spray into both nostrils daily as needed for allergies or rhinitis., Disp: , Rfl:  .  furosemide (LASIX) 20 MG tablet, Take 1 tablet (20 mg total) by mouth daily as needed. Take one tab by mouth daily x 3 days, then only as needed for swelling or edema (Patient taking differently: Take 20 mg by mouth every Monday, Wednesday, and Friday. Take one tab by mouth daily x 3 days, then only as needed for swelling or edema), Disp: 90 tablet, Rfl: 0 .  Menthol, Topical Analgesic, (BIOFREEZE EX), Apply 1 application topically daily as needed (pain)., Disp: , Rfl:  .  metoprolol tartrate (LOPRESSOR) 25 MG tablet, Take 12.5 mg by mouth 2 (two) times daily. , Disp: , Rfl:  .  nitroGLYCERIN (NITROSTAT) 0.4 MG SL tablet, Place 0.4 mg under the tongue every 5 (five) minutes as needed for chest pain. , Disp: , Rfl:  .  potassium chloride SA (K-DUR,KLOR-CON) 20 MEQ tablet, Take 1 tablet (20 mEq total) by mouth daily as needed (only on the day you take the as needed Lasix). (Patient taking differently: Take 20 mEq by mouth every Monday, Wednesday, and Friday. ), Disp: 90 tablet,  Rfl: 0 .  ranitidine (ZANTAC) 150 MG tablet, Take 150 mg by mouth daily as needed for heartburn., Disp: , Rfl:  .  Soft Lens Products (SENSITIVE EYES SALINE) SOLN, Place 1 drop into both eyes 3 (three) times daily., Disp: , Rfl:   Past Medical History: Past Medical History:  Diagnosis Date  . Arthritis   . Atrial fibrillation ([place])   . Cancer ([place])    skin  . Coronary artery disease   . Enlarged prostate   . GERD (gastroesophageal reflux disease)   . Hypertension   . Myocardial infarction ([facility])    mild  . Pleural effusion   . Sleep apnea    not wearing  cpap  not worn in 9-10 yrs    Tobacco Use: Social History   Tobacco Use  Smoking Status Former Smoker  . Packs/day: 0.50  . Years: 30.00  . Pack years: 15.00  . Types: Cigarettes  Smokeless Tobacco Former Systems developer  . Types: Snuff  Tobacco Comment   USING NICORETTE GUM TO TRY AND STOP USING SMOKELESS TOBACCO    Labs: Recent Review Flowsheet Data    Labs for ITP Cardiac and Pulmonary Rehab Latest Ref Rng & Units 06/27/2017 06/27/2017 06/28/2017 06/29/2017 09/20/2017   Cholestrol 100 - 199 mg/dL - - - - 121   LDLCALC 0 - 99 mg/dL - - - - 61  HDL >39 mg/dL - - - - 43   Trlycerides 0 - 149 mg/dL - - - - 85   Hemoglobin A1c 4.8 - 5.6 % - - - - -   PHART 7.350 - 7.450 - 7.350 - - -   PCO2ART 32.0 - 48.0 mmHg - 39.9 - - -   HCO3 20.0 - 28.0 mmol/L - 22.0 - - -   TCO2 22 - 32 mmol/L 24 23 24 24  -   ACIDBASEDEF 0.0 - 2.0 mmol/L - 3.0(H) - - -   O2SAT % - 99.0 - - -      Capillary Blood Glucose: Lab Results  Component Value Date   GLUCAP 96 07/01/2017   GLUCAP 124 (H) 06/30/2017   GLUCAP 111 (H) 06/30/2017   GLUCAP 116 (H) 06/30/2017   GLUCAP 122 (H) 06/30/2017     Exercise Target Goals: Exercise Program Goal: Individual exercise prescription set using results from initial 6 min walk test and THRR while considering  patient's activity barriers and safety.   Exercise Prescription Goal: Starting with aerobic activity  30 plus minutes a day, 3 days per week for initial exercise prescription. Provide home exercise prescription and guidelines that participant acknowledges understanding prior to discharge.  Activity Barriers & Risk Stratification: Activity Barriers & Cardiac Risk Stratification - 10/17/17 0953      Activity Barriers & Cardiac Risk Stratification   Activity Barriers  Back Problems   shoulders worn out   Cardiac Risk Stratification  High       6 Minute Walk: 6 Minute Walk    Row Name 10/17/17 0855         6 Minute Walk   Phase  Initial     Distance  1300 feet     Walk Time  6 minutes     # of Rest Breaks  0     MPH  2.46     METS  2.89     RPE  9     Perceived Dyspnea   7     VO2 Peak  9.27     Resting HR  63 bpm     Resting BP  110/50     Resting Oxygen Saturation   94 %     Exercise Oxygen Saturation  during 6 min walk  87 %     Max Ex. HR  87 bpm     Max Ex. BP  128/58     2 Minute Post BP  116/58        Oxygen Initial Assessment:   Oxygen Re-Evaluation:   Oxygen Discharge (Final Oxygen Re-Evaluation):   Initial Exercise Prescription:   Perform Capillary Blood Glucose checks as needed.  Exercise Prescription Changes:   Exercise Comments:   Exercise Goals and Review:  Exercise Goals    Row Name 10/17/17 0954             Exercise Goals   Increase Physical Activity  Yes       Intervention  Provide advice, education, support and counseling about physical activity/exercise needs.       Expected Outcomes  Short Term: Attend rehab on a regular basis to increase amount of physical activity.;Long Term: Add in home exercise to make exercise part of routine and to increase amount of physical activity.;Long Term: Exercising regularly at least 3-5 days a week.       Increase Strength and Stamina  Yes       Intervention  Provide advice, education,  support and counseling about physical activity/exercise needs.       Expected Outcomes  Short Term: Increase  workloads from initial exercise prescription for resistance, speed, and METs.;Long Term: Improve cardiorespiratory fitness, muscular endurance and strength as measured by increased METs and functional capacity (6MWT)       Able to understand and use rate of perceived exertion (RPE) scale  Yes       Intervention  Provide education and explanation on how to use RPE scale       Expected Outcomes  Short Term: Able to use RPE daily in rehab to express subjective intensity level;Long Term:  Able to use RPE to guide intensity level when exercising independently       Knowledge and understanding of Target Heart Rate Range (THRR)  Yes       Intervention  Provide education and explanation of THRR including how the numbers were predicted and where they are located for reference       Expected Outcomes  Short Term: Able to use daily as guideline for intensity in rehab;Long Term: Able to use THRR to govern intensity when exercising independently       Able to check pulse independently  Yes       Intervention  Provide education and demonstration on how to check pulse in carotid and radial arteries.       Expected Outcomes  Short Term: Able to explain why pulse checking is important during independent exercise;Long Term: Able to check pulse independently and accurately       Understanding of Exercise Prescription  Yes       Intervention  Provide education, explanation, and written materials on patient's individual exercise prescription       Expected Outcomes  Short Term: Able to explain program exercise prescription;Long Term: Able to explain home exercise prescription to exercise independently          Exercise Goals Re-Evaluation :    Discharge Exercise Prescription (Final Exercise Prescription Changes):   Nutrition:  Target Goals: Understanding of nutrition guidelines, daily intake of sodium 1500mg , cholesterol 200mg , calories 30% from fat and 7% or less from saturated fats, daily to have 5 or more  servings of fruits and vegetables.  Biometrics: Pre Biometrics - 10/17/17 0857      Pre Biometrics   Height  5\' 10"  (1.778 m)    Waist Circumference  40.5 inches    Hip Circumference  36 inches    Waist to Hip Ratio  1.12 %    Triceps Skinfold  4 mm    % Body Fat  22.4 %    Grip Strength  34.9 kg    Flexibility  0 in   bulging discs in back    Single Leg Stand  8 seconds        Nutrition Therapy Plan and Nutrition Goals: Nutrition Therapy & Goals - 10/17/17 1007      Personal Nutrition Goals   Personal Goal #2  Patient is easting heart healthy. Doctors want him to eat regular meals with protein at each meal.     Additional Goals?  No       Nutrition Assessments: Nutrition Assessments - 10/17/17 1008      MEDFICTS Scores   Pre Score  21       Nutrition Goals Re-Evaluation:   Nutrition Goals Discharge (Final Nutrition Goals Re-Evaluation):   Psychosocial: Target Goals: Acknowledge presence or absence of significant depression and/or stress, maximize coping skills, provide positive support  system. Participant is able to verbalize types and ability to use techniques and skills needed for reducing stress and depression.  Initial Review & Psychosocial Screening: Initial Psych Review & Screening - 10/17/17 1007      Initial Review   Current issues with  None Identified      Family Dynamics   Good Support System?  Yes      Barriers   Psychosocial barriers to participate in program  There are no identifiable barriers or psychosocial needs.      Screening Interventions   Interventions  Encouraged to exercise    Expected Outcomes  Short Term goal: Identification and review with participant of any Quality of Life or Depression concerns found by scoring the questionnaire.;Long Term goal: The participant improves quality of Life and PHQ9 Scores as seen by post scores and/or verbalization of changes       Quality of Life Scores: Quality of Life - 10/17/17 0858       Quality of Life   Select  Quality of Life      Quality of Life Scores   Health/Function Pre  15.43 %    Socioeconomic Pre  23.17 %    Psych/Spiritual Pre  24 %    Family Pre  19.5 %    GLOBAL Pre  19.27 %      Scores of 19 and below usually indicate a poorer quality of life in these areas.  A difference of  2-3 points is a clinically meaningful difference.  A difference of 2-3 points in the total score of the Quality of Life Index has been associated with significant improvement in overall quality of life, self-image, physical symptoms, and general health in studies assessing change in quality of life.  PHQ-9: Recent Review Flowsheet Data    Depression screen [facility] 2/9 10/17/2017   Decreased Interest 0   Down, Depressed, Hopeless 0   PHQ - 2 Score 0   Altered sleeping 1   Tired, decreased energy 1   Change in appetite 0   Feeling bad or failure about yourself  0   Trouble concentrating 1   Moving slowly or fidgety/restless 0   Suicidal thoughts 0   PHQ-9 Score 3   Difficult doing work/chores Somewhat difficult     Interpretation of Total Score  Total Score Depression Severity:  1-4 = Minimal depression, 5-9 = Mild depression, 10-14 = Moderate depression, 15-19 = Moderately severe depression, 20-27 = Severe depression   Psychosocial Evaluation and Intervention: Psychosocial Evaluation - 10/17/17 1007      Psychosocial Evaluation & Interventions   Interventions  Encouraged to exercise with the program and follow exercise prescription    Continue Psychosocial Services   No Follow up required       Psychosocial Re-Evaluation:   Psychosocial Discharge (Final Psychosocial Re-Evaluation):   Vocational Rehabilitation: Provide vocational rehab assistance to qualifying candidates.   Vocational Rehab Evaluation & Intervention: Vocational Rehab - 10/17/17 1010      Initial Vocational Rehab Evaluation & Intervention   Assessment shows need for Vocational Rehabilitation  No        Education: Education Goals: Education classes will be provided on a weekly basis, covering required topics. Participant will state understanding/return demonstration of topics presented.  Learning Barriers/Preferences: Learning Barriers/Preferences - 10/17/17 1008      Learning Barriers/Preferences   Learning Barriers  None    Learning Preferences  Pictoral;Video;Written Material       Education Topics: Hypertension, Hypertension Reduction -Define heart  disease and high blood pressure. Discus how high blood pressure affects the body and ways to reduce high blood pressure.   Exercise and Your Heart -Discuss why it is important to exercise, the FITT principles of exercise, normal and abnormal responses to exercise, and how to exercise safely.   Angina -Discuss definition of angina, causes of angina, treatment of angina, and how to decrease risk of having angina.   Cardiac Medications -Review what the following cardiac medications are used for, how they affect the body, and side effects that may occur when taking the medications.  Medications include Aspirin, Beta blockers, calcium channel blockers, ACE Inhibitors, angiotensin receptor blockers, diuretics, digoxin, and antihyperlipidemics.   Congestive Heart Failure -Discuss the definition of CHF, how to live with CHF, the signs and symptoms of CHF, and how keep track of weight and sodium intake.   Heart Disease and Intimacy -Discus the effect sexual activity has on the heart, how changes occur during intimacy as we age, and safety during sexual activity.   Smoking Cessation / COPD -Discuss different methods to quit smoking, the health benefits of quitting smoking, and the definition of COPD.   Nutrition I: Fats -Discuss the types of cholesterol, what cholesterol does to the heart, and how cholesterol levels can be controlled.   Nutrition II: Labels -Discuss the different components of food labels and how to read food  label   Heart Parts/Heart Disease and PAD -Discuss the anatomy of the heart, the pathway of blood circulation through the heart, and these are affected by heart disease.   Stress I: Signs and Symptoms -Discuss the causes of stress, how stress may lead to anxiety and depression, and ways to limit stress.   Stress II: Relaxation -Discuss different types of relaxation techniques to limit stress.   Warning Signs of Stroke / TIA -Discuss definition of a stroke, what the signs and symptoms are of a stroke, and how to identify when someone is having stroke.   Knowledge Questionnaire Score: Knowledge Questionnaire Score - 10/17/17 1009      Knowledge Questionnaire Score   Pre Score  24/28       Core Components/Risk Factors/Patient Goals at Admission: Personal Goals and Risk Factors at Admission - 10/17/17 1010      Core Components/Risk Factors/Patient Goals on Admission    Weight Management  Weight Maintenance    Personal Goal Other  Yes    Personal Goal  Be able to function during ADL's and be able to do things I want to do.     Intervention  Attend CR 3 x week and supplement at home exercise 2 x week.     Expected Outcomes  Reach personal goals       Core Components/Risk Factors/Patient Goals Review:    Core Components/Risk Factors/Patient Goals at Discharge (Final Review):    ITP Comments:   Comments: Patient arrived for 1st visit/orientation/education at 0800. Patient was referred to CR by Dr. Harl Bowie due to CABGx2 (Z95.1). During orientation advised patient on arrival and appointment times what to wear, what to do before, during and after exercise. Reviewed attendance and class policy. Talked about inclement weather and class consultation policy. Pt is scheduled to return Cardiac Rehab on 10/24/17 at [facility]. Pt was advised to come to class 15 minutes before class starts. Patient was also given instructions on meeting with the dietician and attending the Family Structure  classes. Discussed RPE/Dpysnea scales. Discussed initial THR and how to find their radial and/or carotid pulse. Discussed  the initial exercise prescription and how this effects their progress. Pt is eager to get started. Patient participated in warm up stretches followed by light weights and resistance bands. Patient was able to complete 6 minute walk test. Patient's O2 Sat dropped below 90% during test. Patient had no pain. Patient was measured for the equipment. Discussed equipment safety with patient. Took patient pre-anthropometric measurements. Patient finished visit at 0930.

## 2017-10-17 NOTE — Progress Notes (Signed)
Cardiac/Pulmonary Rehab Medication Review by a Pharmacist  Does the patient  feel that his/her medications are working for him/her?  yes  Has the patient been experiencing any side effects to the medications prescribed?  no  Does the patient measure his/her own blood pressure or blood glucose at home?  yes   Does the patient have any problems obtaining medications due to transportation or finances?   no  Understanding of regimen: good Understanding of indications: good Potential of compliance: good  Questions asked to Determine Patient Understanding of Medication Regimen:  1. What is the name of the medication?  2. What is the medication used for?  3. When should it be taken?  4. How much should be taken?  5. How will you take it?  6. What side effects should you report?  Understanding Defined as: Excellent: All questions above are correct Good: Questions 1-4 are correct Fair: Questions 1-2 are correct  Poor: 1 or none of the above questions are correct   Pharmacist comments: Overall, patient has a good understanding and high compliance. He does not have too many chronic medications and no questions or issues at this moment.    Ramond Craver 10/17/2017 8:53 AM

## 2017-10-17 NOTE — Progress Notes (Signed)
Daily Session Note  Patient Details  Name: Chase Guerrero MRN: [id number] Date of Birth: 1946/01/29 Referring Provider:    Encounter Date: 10/17/2017  Check In: Session Check In - 10/17/17 0800      Check-In   Supervising physician immediately available to respond to emergencies  See telemetry face sheet for immediately available MD    Location  [facility]    Staff Present  Russella Dar, MS, EP, [facility], Exercise Physiologist;Debra Wynetta Emery, RN, BSN;Other    Medication changes reported      No    Fall or balance concerns reported     No    Tobacco Cessation  --   Quit 1989   Warm-up and Cool-down  Performed as group-led instruction    Resistance Training Performed  Yes    VAD Patient?  No    PAD/SET Patient?  No      Pain Assessment   Currently in Pain?  No/denies    Pain Score  0-No pain    Multiple Pain Sites  No       Capillary Blood Glucose: No results found for this or any previous visit (from the past 24 hour(s)).    Social History   Tobacco Use  Smoking Status Former Smoker  . Packs/day: 0.50  . Years: 30.00  . Pack years: 15.00  . Types: Cigarettes  Smokeless Tobacco Former Systems developer  . Types: Snuff  Tobacco Comment   USING NICORETTE GUM TO TRY AND STOP USING SMOKELESS TOBACCO    Goals Met:  Independence with exercise equipment Exercise tolerated well Personal goals reviewed No report of cardiac concerns or symptoms Strength training completed today  Goals Unmet:  Not Applicable  Comments: Check out: 0930   Dr. Kate Sable is Medical Director for [facility] Cardiac and Pulmonary Rehab.

## 2017-10-18 ENCOUNTER — Other Ambulatory Visit: Payer: Self-pay | Admitting: Cardiothoracic Surgery

## 2017-10-18 DIAGNOSIS — Z951 Presence of aortocoronary bypass graft: Secondary | ICD-10-CM

## 2017-10-18 NOTE — Progress Notes (Signed)
c 

## 2017-10-19 ENCOUNTER — Ambulatory Visit
Admission: RE | Admit: 2017-10-19 | Discharge: 2017-10-19 | Disposition: A | Discharge status: Home / Self Care | Payer: PPO | Source: Ambulatory Visit | Attending: Cardiothoracic Surgery | Admitting: Cardiothoracic Surgery

## 2017-10-19 ENCOUNTER — Encounter (INDEPENDENT_AMBULATORY_CARE_PROVIDER_SITE_OTHER): Payer: PPO

## 2017-10-19 DIAGNOSIS — Z4802 Encounter for removal of sutures: Secondary | ICD-10-CM | POA: Diagnosis not present

## 2017-10-19 DIAGNOSIS — Z951 Presence of aortocoronary bypass graft: Secondary | ICD-10-CM | POA: Diagnosis not present

## 2017-10-24 ENCOUNTER — Encounter (HOSPITAL_COMMUNITY)
Admission: RE | Admit: 2017-10-24 | Discharge: 2017-10-24 | Disposition: A | Discharge status: Home / Self Care | Payer: PPO | Source: Ambulatory Visit | Attending: Cardiology | Admitting: Cardiology

## 2017-10-24 DIAGNOSIS — Z951 Presence of aortocoronary bypass graft: Secondary | ICD-10-CM | POA: Diagnosis not present

## 2017-10-24 NOTE — Progress Notes (Signed)
Daily Session Note  Patient Details  Name: Chase Guerrero MRN: [id number] Date of Birth: 06/27/1945 Referring Provider:    Encounter Date: 10/24/2017  Check In: Session Check In - 10/24/17 0842      Check-In   Supervising physician immediately available to respond to emergencies  See telemetry face sheet for immediately available MD    Location  [facility]    Staff Present  Diane Coad, MS, EP, [facility], Exercise Physiologist;Amanda Ballard, Exercise Physiologist    Medication changes reported      No    Fall or balance concerns reported     No    Warm-up and Cool-down  Performed as group-led instruction    Resistance Training Performed  Yes    VAD Patient?  No    PAD/SET Patient?  No      Pain Assessment   Currently in Pain?  No/denies    Pain Score  0-No pain    Multiple Pain Sites  No       Capillary Blood Glucose: No results found for this or any previous visit (from the past 24 hour(s)).    Social History   Tobacco Use  Smoking Status Former Smoker  . Packs/day: 0.50  . Years: 30.00  . Pack years: 15.00  . Types: Cigarettes  Smokeless Tobacco Former User  . Types: Snuff  Tobacco Comment   USING NICORETTE GUM TO TRY AND STOP USING SMOKELESS TOBACCO    Goals Met:  Exercise tolerated well Personal goals reviewed No report of cardiac concerns or symptoms Strength training completed today  Goals Unmet:  Not Applicable  Comments: Pt able to follow exercise prescription today without complaint.  Will continue to monitor for progression. Check out 9:15.   Dr. Suresh Koneswaran is Medical Director for [place] [facility] and [facility]. 

## 2017-10-26 ENCOUNTER — Encounter (HOSPITAL_COMMUNITY)
Admission: RE | Admit: 2017-10-26 | Discharge: 2017-10-26 | Disposition: A | Discharge status: Home / Self Care | Payer: PPO | Source: Ambulatory Visit | Attending: Cardiology | Admitting: Cardiology

## 2017-10-26 DIAGNOSIS — Z951 Presence of aortocoronary bypass graft: Secondary | ICD-10-CM

## 2017-10-26 NOTE — Progress Notes (Signed)
Daily Session Note  Patient Details  Name: Chase Guerrero MRN: [id number] Date of Birth: Nov 01, 1945 Referring Provider:    Encounter Date: 10/26/2017  Check In: Session Check In - 10/26/17 0815      Check-In   Supervising physician immediately available to respond to emergencies  See telemetry face sheet for immediately available MD    Location  [facility]    Staff Present  Russella Dar, MS, EP, [facility], Exercise Physiologist;Pippa Hanif Zachery Conch, Exercise Physiologist;Debra Wynetta Emery, RN, BSN    Medication changes reported      No    Fall or balance concerns reported     No    Warm-up and Cool-down  Performed as group-led Higher education careers adviser Performed  Yes    VAD Patient?  No    PAD/SET Patient?  No      Pain Assessment   Currently in Pain?  No/denies    Pain Score  0-No pain    Multiple Pain Sites  No       Capillary Blood Glucose: No results found for this or any previous visit (from the past 24 hour(s)).    Social History   Tobacco Use  Smoking Status Former Smoker  . Packs/day: 0.50  . Years: 30.00  . Pack years: 15.00  . Types: Cigarettes  Smokeless Tobacco Former Systems developer  . Types: Snuff  Tobacco Comment   USING NICORETTE GUM TO TRY AND STOP USING SMOKELESS TOBACCO    Goals Met:  Independence with exercise equipment Exercise tolerated well No report of cardiac concerns or symptoms Strength training completed today  Goals Unmet:  Not Applicable  Comments: Pt able to follow exercise prescription today without complaint.  Will continue to monitor for progression. Check out 9:15.   Dr. Kate Sable is Medical Director for [facility] Cardiac and Pulmonary Rehab.

## 2017-10-26 NOTE — Progress Notes (Signed)
Cardiac Individual Treatment Plan  Patient Details  Name: Chase Guerrero MRN: [id number] Date of Birth: 1946/01/22 Referring Provider:    Initial Encounter Date:   Visit Diagnosis: S/P CABG x 2  Patient's Home Medications on Admission:  Current Outpatient Medications:  .  acetaminophen (TYLENOL) 500 MG tablet, Take 2 tablets (1,000 mg total) by mouth every 6 (six) hours as needed for mild pain or fever., Disp: 30 tablet, Rfl: 0 .  aspirin EC 81 MG tablet, Take 81 mg by mouth daily., Disp: , Rfl:  .  atorvastatin (LIPITOR) 80 MG tablet, Take 1 tablet (80 mg total) by mouth daily., Disp: 90 tablet, Rfl: 3 .  cetirizine (ZYRTEC) 10 MG tablet, Take 10 mg by mouth daily as needed for allergies., Disp: , Rfl:  .  finasteride (PROSCAR) 5 MG tablet, Take 5 mg by mouth daily., Disp: , Rfl:  .  fluticasone (FLONASE) 50 MCG/ACT nasal spray, Place 1 spray into both nostrils daily as needed for allergies or rhinitis., Disp: , Rfl:  .  furosemide (LASIX) 20 MG tablet, Take 1 tablet (20 mg total) by mouth daily as needed. Take one tab by mouth daily x 3 days, then only as needed for swelling or edema (Patient taking differently: Take 20 mg by mouth every Monday, Wednesday, and Friday. Take one tab by mouth daily x 3 days, then only as needed for swelling or edema), Disp: 90 tablet, Rfl: 0 .  Menthol, Topical Analgesic, (BIOFREEZE EX), Apply 1 application topically daily as needed (pain)., Disp: , Rfl:  .  metoprolol tartrate (LOPRESSOR) 25 MG tablet, Take 12.5 mg by mouth 2 (two) times daily. , Disp: , Rfl:  .  nitroGLYCERIN (NITROSTAT) 0.4 MG SL tablet, Place 0.4 mg under the tongue every 5 (five) minutes as needed for chest pain. , Disp: , Rfl:  .  potassium chloride SA (K-DUR,KLOR-CON) 20 MEQ tablet, Take 1 tablet (20 mEq total) by mouth daily as needed (only on the day you take the as needed Lasix). (Patient taking differently: Take 20 mEq by mouth every Monday, Wednesday, and Friday. ), Disp: 90 tablet,  Rfl: 0 .  ranitidine (ZANTAC) 150 MG tablet, Take 150 mg by mouth daily as needed for heartburn., Disp: , Rfl:  .  Soft Lens Products (SENSITIVE EYES SALINE) SOLN, Place 1 drop into both eyes 3 (three) times daily., Disp: , Rfl:   Past Medical History: Past Medical History:  Diagnosis Date  . Arthritis   . Atrial fibrillation ([place])   . Cancer ([place])    skin  . Coronary artery disease   . Enlarged prostate   . GERD (gastroesophageal reflux disease)   . Hypertension   . Myocardial infarction ([facility])    mild  . Pleural effusion   . Sleep apnea    not wearing  cpap  not worn in 9-10 yrs    Tobacco Use: Social History   Tobacco Use  Smoking Status Former Smoker  . Packs/day: 0.50  . Years: 30.00  . Pack years: 15.00  . Types: Cigarettes  Smokeless Tobacco Former Systems developer  . Types: Snuff  Tobacco Comment   USING NICORETTE GUM TO TRY AND STOP USING SMOKELESS TOBACCO    Labs: Recent Review Flowsheet Data    Labs for [facility] Cardiac and Pulmonary Rehab Latest Ref Rng & Units 06/27/2017 06/27/2017 06/28/2017 06/29/2017 09/20/2017   Cholestrol 100 - 199 mg/dL - - - - 121   LDLCALC 0 - 99 mg/dL - - - - 61  HDL >39 mg/dL - - - - 43   Trlycerides 0 - 149 mg/dL - - - - 85   Hemoglobin A1c 4.8 - 5.6 % - - - - -   PHART 7.350 - 7.450 - 7.350 - - -   PCO2ART 32.0 - 48.0 mmHg - 39.9 - - -   HCO3 20.0 - 28.0 mmol/L - 22.0 - - -   TCO2 22 - 32 mmol/L 24 23 24 24  -   ACIDBASEDEF 0.0 - 2.0 mmol/L - 3.0(H) - - -   O2SAT % - 99.0 - - -      Capillary Blood Glucose: Lab Results  Component Value Date   GLUCAP 96 07/01/2017   GLUCAP 124 (H) 06/30/2017   GLUCAP 111 (H) 06/30/2017   GLUCAP 116 (H) 06/30/2017   GLUCAP 122 (H) 06/30/2017     Exercise Target Goals: Exercise Program Goal: Individual exercise prescription set using results from initial 6 min walk test and THRR while considering  patient's activity barriers and safety.   Exercise Prescription Goal: Starting with aerobic activity  30 plus minutes a day, 3 days per week for initial exercise prescription. Provide home exercise prescription and guidelines that participant acknowledges understanding prior to discharge.  Activity Barriers & Risk Stratification: Activity Barriers & Cardiac Risk Stratification - 10/17/17 0953      Activity Barriers & Cardiac Risk Stratification   Activity Barriers  Back Problems   shoulders worn out   Cardiac Risk Stratification  High       6 Minute Walk: 6 Minute Walk    Row Name 10/17/17 0855         6 Minute Walk   Phase  Initial     Distance  1300 feet     Walk Time  6 minutes     # of Rest Breaks  0     MPH  2.46     METS  2.89     RPE  9     Perceived Dyspnea   7     VO2 Peak  9.27     Resting HR  63 bpm     Resting BP  110/50     Resting Oxygen Saturation   94 %     Exercise Oxygen Saturation  during 6 min walk  87 %     Max Ex. HR  87 bpm     Max Ex. BP  128/58     2 Minute Post BP  116/58        Oxygen Initial Assessment:   Oxygen Re-Evaluation:   Oxygen Discharge (Final Oxygen Re-Evaluation):   Initial Exercise Prescription:   Perform Capillary Blood Glucose checks as needed.  Exercise Prescription Changes:   Exercise Comments:  Exercise Comments    Row Name 10/24/17 1618           Exercise Comments  Patient has just starter. Has done well his first few visits.           Exercise Goals and Review:  Exercise Goals    Row Name 10/17/17 0954             Exercise Goals   Increase Physical Activity  Yes       Intervention  Provide advice, education, support and counseling about physical activity/exercise needs.       Expected Outcomes  Short Term: Attend rehab on a regular basis to increase amount of physical activity.;Long Term: Add in home exercise to make exercise  part of routine and to increase amount of physical activity.;Long Term: Exercising regularly at least 3-5 days a week.       Increase Strength and Stamina  Yes        Intervention  Provide advice, education, support and counseling about physical activity/exercise needs.       Expected Outcomes  Short Term: Increase workloads from initial exercise prescription for resistance, speed, and METs.;Long Term: Improve cardiorespiratory fitness, muscular endurance and strength as measured by increased METs and functional capacity (6MWT)       Able to understand and use rate of perceived exertion (RPE) scale  Yes       Intervention  Provide education and explanation on how to use RPE scale       Expected Outcomes  Short Term: Able to use RPE daily in rehab to express subjective intensity level;Long Term:  Able to use RPE to guide intensity level when exercising independently       Knowledge and understanding of Target Heart Rate Range (THRR)  Yes       Intervention  Provide education and explanation of THRR including how the numbers were predicted and where they are located for reference       Expected Outcomes  Short Term: Able to use daily as guideline for intensity in rehab;Long Term: Able to use THRR to govern intensity when exercising independently       Able to check pulse independently  Yes       Intervention  Provide education and demonstration on how to check pulse in carotid and radial arteries.       Expected Outcomes  Short Term: Able to explain why pulse checking is important during independent exercise;Long Term: Able to check pulse independently and accurately       Understanding of Exercise Prescription  Yes       Intervention  Provide education, explanation, and written materials on patient's individual exercise prescription       Expected Outcomes  Short Term: Able to explain program exercise prescription;Long Term: Able to explain home exercise prescription to exercise independently          Exercise Goals Re-Evaluation : Exercise Goals Re-Evaluation    [place] Name 10/24/17 1614             Exercise Goal Re-Evaluation   Exercise Goals Review   Increase Physical Activity;Increase Strength and Stamina;Understanding of Exercise Prescription;Knowledge and understanding of Target Heart Rate Range (THRR)       Comments  Patient just started the program. He completed 2 visit. Will continue to monitor his progress       Expected Outcomes  To increase in functional capicity and ADL's. Wants to get back to doing the things he wants to do.            Discharge Exercise Prescription (Final Exercise Prescription Changes):   Nutrition:  Target Goals: Understanding of nutrition guidelines, daily intake of sodium 1500mg , cholesterol 200mg , calories 30% from fat and 7% or less from saturated fats, daily to have 5 or more servings of fruits and vegetables.  Biometrics: Pre Biometrics - 10/17/17 0857      Pre Biometrics   Height  5\' 10"  (1.778 m)    Waist Circumference  40.5 inches    Hip Circumference  36 inches    Waist to Hip Ratio  1.12 %    Triceps Skinfold  4 mm    % Body Fat  22.4 %    Grip  Strength  34.9 kg    Flexibility  0 in   bulging discs in back    Single Leg Stand  8 seconds        Nutrition Therapy Plan and Nutrition Goals: Nutrition Therapy & Goals - 10/17/17 1007      Personal Nutrition Goals   Personal Goal #2  Patient is easting heart healthy. Doctors want him to eat regular meals with protein at each meal.     Additional Goals?  No       Nutrition Assessments: Nutrition Assessments - 10/17/17 1008      MEDFICTS Scores   Pre Score  21       Nutrition Goals Re-Evaluation:   Nutrition Goals Discharge (Final Nutrition Goals Re-Evaluation):   Psychosocial: Target Goals: Acknowledge presence or absence of significant depression and/or stress, maximize coping skills, provide positive support system. Participant is able to verbalize types and ability to use techniques and skills needed for reducing stress and depression.  Initial Review & Psychosocial Screening: Initial Psych Review & Screening -  10/17/17 1007      Initial Review   Current issues with  None Identified      Family Dynamics   Good Support System?  Yes      Barriers   Psychosocial barriers to participate in program  There are no identifiable barriers or psychosocial needs.      Screening Interventions   Interventions  Encouraged to exercise    Expected Outcomes  Short Term goal: Identification and review with participant of any Quality of Life or Depression concerns found by scoring the questionnaire.;Long Term goal: The participant improves quality of Life and PHQ9 Scores as seen by post scores and/or verbalization of changes       Quality of Life Scores: Quality of Life - 10/17/17 0858      Quality of Life   Select  Quality of Life      Quality of Life Scores   Health/Function Pre  15.43 %    Socioeconomic Pre  23.17 %    Psych/Spiritual Pre  24 %    Family Pre  19.5 %    GLOBAL Pre  19.27 %      Scores of 19 and below usually indicate a poorer quality of life in these areas.  A difference of  2-3 points is a clinically meaningful difference.  A difference of 2-3 points in the total score of the Quality of Life Index has been associated with significant improvement in overall quality of life, self-image, physical symptoms, and general health in studies assessing change in quality of life.  PHQ-9: Recent Review Flowsheet Data    Depression screen [facility] 2/9 10/17/2017   Decreased Interest 0   Down, Depressed, Hopeless 0   PHQ - 2 Score 0   Altered sleeping 1   Tired, decreased energy 1   Change in appetite 0   Feeling bad or failure about yourself  0   Trouble concentrating 1   Moving slowly or fidgety/restless 0   Suicidal thoughts 0   PHQ-9 Score 3   Difficult doing work/chores Somewhat difficult     Interpretation of Total Score  Total Score Depression Severity:  1-4 = Minimal depression, 5-9 = Mild depression, 10-14 = Moderate depression, 15-19 = Moderately severe depression, 20-27 = Severe  depression   Psychosocial Evaluation and Intervention: Psychosocial Evaluation - 10/17/17 1007      Psychosocial Evaluation & Interventions   Interventions  Encouraged to exercise  with the program and follow exercise prescription    Continue Psychosocial Services   No Follow up required       Psychosocial Re-Evaluation:   Psychosocial Discharge (Final Psychosocial Re-Evaluation):   Vocational Rehabilitation: Provide vocational rehab assistance to qualifying candidates.   Vocational Rehab Evaluation & Intervention: Vocational Rehab - 10/17/17 1010      Initial Vocational Rehab Evaluation & Intervention   Assessment shows need for Vocational Rehabilitation  No       Education: Education Goals: Education classes will be provided on a weekly basis, covering required topics. Participant will state understanding/return demonstration of topics presented.  Learning Barriers/Preferences: Learning Barriers/Preferences - 10/17/17 1008      Learning Barriers/Preferences   Learning Barriers  None    Learning Preferences  Pictoral;Video;Written Material       Education Topics: Hypertension, Hypertension Reduction -Define heart disease and high blood pressure. Discus how high blood pressure affects the body and ways to reduce high blood pressure.   Exercise and Your Heart -Discuss why it is important to exercise, the FITT principles of exercise, normal and abnormal responses to exercise, and how to exercise safely.   Angina -Discuss definition of angina, causes of angina, treatment of angina, and how to decrease risk of having angina.   Cardiac Medications -Review what the following cardiac medications are used for, how they affect the body, and side effects that may occur when taking the medications.  Medications include Aspirin, Beta blockers, calcium channel blockers, ACE Inhibitors, angiotensin receptor blockers, diuretics, digoxin, and antihyperlipidemics.   Congestive  Heart Failure -Discuss the definition of CHF, how to live with CHF, the signs and symptoms of CHF, and how keep track of weight and sodium intake.   Heart Disease and Intimacy -Discus the effect sexual activity has on the heart, how changes occur during intimacy as we age, and safety during sexual activity.   Smoking Cessation / COPD -Discuss different methods to quit smoking, the health benefits of quitting smoking, and the definition of COPD.   Nutrition I: Fats -Discuss the types of cholesterol, what cholesterol does to the heart, and how cholesterol levels can be controlled.   Nutrition II: Labels -Discuss the different components of food labels and how to read food label   Heart Parts/Heart Disease and PAD -Discuss the anatomy of the heart, the pathway of blood circulation through the heart, and these are affected by heart disease.   Stress I: Signs and Symptoms -Discuss the causes of stress, how stress may lead to anxiety and depression, and ways to limit stress.   Stress II: Relaxation -Discuss different types of relaxation techniques to limit stress.   Warning Signs of Stroke / TIA -Discuss definition of a stroke, what the signs and symptoms are of a stroke, and how to identify when someone is having stroke.   Knowledge Questionnaire Score: Knowledge Questionnaire Score - 10/17/17 1009      Knowledge Questionnaire Score   Pre Score  24/28       Core Components/Risk Factors/Patient Goals at Admission: Personal Goals and Risk Factors at Admission - 10/17/17 1010      Core Components/Risk Factors/Patient Goals on Admission    Weight Management  Weight Maintenance    Personal Goal Other  Yes    Personal Goal  Be able to function during ADL's and be able to do things I want to do.     Intervention  Attend CR 3 x week and supplement at home exercise 2 x  week.     Expected Outcomes  Reach personal goals       Core Components/Risk Factors/Patient Goals Review:     Core Components/Risk Factors/Patient Goals at Discharge (Final Review):    ITP Comments: ITP Comments    Row Name 10/26/17 0746           ITP Comments  Patient is new to program. He has completed 2 sessions. Will continue to monitor for progress.           Comments: ITP 30 Day REVIEW Patient is new to program. He has completed 2 sessions. Will continue to monitor for progress.

## 2017-10-27 ENCOUNTER — Encounter (HOSPITAL_COMMUNITY): Payer: PPO

## 2017-10-28 ENCOUNTER — Encounter (HOSPITAL_COMMUNITY)
Admission: RE | Admit: 2017-10-28 | Discharge: 2017-10-28 | Disposition: A | Discharge status: Home / Self Care | Payer: PPO | Source: Ambulatory Visit | Attending: Cardiology | Admitting: Cardiology

## 2017-10-28 DIAGNOSIS — Z951 Presence of aortocoronary bypass graft: Secondary | ICD-10-CM | POA: Diagnosis not present

## 2017-10-28 NOTE — Progress Notes (Signed)
Daily Session Note  Patient Details  Name: MING MCMANNIS MRN: [id number] Date of Birth: 10-24-1945 Referring Provider:    Encounter Date: 10/28/2017  Check In: Session Check In - 10/28/17 0815      Check-In   Supervising physician immediately available to respond to emergencies  See telemetry face sheet for immediately available MD    Location  [facility]    Staff Present  Russella Dar, MS, EP, [facility], Exercise Physiologist;Demeka Sutter Zachery Conch, Exercise Physiologist;Debra Wynetta Emery, RN, BSN    Medication changes reported      No    Fall or balance concerns reported     No    Warm-up and Cool-down  Performed as group-led Higher education careers adviser Performed  Yes    VAD Patient?  No    PAD/SET Patient?  No      Pain Assessment   Currently in Pain?  No/denies    Pain Score  0-No pain    Multiple Pain Sites  No       Capillary Blood Glucose: No results found for this or any previous visit (from the past 24 hour(s)).    Social History   Tobacco Use  Smoking Status Former Smoker  . Packs/day: 0.50  . Years: 30.00  . Pack years: 15.00  . Types: Cigarettes  Smokeless Tobacco Former Systems developer  . Types: Snuff  Tobacco Comment   USING NICORETTE GUM TO TRY AND STOP USING SMOKELESS TOBACCO    Goals Met:  Independence with exercise equipment Exercise tolerated well No report of cardiac concerns or symptoms Strength training completed today  Goals Unmet:  Not Applicable  Comments: Pt able to follow exercise prescription today without complaint.  Will continue to monitor for progression. Check out 9:15.    Dr. Kate Sable is Medical Director for [facility] Cardiac and Pulmonary Rehab.

## 2017-10-31 ENCOUNTER — Encounter (HOSPITAL_COMMUNITY)
Admission: RE | Admit: 2017-10-31 | Discharge: 2017-10-31 | Disposition: A | Discharge status: Home / Self Care | Payer: PPO | Source: Ambulatory Visit | Attending: Cardiology | Admitting: Cardiology

## 2017-10-31 DIAGNOSIS — Z951 Presence of aortocoronary bypass graft: Secondary | ICD-10-CM

## 2017-10-31 NOTE — Progress Notes (Signed)
Daily Session Note  Patient Details  Name: Chase Guerrero MRN: [id number] Date of Birth: [birth year] Referring Provider:    Encounter Date: 10/31/2017  Check In: Session Check In - 10/31/17 0815      Check-In   Supervising physician immediately available to respond to emergencies  See telemetry face sheet for immediately available MD    Location  [facility]    Staff Present  Russella Dar, MS, EP, [facility], Exercise Physiologist;Auden Tatar Zachery Conch, Exercise Physiologist;Debra Wynetta Emery, RN, BSN    Medication changes reported      No    Fall or balance concerns reported     No    Warm-up and Cool-down  Performed as group-led Higher education careers adviser Performed  Yes    VAD Patient?  No    PAD/SET Patient?  No      Pain Assessment   Currently in Pain?  No/denies    Pain Score  0-No pain    Multiple Pain Sites  No       Capillary Blood Glucose: No results found for this or any previous visit (from the past 24 hour(s)).    Social History   Tobacco Use  Smoking Status Former Smoker  . Packs/day: 0.50  . Years: 30.00  . Pack years: 15.00  . Types: Cigarettes  Smokeless Tobacco Former Systems developer  . Types: Snuff  Tobacco Comment   USING NICORETTE GUM TO TRY AND STOP USING SMOKELESS TOBACCO    Goals Met:  Independence with exercise equipment Exercise tolerated well No report of cardiac concerns or symptoms Strength training completed today  Goals Unmet:  Not Applicable  Comments: Pt able to follow exercise prescription today without complaint.  Will continue to monitor for progression. Check out 9:15.   Dr. Kate Sable is Medical Director for [facility] Cardiac and Pulmonary Rehab.

## 2017-11-02 ENCOUNTER — Ambulatory Visit: Payer: Self-pay | Admitting: Cardiothoracic Surgery

## 2017-11-02 ENCOUNTER — Encounter (HOSPITAL_COMMUNITY): Admission: RE | Admit: 2017-11-02 | Payer: PPO | Source: Ambulatory Visit

## 2017-11-03 DIAGNOSIS — R972 Elevated prostate specific antigen [PSA]: Secondary | ICD-10-CM | POA: Diagnosis not present

## 2017-11-03 DIAGNOSIS — N401 Enlarged prostate with lower urinary tract symptoms: Secondary | ICD-10-CM | POA: Diagnosis not present

## 2017-11-04 ENCOUNTER — Encounter (HOSPITAL_COMMUNITY)
Admission: RE | Admit: 2017-11-04 | Discharge: 2017-11-04 | Disposition: A | Discharge status: Home / Self Care | Payer: PPO | Source: Ambulatory Visit | Attending: Cardiology | Admitting: Cardiology

## 2017-11-04 DIAGNOSIS — Z951 Presence of aortocoronary bypass graft: Secondary | ICD-10-CM | POA: Diagnosis not present

## 2017-11-04 NOTE — Progress Notes (Signed)
Daily Session Note  Patient Details  Name: Chase Guerrero MRN: [id number] Date of Birth: 12/01/1945 Referring Provider:    Encounter Date: 11/04/2017  Check In: Session Check In - 11/04/17 0815      Check-In   Supervising physician immediately available to respond to emergencies  See telemetry face sheet for immediately available MD    Location  [facility]    Staff Present  Benay Pike, Exercise Physiologist;Debra Wynetta Emery, RN, BSN    Medication changes reported      No    Fall or balance concerns reported     No    Warm-up and Cool-down  Performed as group-led instruction    Resistance Training Performed  Yes    VAD Patient?  No    PAD/SET Patient?  No      Pain Assessment   Currently in Pain?  No/denies    Pain Score  0-No pain    Multiple Pain Sites  No       Capillary Blood Glucose: No results found for this or any previous visit (from the past 24 hour(s)).    Social History   Tobacco Use  Smoking Status Former Smoker  . Packs/day: 0.50  . Years: 30.00  . Pack years: 15.00  . Types: Cigarettes  Smokeless Tobacco Former Systems developer  . Types: Snuff  Tobacco Comment   USING NICORETTE GUM TO TRY AND STOP USING SMOKELESS TOBACCO    Goals Met:  Independence with exercise equipment Exercise tolerated well No report of cardiac concerns or symptoms Strength training completed today  Goals Unmet:  Not Applicable  Comments: Pt able to follow exercise prescription today without complaint.  Will continue to monitor for progression. Check out 9:15.   Dr. Kate Sable is Medical Director for [facility] Cardiac and Pulmonary Rehab.

## 2017-11-07 ENCOUNTER — Encounter (HOSPITAL_COMMUNITY): Payer: PPO

## 2017-11-07 DIAGNOSIS — N401 Enlarged prostate with lower urinary tract symptoms: Secondary | ICD-10-CM | POA: Diagnosis not present

## 2017-11-09 ENCOUNTER — Encounter (HOSPITAL_COMMUNITY)
Admission: RE | Admit: 2017-11-09 | Discharge: 2017-11-09 | Disposition: A | Discharge status: Home / Self Care | Payer: PPO | Source: Ambulatory Visit | Attending: Cardiology | Admitting: Cardiology

## 2017-11-09 DIAGNOSIS — Z951 Presence of aortocoronary bypass graft: Secondary | ICD-10-CM | POA: Diagnosis not present

## 2017-11-09 NOTE — Progress Notes (Signed)
Daily Session Note  Patient Details  Name: Chase Guerrero MRN: [id number] Date of Birth: [birth year] Referring Provider:    Encounter Date: 11/09/2017  Check In: Session Check In - 11/09/17 0815      Check-In   Supervising physician immediately available to respond to emergencies  See telemetry face sheet for immediately available MD    Location  [facility]    Staff Present  Benay Pike, Exercise Physiologist;Tniyah Nakagawa Wynetta Emery, RN, BSN    Medication changes reported      No    Fall or balance concerns reported     No    Warm-up and Cool-down  Performed as group-led instruction    Resistance Training Performed  Yes    VAD Patient?  No    PAD/SET Patient?  No      Pain Assessment   Currently in Pain?  No/denies    Pain Score  0-No pain    Multiple Pain Sites  No       Capillary Blood Glucose: No results found for this or any previous visit (from the past 24 hour(s)).    Social History   Tobacco Use  Smoking Status Former Smoker  . Packs/day: 0.50  . Years: 30.00  . Pack years: 15.00  . Types: Cigarettes  Smokeless Tobacco Former Systems developer  . Types: Snuff  Tobacco Comment   USING NICORETTE GUM TO TRY AND STOP USING SMOKELESS TOBACCO    Goals Met:  Independence with exercise equipment Exercise tolerated well No report of cardiac concerns or symptoms Strength training completed today  Goals Unmet:  Not Applicable  Comments: Pt able to follow exercise prescription today without complaint.  Will continue to monitor for progression. Check out 915.   Dr. Kate Sable is Medical Director for [facility] Cardiac and Pulmonary Rehab.

## 2017-11-11 ENCOUNTER — Encounter (HOSPITAL_COMMUNITY): Payer: PPO

## 2017-11-14 ENCOUNTER — Encounter (HOSPITAL_COMMUNITY)
Admission: RE | Admit: 2017-11-14 | Discharge: 2017-11-14 | Disposition: A | Discharge status: Home / Self Care | Payer: PPO | Source: Ambulatory Visit | Attending: Cardiology | Admitting: Cardiology

## 2017-11-14 DIAGNOSIS — Z951 Presence of aortocoronary bypass graft: Secondary | ICD-10-CM

## 2017-11-14 NOTE — Progress Notes (Signed)
Daily Session Note  Patient Details  Name: Chase Guerrero MRN: [id number] Date of Birth: 1945-07-11 Referring Provider:    Encounter Date: 11/14/2017  Check In: Session Check In - 11/14/17 0830      Check-In   Supervising physician immediately available to respond to emergencies  See telemetry face sheet for immediately available MD    Location  [facility]    Staff Present  Benay Pike, Exercise Physiologist;Debra Wynetta Emery, RN, BSN    Medication changes reported      No    Fall or balance concerns reported     No    Warm-up and Cool-down  Performed as group-led instruction    Resistance Training Performed  Yes    VAD Patient?  No    PAD/SET Patient?  No      Pain Assessment   Currently in Pain?  No/denies    Pain Score  0-No pain    Multiple Pain Sites  No       Capillary Blood Glucose: No results found for this or any previous visit (from the past 24 hour(s)).    Social History   Tobacco Use  Smoking Status Former Smoker  . Packs/day: 0.50  . Years: 30.00  . Pack years: 15.00  . Types: Cigarettes  Smokeless Tobacco Former Systems developer  . Types: Snuff  Tobacco Comment   USING NICORETTE GUM TO TRY AND STOP USING SMOKELESS TOBACCO    Goals Met:  Independence with exercise equipment Exercise tolerated well No report of cardiac concerns or symptoms Strength training completed today  Goals Unmet:  Not Applicable  Comments: Pt able to follow exercise prescription today without complaint.  Will continue to monitor for progression. Check out 9:15.   Dr. Kate Sable is Medical Director for [facility] Cardiac and Pulmonary Rehab.

## 2017-11-16 ENCOUNTER — Encounter (HOSPITAL_COMMUNITY)
Admission: RE | Admit: 2017-11-16 | Discharge: 2017-11-16 | Disposition: A | Discharge status: Home / Self Care | Payer: PPO | Source: Ambulatory Visit | Attending: Cardiology | Admitting: Cardiology

## 2017-11-16 DIAGNOSIS — Z951 Presence of aortocoronary bypass graft: Secondary | ICD-10-CM

## 2017-11-16 NOTE — Progress Notes (Signed)
Daily Session Note  Patient Details  Name: Chase Guerrero MRN: [id number] Date of Birth: 05-07-1945 Referring Provider:    Encounter Date: 11/16/2017  Check In: Session Check In - 11/16/17 0815      Check-In   Supervising physician immediately available to respond to emergencies  See telemetry face sheet for immediately available MD    Location  [facility]    Staff Present  Benay Pike, Exercise Physiologist;Debra Wynetta Emery, RN, BSN    Medication changes reported      No    Fall or balance concerns reported     No    Warm-up and Cool-down  Performed as group-led instruction    Resistance Training Performed  Yes    VAD Patient?  No    PAD/SET Patient?  No      Pain Assessment   Currently in Pain?  No/denies    Pain Score  0-No pain    Multiple Pain Sites  No       Capillary Blood Glucose: No results found for this or any previous visit (from the past 24 hour(s)).    Social History   Tobacco Use  Smoking Status Former Smoker  . Packs/day: 0.50  . Years: 30.00  . Pack years: 15.00  . Types: Cigarettes  Smokeless Tobacco Former Systems developer  . Types: Snuff  Tobacco Comment   USING NICORETTE GUM TO TRY AND STOP USING SMOKELESS TOBACCO    Goals Met:  Independence with exercise equipment Exercise tolerated well No report of cardiac concerns or symptoms Strength training completed today  Goals Unmet:  Not Applicable  Comments: Pt able to follow exercise prescription today without complaint.  Will continue to monitor for progression. Check out 9:15.   Dr. Kate Sable is Medical Director for [facility] Card[facility] and Pulmonary Rehab.

## 2017-11-17 NOTE — Progress Notes (Signed)
Cardiac Individual Treatment Plan  Patient Details  Name: Chase Guerrero MRN: [id number] Date of Birth: 1946/01/22 Referring Provider:    Initial Encounter Date:   Visit Diagnosis: S/P CABG x 2  Patient's Home Medications on Admission:  Current Outpatient Medications:  .  acetaminophen (TYLENOL) 500 MG tablet, Take 2 tablets (1,000 mg total) by mouth every 6 (six) hours as needed for mild pain or fever., Disp: 30 tablet, Rfl: 0 .  aspirin EC 81 MG tablet, Take 81 mg by mouth daily., Disp: , Rfl:  .  atorvastatin (LIPITOR) 80 MG tablet, Take 1 tablet (80 mg total) by mouth daily., Disp: 90 tablet, Rfl: 3 .  cetirizine (ZYRTEC) 10 MG tablet, Take 10 mg by mouth daily as needed for allergies., Disp: , Rfl:  .  finasteride (PROSCAR) 5 MG tablet, Take 5 mg by mouth daily., Disp: , Rfl:  .  fluticasone (FLONASE) 50 MCG/ACT nasal spray, Place 1 spray into both nostrils daily as needed for allergies or rhinitis., Disp: , Rfl:  .  furosemide (LASIX) 20 MG tablet, Take 1 tablet (20 mg total) by mouth daily as needed. Take one tab by mouth daily x 3 days, then only as needed for swelling or edema (Patient taking differently: Take 20 mg by mouth every Monday, Wednesday, and Friday. Take one tab by mouth daily x 3 days, then only as needed for swelling or edema), Disp: 90 tablet, Rfl: 0 .  Menthol, Topical Analgesic, (BIOFREEZE EX), Apply 1 application topically daily as needed (pain)., Disp: , Rfl:  .  metoprolol tartrate (LOPRESSOR) 25 MG tablet, Take 12.5 mg by mouth 2 (two) times daily. , Disp: , Rfl:  .  nitroGLYCERIN (NITROSTAT) 0.4 MG SL tablet, Place 0.4 mg under the tongue every 5 (five) minutes as needed for chest pain. , Disp: , Rfl:  .  potassium chloride SA (K-DUR,KLOR-CON) 20 MEQ tablet, Take 1 tablet (20 mEq total) by mouth daily as needed (only on the day you take the as needed Lasix). (Patient taking differently: Take 20 mEq by mouth every Monday, Wednesday, and Friday. ), Disp: 90 tablet,  Rfl: 0 .  ranitidine (ZANTAC) 150 MG tablet, Take 150 mg by mouth daily as needed for heartburn., Disp: , Rfl:  .  Soft Lens Products (SENSITIVE EYES SALINE) SOLN, Place 1 drop into both eyes 3 (three) times daily., Disp: , Rfl:   Past Medical History: Past Medical History:  Diagnosis Date  . Arthritis   . Atrial fibrillation ([place])   . Cancer ([place])    skin  . Coronary artery disease   . Enlarged prostate   . GERD (gastroesophageal reflux disease)   . Hypertension   . Myocardial infarction ([facility])    mild  . Pleural effusion   . Sleep apnea    not wearing  cpap  not worn in 9-10 yrs    Tobacco Use: Social History   Tobacco Use  Smoking Status Former Smoker  . Packs/day: 0.50  . Years: 30.00  . Pack years: 15.00  . Types: Cigarettes  Smokeless Tobacco Former Systems developer  . Types: Snuff  Tobacco Comment   USING NICORETTE GUM TO TRY AND STOP USING SMOKELESS TOBACCO    Labs: Recent Review Flowsheet Data    Labs for ITP Cardiac and Pulmonary Rehab Latest Ref Rng & Units 06/27/2017 06/27/2017 06/28/2017 06/29/2017 09/20/2017   Cholestrol 100 - 199 mg/dL - - - - 121   LDLCALC 0 - 99 mg/dL - - - - 61  HDL >39 mg/dL - - - - 43   Trlycerides 0 - 149 mg/dL - - - - 85   Hemoglobin A1c 4.8 - 5.6 % - - - - -   PHART 7.350 - 7.450 - 7.350 - - -   PCO2ART 32.0 - 48.0 mmHg - 39.9 - - -   HCO3 20.0 - 28.0 mmol/L - 22.0 - - -   TCO2 22 - 32 mmol/L 24 23 24 24  -   ACIDBASEDEF 0.0 - 2.0 mmol/L - 3.0(H) - - -   O2SAT % - 99.0 - - -      Capillary Blood Glucose: Lab Results  Component Value Date   GLUCAP 96 07/01/2017   GLUCAP 124 (H) 06/30/2017   GLUCAP 111 (H) 06/30/2017   GLUCAP 116 (H) 06/30/2017   GLUCAP 122 (H) 06/30/2017     Exercise Target Goals: Exercise Program Goal: Individual exercise prescription set using results from initial 6 min walk test and THRR while considering  patient's activity barriers and safety.   Exercise Prescription Goal: Starting with aerobic activity  30 plus minutes a day, 3 days per week for initial exercise prescription. Provide home exercise prescription and guidelines that participant acknowledges understanding prior to discharge.  Activity Barriers & Risk Stratification: Activity Barriers & Cardiac Risk Stratification - 10/17/17 0953      Activity Barriers & Cardiac Risk Stratification   Activity Barriers  Back Problems   shoulders worn out   Cardiac Risk Stratification  High       6 Minute Walk: 6 Minute Walk    Row Name 10/17/17 0855         6 Minute Walk   Phase  Initial     Distance  1300 feet     Walk Time  6 minutes     # of Rest Breaks  0     MPH  2.46     METS  2.89     RPE  9     Perceived Dyspnea   7     VO2 Peak  9.27     Resting HR  63 bpm     Resting BP  110/50     Resting Oxygen Saturation   94 %     Exercise Oxygen Saturation  during 6 min walk  87 %     Max Ex. HR  87 bpm     Max Ex. BP  128/58     2 Minute Post BP  116/58        Oxygen Initial Assessment:   Oxygen Re-Evaluation:   Oxygen Discharge (Final Oxygen Re-Evaluation):   Initial Exercise Prescription:   Perform Capillary Blood Glucose checks as needed.  Exercise Prescription Changes:  Exercise Prescription Changes    Row Name 11/08/17 1200             Response to Exercise   Blood Pressure (Admit)  102/50       Blood Pressure (Exercise)  124/60       Blood Pressure (Exit)  102/60       Heart Rate (Admit)  74 bpm       Heart Rate (Exercise)  87 bpm       Heart Rate (Exit)  83 bpm       Rating of Perceived Exertion (Exercise)  12       Duration  Continue with 30 min of aerobic exercise without signs/symptoms of physical distress.       Intensity  THRR unchanged         Progression   Progression  Continue to progress workloads to maintain intensity without signs/symptoms of physical distress.       Average METs  2.17         Resistance Training   Weight  1       Reps  10-15         Treadmill   MPH  1.5        Grade  0       Minutes  17       METs  2.14         NuStep   Level  2       SPM  95       Minutes  22       METs  2.2          Exercise Comments:  Exercise Comments    Row Name 10/24/17 1618 11/15/17 0800         Exercise Comments  Patient has just starter. Has done well his first few visits.   Patient continues to do well in the program and handle all progressions with ease.          Exercise Goals and Review:  Exercise Goals    Row Name 10/17/17 0954             Exercise Goals   Increase Physical Activity  Yes       Intervention  Provide advice, education, support and counseling about physical activity/exercise needs.       Expected Outcomes  Short Term: Attend rehab on a regular basis to increase amount of physical activity.;Long Term: Add in home exercise to make exercise part of routine and to increase amount of physical activity.;Long Term: Exercising regularly at least 3-5 days a week.       Increase Strength and Stamina  Yes       Intervention  Provide advice, education, support and counseling about physical activity/exercise needs.       Expected Outcomes  Short Term: Increase workloads from initial exercise prescription for resistance, speed, and METs.;Long Term: Improve cardiorespiratory fitness, muscular endurance and strength as measured by increased METs and functional capacity (6MWT)       Able to understand and use rate of perceived exertion (RPE) scale  Yes       Intervention  Provide education and explanation on how to use RPE scale       Expected Outcomes  Short Term: Able to use RPE daily in rehab to express subjective intensity level;Long Term:  Able to use RPE to guide intensity level when exercising independently       Knowledge and understanding of Target Heart Rate Range (THRR)  Yes       Intervention  Provide education and explanation of THRR including how the numbers were predicted and where they are located for reference       Expected  Outcomes  Short Term: Able to use daily as guideline for intensity in rehab;Long Term: Able to use THRR to govern intensity when exercising independently       Able to check pulse independently  Yes       Intervention  Provide education and demonstration on how to check pulse in carotid and radial arteries.       Expected Outcomes  Short Term: Able to explain why pulse checking is important during independent exercise;Long Term: Able to check pulse independently and  accurately       Understanding of Exercise Prescription  Yes       Intervention  Provide education, explanation, and written materials on patient's individual exercise prescription       Expected Outcomes  Short Term: Able to explain program exercise prescription;Long Term: Able to explain home exercise prescription to exercise independently          Exercise Goals Re-Evaluation : Exercise Goals Re-Evaluation    Row Name 10/24/17 1614 11/15/17 0757           Exercise Goal Re-Evaluation   Exercise Goals Review  Increase Physical Activity;Increase Strength and Stamina;Understanding of Exercise Prescription;Knowledge and understanding of Target Heart Rate Range (THRR)  Increase Physical Activity;Increase Strength and Stamina;Understanding of Exercise Prescription;Knowledge and understanding of Target Heart Rate Range (THRR)      Comments  Patient just started the program. He completed 2 visit. Will continue to monitor his progress  Patient is still pretty new to the program, having completed 8 sessions due to having to be out some. He has tolerated all of his progressions well and getting stronger everyday. Has already been able to get back in to fishing and is very excited about that.       Expected Outcomes  To increase in functional capicity and ADL's. Wants to get back to doing the things he wants to do.   Increase in functional capacity and ablilty to get back in to doing the things that he wants to do such as golf and fishing.            Discharge Exercise Prescription (Final Exercise Prescription Changes): Exercise Prescription Changes - 11/08/17 1200      Response to Exercise   Blood Pressure (Admit)  102/50    Blood Pressure (Exercise)  124/60    Blood Pressure (Exit)  102/60    Heart Rate (Admit)  74 bpm    Heart Rate (Exercise)  87 bpm    Heart Rate (Exit)  83 bpm    Rating of Perceived Exertion (Exercise)  12    Duration  Continue with 30 min of aerobic exercise without signs/symptoms of physical distress.    Intensity  THRR unchanged      Progression   Progression  Continue to progress workloads to maintain intensity without signs/symptoms of physical distress.    Average METs  2.17      Resistance Training   Weight  1    Reps  10-15      Treadmill   MPH  1.5    Grade  0    Minutes  17    METs  2.14      NuStep   Level  2    SPM  95    Minutes  22    METs  2.2       Nutrition:  Target Goals: Understanding of nutrition guidelines, daily intake of sodium 1500mg , cholesterol 200mg , calories 30% from fat and 7% or less from saturated fats, daily to have 5 or more servings of fruits and vegetables.  Biometrics: Pre Biometrics - 10/17/17 0857      Pre Biometrics   Height  5\' 10"  (1.778 m)    Waist Circumference  40.5 inches    Hip Circumference  36 inches    Waist to Hip Ratio  1.12 %    Triceps Skinfold  4 mm    % Body Fat  22.4 %    Grip Strength  34.9 kg  Flexibility  0 in   bulging discs in back    Single Leg Stand  8 seconds        Nutrition Therapy Plan and Nutrition Goals: Nutrition Therapy & Goals - 11/17/17 0747      Nutrition Therapy   RD appointment deferred  Yes      Personal Nutrition Goals   Comments  Patient continues to say he is eating a heart healthy diet. Will continue to monitor.        Nutrition Assessments: Nutrition Assessments - 10/17/17 1008      MEDFICTS Scores   Pre Score  21       Nutrition Goals Re-Evaluation:   Nutrition Goals  Discharge (Final Nutrition Goals Re-Evaluation):   Psychosocial: Target Goals: Acknowledge presence or absence of significant depression and/or stress, maximize coping skills, provide positive support system. Participant is able to verbalize types and ability to use techniques and skills needed for reducing stress and depression.  Initial Review & Psychosocial Screening: Initial Psych Review & Screening - 10/17/17 1007      Initial Review   Current issues with  None Identified      Family Dynamics   Good Support System?  Yes      Barriers   Psychosocial barriers to participate in program  There are no identifiable barriers or psychosocial needs.      Screening Interventions   Interventions  Encouraged to exercise    Expected Outcomes  Short Term goal: Identification and review with participant of any Quality of Life or Depression concerns found by scoring the questionnaire.;Long Term goal: The participant improves quality of Life and PHQ9 Scores as seen by post scores and/or verbalization of changes       Quality of Life Scores: Quality of Life - 10/17/17 0858      Quality of Life   Select  Quality of Life      Quality of Life Scores   Health/Function Pre  15.43 %    Socioeconomic Pre  23.17 %    Psych/Spiritual Pre  24 %    Family Pre  19.5 %    GLOBAL Pre  19.27 %      Scores of 19 and below usually indicate a poorer quality of life in these areas.  A difference of  2-3 points is a clinically meaningful difference.  A difference of 2-3 points in the total score of the Quality of Life Index has been associated with significant improvement in overall quality of life, self-image, physical symptoms, and general health in studies assessing change in quality of life.  PHQ-9: Recent Review Flowsheet Data    Depression screen [facility] 2/9 10/17/2017   Decreased Interest 0   Down, Depressed, Hopeless 0   PHQ - 2 Score 0   Altered sleeping 1   Tired, decreased energy 1   Change in  appetite 0   Feeling bad or failure about yourself  0   Trouble concentrating 1   Moving slowly or fidgety/restless 0   Suicidal thoughts 0   PHQ-9 Score 3   Difficult doing work/chores Somewhat difficult     Interpretation of Total Score  Total Score Depression Severity:  1-4 = Minimal depression, 5-9 = Mild depression, 10-14 = Moderate depression, 15-19 = Moderately severe depression, 20-27 = Severe depression   Psychosocial Evaluation and Intervention: Psychosocial Evaluation - 10/17/17 1007      Psychosocial Evaluation & Interventions   Interventions  Encouraged to exercise with the program and  follow exercise prescription    Continue Psychosocial Services   No Follow up required       Psychosocial Re-Evaluation: Psychosocial Re-Evaluation    [place] Name 11/17/17 [phone]             Psychosocial Re-Evaluation   Current issues with  None Identified       Comments  Patient's initial QOL score was 19.27 and his PHQ-9 score was 3 with no issues identified.       Expected Outcomes  Patient will not have any psychosocial issues identified at discharge.        Interventions  Stress management education;Encouraged to attend Cardiac Rehabilitation for the exercise;Relaxation education       Continue Psychosocial Services   No Follow up required          Psychosocial Discharge (Final Psychosocial Re-Evaluation): Psychosocial Re-Evaluation - 11/17/17 0753      Psychosocial Re-Evaluation   Current issues with  None Identified    Comments  Patient's initial QOL score was 19.27 and his PHQ-9 score was 3 with no issues identified.    Expected Outcomes  Patient will not have any psychosocial issues identified at discharge.     Interventions  Stress management education;Encouraged to attend Cardiac Rehabilitation for the exercise;Relaxation education    Continue Psychosocial Services   No Follow up required       Vocational Rehabilitation: Provide vocational rehab assistance to  qualifying candidates.   Vocational Rehab Evaluation & Intervention: Vocational Rehab - 10/17/17 1010      Initial Vocational Rehab Evaluation & Intervention   Assessment shows need for Vocational Rehabilitation  No       Education: Education Goals: Education classes will be provided on a weekly basis, covering required topics. Participant will state understanding/return demonstration of topics presented.  Learning Barriers/Preferences: Learning Barriers/Preferences - 10/17/17 1008      Learning Barriers/Preferences   Learning Barriers  None    Learning Preferences  Pictoral;Video;Written Material       Education Topics: Hypertension, Hypertension Reduction -Define heart disease and high blood pressure. Discus how high blood pressure affects the body and ways to reduce high blood pressure.   Exercise and Your Heart -Discuss why it is important to exercise, the FITT principles of exercise, normal and abnormal responses to exercise, and how to exercise safely.   Angina -Discuss definition of angina, causes of angina, treatment of angina, and how to decrease risk of having angina.   Cardiac Medications -Review what the following cardiac medications are used for, how they affect the body, and side effects that may occur when taking the medications.  Medications include Aspirin, Beta blockers, calcium channel blockers, ACE Inhibitors, angiotensin receptor blockers, diuretics, digoxin, and antihyperlipidemics.   Congestive Heart Failure -Discuss the definition of CHF, how to live with CHF, the signs and symptoms of CHF, and how keep track of weight and sodium intake.   Heart Disease and Intimacy -Discus the effect sexual activity has on the heart, how changes occur during intimacy as we age, and safety during sexual activity.   Smoking Cessation / COPD -Discuss different methods to quit smoking, the health benefits of quitting smoking, and the definition of COPD.   CARDIAC  REHAB PHASE II EXERCISE from 11/09/2017 in [place]  Date  10/26/17  Educator  Etheleen Mayhew  Instruction Review Code  2- Demonstrated Understanding      Nutrition I: Fats -Discuss the types of cholesterol, what cholesterol does to the heart,  and how cholesterol levels can be controlled.   Nutrition II: Labels -Discuss the different components of food labels and how to read food label   CARDIAC REHAB PHASE II EXERCISE from 11/09/2017 in [place]  Date  11/09/17  Educator  Etheleen Mayhew  Instruction Review Code  2- Demonstrated Understanding      Heart Parts/Heart Disease and PAD -Discuss the anatomy of the heart, the pathway of blood circulation through the heart, and these are affected by heart disease.   Stress I: Signs and Symptoms -Discuss the causes of stress, how stress may lead to anxiety and depression, and ways to limit stress.   Stress II: Relaxation -Discuss different types of relaxation techniques to limit stress.   Warning Signs of Stroke / TIA -Discuss definition of a stroke, what the signs and symptoms are of a stroke, and how to identify when someone is having stroke.   Knowledge Questionnaire Score: Knowledge Questionnaire Score - 10/17/17 1009      Knowledge Questionnaire Score   Pre Score  24/28       Core Components/Risk Factors/Patient Goals at Admission: Personal Goals and Risk Factors at Admission - 10/17/17 1010      Core Components/Risk Factors/Patient Goals on Admission    Weight Management  Weight Maintenance    Personal Goal Other  Yes    Personal Goal  Be able to function during ADL's and be able to do things I want to do.     Intervention  Attend CR 3 x week and supplement at home exercise 2 x week.     Expected Outcomes  Reach personal goals       Core Components/Risk Factors/Patient Goals Review:  Goals and Risk Factor Review    Row Name 11/17/17 0749             Core  Components/Risk Factors/Patient Goals Review   Personal Goals Review  Weight Management/Obesity Being able to function; do ADL's do things I want to do; live a good life.        Review  Patient has completed 9 sessions gaining 5 lbs since he started the program. His attendance has been inconsitent but overall, he is doing well in the program with progression. He is still knew to the program but says he feels more like doing more now. He was able to go fishing last week without getting tired. Will continue to monitor for progress.        Expected Outcomes  Patient will continue to attend sesisons more consistently and complete the program meeting his personal goals.           Core Components/Risk Factors/Patient Goals at Discharge (Final Review):  Goals and Risk Factor Review - 11/17/17 0749      Core Components/Risk Factors/Patient Goals Review   Personal Goals Review  Weight Management/Obesity   Being able to function; do ADL's do things I want to do; live a good life.    Review  Patient has completed 9 sessions gaining 5 lbs since he started the program. His attendance has been inconsitent but overall, he is doing well in the program with progression. He is still knew to the program but says he feels more like doing more now. He was able to go fishing last week without getting tired. Will continue to monitor for progress.     Expected Outcomes  Patient will continue to attend sesisons more consistently and complete the program meeting his personal goals.  ITP Comments: ITP Comments    Row Name 10/26/17 0746           ITP Comments  Patient is new to program. He has completed 2 sessions. Will continue to monitor for progress.           Comments: ITP REVIEW Patient doing well in the program. Will continue to monitor for progress.

## 2017-11-18 ENCOUNTER — Encounter (HOSPITAL_COMMUNITY)
Admission: RE | Admit: 2017-11-18 | Discharge: 2017-11-18 | Disposition: A | Discharge status: Home / Self Care | Payer: PPO | Source: Ambulatory Visit | Attending: Cardiology | Admitting: Cardiology

## 2017-11-18 DIAGNOSIS — Z951 Presence of aortocoronary bypass graft: Secondary | ICD-10-CM

## 2017-11-18 NOTE — Progress Notes (Signed)
Daily Session Note  Patient Details  Name: COBE VINEY MRN: [id number] Date of Birth: 1945/10/18 Referring Provider:    Encounter Date: 11/18/2017  Check In: Session Check In - 11/18/17 0815      Check-In   Supervising physician immediately available to respond to emergencies  See telemetry face sheet for immediately available MD    Location  [facility]    Staff Present  Benay Pike, Exercise Physiologist;Malayjah Otoole Wynetta Emery, RN, BSN;Diane Coad, MS, EP, [facility], Exercise Physiologist    Medication changes reported      No    Fall or balance concerns reported     No    Warm-up and Cool-down  Performed as group-led instruction    Resistance Training Performed  Yes    VAD Patient?  No    PAD/SET Patient?  No      Pain Assessment   Currently in Pain?  No/denies    Pain Score  0-No pain    Multiple Pain Sites  No       Capillary Blood Glucose: No results found for this or any previous visit (from the past 24 hour(s)).    Social History   Tobacco Use  Smoking Status Former Smoker  . Packs/day: 0.50  . Years: 30.00  . Pack years: 15.00  . Types: Cigarettes  Smokeless Tobacco Former Systems developer  . Types: Snuff  Tobacco Comment   USING NICORETTE GUM TO TRY AND STOP USING SMOKELESS TOBACCO    Goals Met:  Independence with exercise equipment Exercise tolerated well No report of cardiac concerns or symptoms Strength training completed today  Goals Unmet:  Not Applicable  Comments: Pt able to follow exercise prescription today without complaint.  Will continue to monitor for progression. Check out 915.   Dr. Kate Sable is Medical Director for [facility] [facility].

## 2017-11-21 ENCOUNTER — Encounter (HOSPITAL_COMMUNITY)
Admission: RE | Admit: 2017-11-21 | Discharge: 2017-11-21 | Disposition: A | Discharge status: Home / Self Care | Payer: PPO | Source: Ambulatory Visit | Attending: Cardiology | Admitting: Cardiology

## 2017-11-21 DIAGNOSIS — Z951 Presence of aortocoronary bypass graft: Secondary | ICD-10-CM

## 2017-11-21 NOTE — Progress Notes (Signed)
Daily Session Note  Patient Details  Name: Chase Guerrero MRN: [id number] Date of Birth: Jan 23, 1946 Referring Provider:    Encounter Date: 11/21/2017  Check In: Session Check In - 11/21/17 0815      Check-In   Supervising physician immediately available to respond to emergencies  See telemetry face sheet for immediately available MD    Location  [facility]    Staff Present  Benay Pike, Exercise Physiologist;Audley Hinojos Wynetta Emery, RN, BSN;Diane Coad, MS, EP, [facility], Exercise Physiologist    Medication changes reported      No    Fall or balance concerns reported     No    Warm-up and Cool-down  Performed as group-led instruction    Resistance Training Performed  Yes    VAD Patient?  No    PAD/SET Patient?  No      Pain Assessment   Currently in Pain?  No/denies    Pain Score  0-No pain    Multiple Pain Sites  No       Capillary Blood Glucose: No results found for this or any previous visit (from the past 24 hour(s)).    Social History   Tobacco Use  Smoking Status Former Smoker  . Packs/day: 0.50  . Years: 30.00  . Pack years: 15.00  . Types: Cigarettes  Smokeless Tobacco Former Systems developer  . Types: Snuff  Tobacco Comment   USING NICORETTE GUM TO TRY AND STOP USING SMOKELESS TOBACCO    Goals Met:  Independence with exercise equipment Exercise tolerated well No report of cardiac concerns or symptoms Strength training completed today  Goals Unmet:  Not Applicable  Comments: Pt able to follow exercise prescription today without complaint.  Will continue to monitor for progression. Check out 915.   Dr. Kate Sable is Medical Director for [facility] Cardiac and Pulmonary Rehab.

## 2017-11-23 ENCOUNTER — Encounter (HOSPITAL_COMMUNITY)
Admission: RE | Admit: 2017-11-23 | Discharge: 2017-11-23 | Disposition: A | Discharge status: Home / Self Care | Payer: PPO | Source: Ambulatory Visit | Attending: Cardiology | Admitting: Cardiology

## 2017-11-23 DIAGNOSIS — Z951 Presence of aortocoronary bypass graft: Secondary | ICD-10-CM

## 2017-11-23 NOTE — Progress Notes (Signed)
Daily Session Note  Patient Details  Name: NEYLAND PETTENGILL MRN: [id number] Date of Birth: 1945-02-11 Referring Provider:    Encounter Date: 11/23/2017  Check In: Session Check In - 11/23/17 0815      Check-In   Supervising physician immediately available to respond to emergencies  See telemetry face sheet for immediately available MD    Location  [facility]    Staff Present  Russella Dar, MS, EP, [facility], Exercise Physiologist;Tyheim Vanalstyne Zachery Conch, Exercise Physiologist    Medication changes reported      No    Fall or balance concerns reported     No    Warm-up and Cool-down  Performed as group-led instruction    Resistance Training Performed  Yes    VAD Patient?  No    PAD/SET Patient?  No      Pain Assessment   Currently in Pain?  No/denies    Pain Score  0-No pain    Multiple Pain Sites  No       Capillary Blood Glucose: No results found for this or any previous visit (from the past 24 hour(s)).    Social History   Tobacco Use  Smoking Status Former Smoker  . Packs/day: 0.50  . Years: 30.00  . Pack years: 15.00  . Types: Cigarettes  Smokeless Tobacco Former Systems developer  . Types: Snuff  Tobacco Comment   USING NICORETTE GUM TO TRY AND STOP USING SMOKELESS TOBACCO    Goals Met:  Independence with exercise equipment Exercise tolerated well No report of cardiac concerns or symptoms Strength training completed today  Goals Unmet:  Not Applicable  Comments: Pt able to follow exercise prescription today without complaint.  Will continue to monitor for progression. Check out 9:15.   Dr. Kate Sable is Medical Director for [facility] Cardiac and Pulmonary Rehab.

## 2017-11-25 ENCOUNTER — Encounter (HOSPITAL_COMMUNITY): Payer: PPO

## 2017-11-28 ENCOUNTER — Encounter (HOSPITAL_COMMUNITY)
Admission: RE | Admit: 2017-11-28 | Discharge: 2017-11-28 | Disposition: A | Discharge status: Home / Self Care | Payer: PPO | Source: Ambulatory Visit | Attending: Cardiology | Admitting: Cardiology

## 2017-11-28 DIAGNOSIS — Z951 Presence of aortocoronary bypass graft: Secondary | ICD-10-CM | POA: Diagnosis not present

## 2017-11-28 NOTE — Progress Notes (Signed)
Daily Session Note  Patient Details  Name: Chase Guerrero MRN: [id number] Date of Birth: February 21, 1945 Referring Provider:    Encounter Date: 11/28/2017  Check In: Session Check In - 11/28/17 0815      Check-In   Supervising physician immediately available to respond to emergencies  See telemetry face sheet for immediately available MD    Location  [facility]    Staff Present  Russella Dar, MS, EP, [facility], Exercise Physiologist;Eloise Picone Zachery Conch, Exercise Physiologist    Medication changes reported      No    Fall or balance concerns reported     No    Warm-up and Cool-down  Performed as group-led instruction    Resistance Training Performed  Yes    VAD Patient?  No    PAD/SET Patient?  No      Pain Assessment   Currently in Pain?  No/denies    Pain Score  0-No pain    Multiple Pain Sites  No       Capillary Blood Glucose: No results found for this or any previous visit (from the past 24 hour(s)).    Social History   Tobacco Use  Smoking Status Former Smoker  . Packs/day: 0.50  . Years: 30.00  . Pack years: 15.00  . Types: Cigarettes  Smokeless Tobacco Former Systems developer  . Types: Snuff  Tobacco Comment   USING NICORETTE GUM TO TRY AND STOP USING SMOKELESS TOBACCO    Goals Met:  Proper associated with RPD/PD & O2 Sat Independence with exercise equipment Exercise tolerated well No report of cardiac concerns or symptoms Strength training completed today  Goals Unmet:  Not Applicable  Comments: Pt able to follow exercise prescription today without complaint.  Will continue to monitor for progression. Check out 9:15.   Dr. Kate Sable is Medical Director for [facility] Cardiac and Pulmonary Rehab.

## 2017-11-30 ENCOUNTER — Encounter (HOSPITAL_COMMUNITY)
Admission: RE | Admit: 2017-11-30 | Discharge: 2017-11-30 | Disposition: A | Discharge status: Home / Self Care | Payer: PPO | Source: Ambulatory Visit | Attending: Cardiology | Admitting: Cardiology

## 2017-11-30 ENCOUNTER — Ambulatory Visit: Payer: PPO | Admitting: Cardiothoracic Surgery

## 2017-11-30 VITALS — BP 110/65 | HR 74 | Resp 20 | Ht 70.0 in | Wt 195.0 lb

## 2017-11-30 DIAGNOSIS — J90 Pleural effusion, not elsewhere classified: Secondary | ICD-10-CM

## 2017-11-30 DIAGNOSIS — Z951 Presence of aortocoronary bypass graft: Secondary | ICD-10-CM | POA: Diagnosis not present

## 2017-11-30 DIAGNOSIS — I2510 Atherosclerotic heart disease of native coronary artery without angina pectoris: Secondary | ICD-10-CM | POA: Diagnosis not present

## 2017-11-30 NOTE — Progress Notes (Signed)
Daily Session Note  Patient Details  Name: DOMNIQUE VANTINE MRN: [id number] Date of Birth: [birth year] Referring Provider:    Encounter Date: 11/30/2017  Check In: Session Check In - 11/30/17 0815      Check-In   Supervising physician immediately available to respond to emergencies  See telemetry face sheet for immediately available MD    Location  [facility]    Staff Present  Russella Dar, MS, EP, [facility], Exercise Physiologist;Eberardo Demello Zachery Conch, Exercise Physiologist    Medication changes reported      No    Fall or balance concerns reported     No    Warm-up and Cool-down  Performed as group-led instruction    Resistance Training Performed  Yes    VAD Patient?  No    PAD/SET Patient?  No      Pain Assessment   Currently in Pain?  No/denies    Pain Score  0-No pain    Multiple Pain Sites  No       Capillary Blood Glucose: No results found for this or any previous visit (from the past 24 hour(s)).    Social History   Tobacco Use  Smoking Status Former Smoker  . Packs/day: 0.50  . Years: 30.00  . Pack years: 15.00  . Types: Cigarettes  Smokeless Tobacco Former Systems developer  . Types: Snuff  Tobacco Comment   USING NICORETTE GUM TO TRY AND STOP USING SMOKELESS TOBACCO    Goals Met:  Proper associated with RPD/PD & O2 Sat Independence with exercise equipment Exercise tolerated well No report of cardiac concerns or symptoms Strength training completed today  Goals Unmet:  Not Applicable  Comments: Pt able to follow exercise prescription today without complaint.  Will continue to monitor for progression. Check out 9:15.   Dr. Kate Sable is Medical Director for [facility] Cardiac and Pulmonary Rehab.

## 2017-11-30 NOTE — Progress Notes (Signed)
PCP is [name], MD Referring Provider is Leonie Man, MD  Chief Complaint  Chase Guerrero presents with  . 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 Chase Guerrero required placement of a left Pleurx catheter because of recurrent left pleural effusion. The catheter has been removed Most recent chest x-ray is clear Pleurx catheter incision has healed. Breath sounds are clear and equal bilaterally  Chase Guerrero is well into the cardiac rehab program using the elliptical machine and is progressing well.  He has some residual mild chest wall discomfort.  Past Medical History:  Diagnosis Date  . Arthritis   . Atrial fibrillation (Normal)   . Cancer ([place])    skin  . Coronary artery disease   . Enlarged prostate   . GERD (gastroesophageal reflux disease)   . Hypertension   . Myocardial infarction (HCC)    mild  . Pleural effusion   . Sleep apnea    not wearing  cpap  not worn in 9-10 yrs    Past Surgical History:  Procedure Laterality Date  . CHEST TUBE INSERTION Left 08/16/2017   Procedure: INSERTION PLEURAL DRAINAGE CATHETER;  Surgeon: Ivin Poot, MD;  Location: [place];  Service: Thoracic;  Laterality: Left;  . COLONOSCOPY N/A 07/23/2015   Procedure: COLONOSCOPY;  Surgeon: Daneil Dolin, MD;  [facility];  Service: Endoscopy;  Laterality: N/A;  10:30 Am  . CORONARY ARTERY BYPASS GRAFT N/A 06/27/2017   Procedure: CORONARY ARTERY BYPASS GRAFTING (CABG) x 2  WITH ENDOSCOPIC HARVESTING OF RIGHT SAPHENOUS VEIN;  Surgeon: Ivin Poot, MD;  Location: [place];  Service: Open Heart Surgery;  Laterality: N/A;  . IR THORACENTESIS ASP PLEURAL SPACE W/IMG GUIDE  08/08/2017  . LEFT HEART CATH AND CORONARY ANGIOGRAPHY N/A 06/14/2017   Procedure: LEFT HEART CATH AND CORONARY ANGIOGRAPHY;  Surgeon: Leonie Man, MD;  Location: [place] [facility];  Service: Cardiovascular;  Laterality: N/A;  . REMOVAL OF PLEURAL DRAINAGE CATHETER Left  10/13/2017   Procedure: REMOVAL OF PLEURAL DRAINAGE CATHETER;  Surgeon: Ivin Poot, MD;  Location: [place];  Service: Thoracic;  Laterality: Left;  . Right hand surgery    . Right knee arthroscopy    . TEE WITHOUT CARDIOVERSION N/A 06/27/2017   Procedure: TRANSESOPHAGEAL ECHOCARDIOGRAM (TEE);  Surgeon: Prescott Gum, Collier Salina, MD;  Location: [place];  Service: Open Heart Surgery;  Laterality: N/A;    Family History  Problem Relation Age of Onset  . Alzheimer's disease Mother   . Arthritis Sister   . Heart attack Brother   . Hypercholesterolemia Sister   . Liver disease Sister     Social History Social History   Tobacco Use  . Smoking status: Former Smoker    Packs/day: 0.50    Years: 30.00    Pack years: 15.00    Types: Cigarettes  . Smokeless tobacco: Former Systems developer    Types: Snuff  . Tobacco comment: USING NICORETTE GUM TO TRY AND STOP USING SMOKELESS TOBACCO  Substance Use Topics  . Alcohol use: No  . Drug use: No    Current Outpatient Medications  Medication Sig Dispense Refill  . acetaminophen (TYLENOL) 500 MG tablet Take 2 tablets (1,000 mg total) by mouth every 6 (six) hours as needed for mild pain or fever. 30 tablet 0  . aspirin EC 81 MG tablet Take 81 mg by mouth daily.    Marland Kitchen atorvastatin (LIPITOR) 80  MG tablet Take 1 tablet (80 mg total) by mouth daily. 90 tablet 3  . cetirizine (ZYRTEC) 10 MG tablet Take 10 mg by mouth daily as needed for allergies.    . finasteride (PROSCAR) 5 MG tablet Take 5 mg by mouth daily.    . fluticasone (FLONASE) 50 MCG/ACT nasal spray Place 1 spray into both nostrils daily as needed for allergies or rhinitis.    . furosemide (LASIX) 20 MG tablet Take 1 tablet (20 mg total) by mouth daily as needed. Take one tab by mouth daily x 3 days, then only as needed for swelling or edema (Chase Guerrero taking differently: Take 20 mg by mouth every Monday, Wednesday, and Friday. Take one tab by mouth daily x 3 days, then only as needed for swelling or edema) 90  tablet 0  . Menthol, Topical Analgesic, (BIOFREEZE EX) Apply 1 application topically daily as needed (pain).    . metoprolol tartrate (LOPRESSOR) 25 MG tablet Take 12.5 mg by mouth 2 (two) times daily.     . nitroGLYCERIN (NITROSTAT) 0.4 MG SL tablet Place 0.4 mg under the tongue every 5 (five) minutes as needed for chest pain.     . potassium chloride SA (K-DUR,KLOR-CON) 20 MEQ tablet Take 1 tablet (20 mEq total) by mouth daily as needed (only on the day you take the as needed Lasix). (Chase Guerrero taking differently: Take 20 mEq by mouth every Monday, Wednesday, and Friday. ) 90 tablet 0  . ranitidine (ZANTAC) 150 MG tablet Take 150 mg by mouth daily as needed for heartburn.    . Soft Lens Products (SENSITIVE EYES SALINE) SOLN Place 1 drop into both eyes 3 (three) times daily.     No current facility-administered medications for this visit.     No Known Allergies  Review of Systems  No shortness of breath 1.5 miles today   BP 110/65   Pulse 74   Resp 20   Ht 5\' 10"  (1.778 m)   Wt 195 lb (88.5 kg)   SpO2 95% Comment: RA  BMI 27.98 kg/m  Physical Exam Alert and comfortable Lungs clear Heart rate regular Sternal incision stable well-healed No peripheral edema Neuro intact  Diagnostic Tests: None today  Impression: Chase Guerrero is recovering from multivessel CABG. Recurrent left pleural effusion has resolved Continue current medications.  Importance of heart healthy diet and lifestyle have been emphasized to Chase Guerrero.  Plan: Return as needed.   Len Childs, MD [facility] [phone]

## 2017-12-02 ENCOUNTER — Encounter (HOSPITAL_COMMUNITY)
Admission: RE | Admit: 2017-12-02 | Discharge: 2017-12-02 | Disposition: A | Discharge status: Home / Self Care | Payer: PPO | Source: Ambulatory Visit | Attending: Cardiology | Admitting: Cardiology

## 2017-12-02 DIAGNOSIS — Z951 Presence of aortocoronary bypass graft: Secondary | ICD-10-CM

## 2017-12-02 NOTE — Progress Notes (Signed)
Daily Session Note  Patient Details  Name: Chase Guerrero MRN: [id number] Date of Birth: 02-Jun-1945 Referring Provider:    Encounter Date: 12/02/2017  Check In: Session Check In - 12/02/17 0815      Check-In   Supervising physician immediately available to respond to emergencies  See telemetry face sheet for immediately available MD    Location  [facility]    Staff Present  Russella Dar, MS, EP, [facility], Exercise Physiologist;Amanda Zachery Conch, Exercise Physiologist;Kira Hartl Wynetta Emery, RN, BSN    Medication changes reported      No    Fall or balance concerns reported     No    Warm-up and Cool-down  Performed as group-led Higher education careers adviser Performed  Yes    VAD Patient?  No    PAD/SET Patient?  No      Pain Assessment   Currently in Pain?  No/denies    Pain Score  0-No pain    Multiple Pain Sites  No       Capillary Blood Glucose: No results found for this or any previous visit (from the past 24 hour(s)).    Social History   Tobacco Use  Smoking Status Former Smoker  . Packs/day: 0.50  . Years: 30.00  . Pack years: 15.00  . Types: Cigarettes  Smokeless Tobacco Former Systems developer  . Types: Snuff  Tobacco Comment   USING NICORETTE GUM TO TRY AND STOP USING SMOKELESS TOBACCO    Goals Met:  Independence with exercise equipment Exercise tolerated well No report of cardiac concerns or symptoms Strength training completed today  Goals Unmet:  Not Applicable  Comments: Pt able to follow exercise prescription today without complaint.  Will continue to monitor for progression. Check out 915.   Dr. Kate Sable is Medical Director for [facility] Cardiac and Pulmonary Rehab.

## 2017-12-05 ENCOUNTER — Encounter (HOSPITAL_COMMUNITY)
Admission: RE | Admit: 2017-12-05 | Discharge: 2017-12-05 | Disposition: A | Discharge status: Home / Self Care | Payer: PPO | Source: Ambulatory Visit | Attending: Cardiology | Admitting: Cardiology

## 2017-12-05 DIAGNOSIS — Z951 Presence of aortocoronary bypass graft: Secondary | ICD-10-CM | POA: Diagnosis not present

## 2017-12-05 NOTE — Progress Notes (Signed)
Daily Session Note  Patient Details  Name: Chase Guerrero MRN: [id number] Date of Birth: 02-Apr-1945 Referring Provider:    Encounter Date: 12/05/2017  Check In: Session Check In - 12/05/17 0815      Check-In   Supervising physician immediately available to respond to emergencies  See telemetry face sheet for immediately available MD    Location  [facility]    Staff Present  Benay Pike, Exercise Physiologist;Mansoor Hillyard Wynetta Emery, RN, BSN;Diane Coad, MS, EP, [facility], Exercise Physiologist    Medication changes reported      No    Fall or balance concerns reported     No    Warm-up and Cool-down  Performed as group-led instruction    Resistance Training Performed  Yes    VAD Patient?  No    PAD/SET Patient?  No      Pain Assessment   Currently in Pain?  No/denies    Pain Score  0-No pain    Multiple Pain Sites  No       Capillary Blood Glucose: No results found for this or any previous visit (from the past 24 hour(s)).    Social History   Tobacco Use  Smoking Status Former Smoker  . Packs/day: 0.50  . Years: 30.00  . Pack years: 15.00  . Types: Cigarettes  Smokeless Tobacco Former Systems developer  . Types: Snuff  Tobacco Comment   USING NICORETTE GUM TO TRY AND STOP USING SMOKELESS TOBACCO    Goals Met:  Independence with exercise equipment Exercise tolerated well No report of cardiac concerns or symptoms Strength training completed today  Goals Unmet:  Not Applicable  Comments: Pt able to follow exercise prescription today without complaint.  Will continue to monitor for progression. Check out 915.    Dr. Kate Sable is Medical Director for [facility] Cardiac and Pulmonary Rehab.

## 2017-12-07 ENCOUNTER — Encounter (HOSPITAL_COMMUNITY)
Admission: RE | Admit: 2017-12-07 | Discharge: 2017-12-07 | Disposition: A | Discharge status: Home / Self Care | Payer: PPO | Source: Ambulatory Visit | Attending: Cardiology | Admitting: Cardiology

## 2017-12-07 DIAGNOSIS — Z951 Presence of aortocoronary bypass graft: Secondary | ICD-10-CM | POA: Diagnosis not present

## 2017-12-07 NOTE — Progress Notes (Signed)
Daily Session Note  Patient Details  Name: Chase Guerrero MRN: [id number] Date of Birth: Mar 23, 1945 Referring Provider:    Encounter Date: 12/07/2017  Check In: Session Check In - 12/07/17 0815      Check-In   Supervising physician immediately available to respond to emergencies  See telemetry face sheet for immediately available MD    Location  [facility]    Staff Present  Benay Pike, Exercise Physiologist;Bear Osten Wynetta Emery, RN, BSN;Diane Coad, MS, EP, [facility], Exercise Physiologist    Medication changes reported      No    Fall or balance concerns reported     No    Warm-up and Cool-down  Performed as group-led instruction    Resistance Training Performed  Yes    VAD Patient?  No    PAD/SET Patient?  No      Pain Assessment   Currently in Pain?  No/denies    Pain Score  0-No pain    Multiple Pain Sites  No       Capillary Blood Glucose: No results found for this or any previous visit (from the past 24 hour(s)).    Social History   Tobacco Use  Smoking Status Former Smoker  . Packs/day: 0.50  . Years: 30.00  . Pack years: 15.00  . Types: Cigarettes  Smokeless Tobacco Former Systems developer  . Types: Snuff  Tobacco Comment   USING NICORETTE GUM TO TRY AND STOP USING SMOKELESS TOBACCO    Goals Met:  Independence with exercise equipment Exercise tolerated well No report of cardiac concerns or symptoms Strength training completed today  Goals Unmet:  Not Applicable  Comments: Pt able to follow exercise prescription today without complaint.  Will continue to monitor for progression. Check out 915.   Dr. Kate Sable is Medical Director for [facility] Cardiac and Pulmonary Rehab.

## 2017-12-09 ENCOUNTER — Encounter (HOSPITAL_COMMUNITY)
Admission: RE | Admit: 2017-12-09 | Discharge: 2017-12-09 | Disposition: A | Discharge status: Home / Self Care | Payer: PPO | Source: Ambulatory Visit | Attending: Cardiology | Admitting: Cardiology

## 2017-12-09 DIAGNOSIS — Z951 Presence of aortocoronary bypass graft: Secondary | ICD-10-CM | POA: Insufficient documentation

## 2017-12-09 NOTE — Progress Notes (Signed)
Daily Session Note  Patient Details  Name: Chase Guerrero MRN: [id number] Date of Birth: [birth year] Referring Provider:    Encounter Date: 12/09/2017  Check In: Session Check In - 12/09/17 0815      Check-In   Supervising physician immediately available to respond to emergencies  See telemetry face sheet for immediately available MD    Location  [facility]    Staff Present  Aundra Dubin, RN, BSN;Diane Coad, MS, EP, [facility], Exercise Physiologist    Medication changes reported      No    Fall or balance concerns reported     No    Warm-up and Cool-down  Performed as group-led instruction    Resistance Training Performed  Yes    VAD Patient?  No    PAD/SET Patient?  No      Pain Assessment   Currently in Pain?  No/denies    Pain Score  0-No pain    Multiple Pain Sites  No       Capillary Blood Glucose: No results found for this or any previous visit (from the past 24 hour(s)).    Social History   Tobacco Use  Smoking Status Former Smoker  . Packs/day: 0.50  . Years: 30.00  . Pack years: 15.00  . Types: Cigarettes  Smokeless Tobacco Former Systems developer  . Types: Snuff  Tobacco Comment   USING NICORETTE GUM TO TRY AND STOP USING SMOKELESS TOBACCO    Goals Met:  Independence with exercise equipment Exercise tolerated well No report of cardiac concerns or symptoms Strength training completed today  Goals Unmet:  Not Applicable  Comments: Pt able to follow exercise prescription today without complaint.  Will continue to monitor for progression. Check out 915.   Dr. Kate Sable is Medical Director for [facility] Cardiac and Pulmonary Rehab.

## 2017-12-12 ENCOUNTER — Encounter (HOSPITAL_COMMUNITY)
Admission: RE | Admit: 2017-12-12 | Discharge: 2017-12-12 | Disposition: A | Discharge status: Home / Self Care | Payer: PPO | Source: Ambulatory Visit | Attending: Cardiology | Admitting: Cardiology

## 2017-12-12 DIAGNOSIS — Z951 Presence of aortocoronary bypass graft: Secondary | ICD-10-CM

## 2017-12-12 NOTE — Progress Notes (Signed)
Daily Session Note  Patient Details  Name: Chase Guerrero MRN: [id number] Date of Birth: 02/28/1945 Referring Provider:    Encounter Date: 12/12/2017  Check In: Session Check In - 12/12/17 0815      Check-In   Supervising physician immediately available to respond to emergencies  See telemetry face sheet for immediately available MD    Location  [facility]    Staff Present  Aundra Dubin, RN, BSN;Diane Coad, MS, EP, [facility], Exercise Physiologist;Amanda Zachery Conch, Exercise Physiologist    Medication changes reported      No    Fall or balance concerns reported     No    Warm-up and Cool-down  Performed as group-led instruction    Resistance Training Performed  Yes    VAD Patient?  No    PAD/SET Patient?  No      Pain Assessment   Currently in Pain?  No/denies    Pain Score  0-No pain    Multiple Pain Sites  No       Capillary Blood Glucose: No results found for this or any previous visit (from the past 24 hour(s)).    Social History   Tobacco Use  Smoking Status Former Smoker  . Packs/day: 0.50  . Years: 30.00  . Pack years: 15.00  . Types: Cigarettes  Smokeless Tobacco Former Systems developer  . Types: Snuff  Tobacco Comment   USING NICORETTE GUM TO TRY AND STOP USING SMOKELESS TOBACCO    Goals Met:  Independence with exercise equipment Exercise tolerated well No report of cardiac concerns or symptoms Strength training completed today  Goals Unmet:  Not Applicable  Comments: Pt able to follow exercise prescription today without complaint.  Will continue to monitor for progression. Check out 915.   Dr. Kate Sable is Medical Director for [facility] Cardiac and Pulmonary Rehab.

## 2017-12-14 ENCOUNTER — Encounter (HOSPITAL_COMMUNITY)
Admission: RE | Admit: 2017-12-14 | Discharge: 2017-12-14 | Disposition: A | Discharge status: Home / Self Care | Payer: PPO | Source: Ambulatory Visit | Attending: Cardiology | Admitting: Cardiology

## 2017-12-14 DIAGNOSIS — Z951 Presence of aortocoronary bypass graft: Secondary | ICD-10-CM

## 2017-12-14 NOTE — Progress Notes (Signed)
Daily Session Note  Patient Details  Name: Chase Guerrero MRN: [id number] Date of Birth: 29-May-1945 Referring Provider:    Encounter Date: 12/14/2017  Check In: Session Check In - 12/14/17 0815      Check-In   Supervising physician immediately available to respond to emergencies  See telemetry face sheet for immediately available MD    Location  [facility]    Staff Present  Aundra Dubin, RN, BSN;Diane Coad, MS, EP, [facility], Exercise Physiologist;Amanda Zachery Conch, Exercise Physiologist    Medication changes reported      No    Fall or balance concerns reported     No    Warm-up and Cool-down  Performed as group-led instruction    Resistance Training Performed  Yes    VAD Patient?  No    PAD/SET Patient?  No      Pain Assessment   Currently in Pain?  No/denies    Pain Score  0-No pain    Multiple Pain Sites  No       Capillary Blood Glucose: No results found for this or any previous visit (from the past 24 hour(s)).    Social History   Tobacco Use  Smoking Status Former Smoker  . Packs/day: 0.50  . Years: 30.00  . Pack years: 15.00  . Types: Cigarettes  Smokeless Tobacco Former Systems developer  . Types: Snuff  Tobacco Comment   USING NICORETTE GUM TO TRY AND STOP USING SMOKELESS TOBACCO    Goals Met:  Independence with exercise equipment Exercise tolerated well No report of cardiac concerns or symptoms Strength training completed today  Goals Unmet:  Not Applicable  Comments: Pt able to follow exercise prescription today without complaint.  Will continue to monitor for progression. Check out 915.   Dr. Kate Sable is Medical Director for [facility] Cardiac and Pulmonary Rehab.

## 2017-12-15 NOTE — Progress Notes (Signed)
Cardiac Individual Treatment Plan  Patient Details  Name: Chase Guerrero MRN: [id number] Date of Birth: 1946/01/22 Referring Provider:    Initial Encounter Date:   Visit Diagnosis: S/P CABG x 2  Patient's Home Medications on Admission:  Current Outpatient Medications:  .  acetaminophen (TYLENOL) 500 MG tablet, Take 2 tablets (1,000 mg total) by mouth every 6 (six) hours as needed for mild pain or fever., Disp: 30 tablet, Rfl: 0 .  aspirin EC 81 MG tablet, Take 81 mg by mouth daily., Disp: , Rfl:  .  atorvastatin (LIPITOR) 80 MG tablet, Take 1 tablet (80 mg total) by mouth daily., Disp: 90 tablet, Rfl: 3 .  cetirizine (ZYRTEC) 10 MG tablet, Take 10 mg by mouth daily as needed for allergies., Disp: , Rfl:  .  finasteride (PROSCAR) 5 MG tablet, Take 5 mg by mouth daily., Disp: , Rfl:  .  fluticasone (FLONASE) 50 MCG/ACT nasal spray, Place 1 spray into both nostrils daily as needed for allergies or rhinitis., Disp: , Rfl:  .  furosemide (LASIX) 20 MG tablet, Take 1 tablet (20 mg total) by mouth daily as needed. Take one tab by mouth daily x 3 days, then only as needed for swelling or edema (Patient taking differently: Take 20 mg by mouth every Monday, Wednesday, and Friday. Take one tab by mouth daily x 3 days, then only as needed for swelling or edema), Disp: 90 tablet, Rfl: 0 .  Menthol, Topical Analgesic, (BIOFREEZE EX), Apply 1 application topically daily as needed (pain)., Disp: , Rfl:  .  metoprolol tartrate (LOPRESSOR) 25 MG tablet, Take 12.5 mg by mouth 2 (two) times daily. , Disp: , Rfl:  .  nitroGLYCERIN (NITROSTAT) 0.4 MG SL tablet, Place 0.4 mg under the tongue every 5 (five) minutes as needed for chest pain. , Disp: , Rfl:  .  potassium chloride SA (K-DUR,KLOR-CON) 20 MEQ tablet, Take 1 tablet (20 mEq total) by mouth daily as needed (only on the day you take the as needed Lasix). (Patient taking differently: Take 20 mEq by mouth every Monday, Wednesday, and Friday. ), Disp: 90 tablet,  Rfl: 0 .  ranitidine (ZANTAC) 150 MG tablet, Take 150 mg by mouth daily as needed for heartburn., Disp: , Rfl:  .  Soft Lens Products (SENSITIVE EYES SALINE) SOLN, Place 1 drop into both eyes 3 (three) times daily., Disp: , Rfl:   Past Medical History: Past Medical History:  Diagnosis Date  . Arthritis   . Atrial fibrillation ([place])   . Cancer ([place])    skin  . Coronary artery disease   . Enlarged prostate   . GERD (gastroesophageal reflux disease)   . Hypertension   . Myocardial infarction ([facility])    mild  . Pleural effusion   . Sleep apnea    not wearing  cpap  not worn in 9-10 yrs    Tobacco Use: Social History   Tobacco Use  Smoking Status Former Smoker  . Packs/day: 0.50  . Years: 30.00  . Pack years: 15.00  . Types: Cigarettes  Smokeless Tobacco Former Systems developer  . Types: Snuff  Tobacco Comment   USING NICORETTE GUM TO TRY AND STOP USING SMOKELESS TOBACCO    Labs: Recent Review Flowsheet Data    Labs for ITP Cardiac and Pulmonary Rehab Latest Ref Rng & Units 06/27/2017 06/27/2017 06/28/2017 06/29/2017 09/20/2017   Cholestrol 100 - 199 mg/dL - - - - 121   LDLCALC 0 - 99 mg/dL - - - - 61  HDL >39 mg/dL - - - - 43   Trlycerides 0 - 149 mg/dL - - - - 85   Hemoglobin A1c 4.8 - 5.6 % - - - - -   PHART 7.350 - 7.450 - 7.350 - - -   PCO2ART 32.0 - 48.0 mmHg - 39.9 - - -   HCO3 20.0 - 28.0 mmol/L - 22.0 - - -   TCO2 22 - 32 mmol/L _0 -   ACIDBASEDEF 0.0 - 2.0 mmol/L - 3.0(H) - - -   O2SAT % - 99.0 - - -      Capillary Blood Glucose: Lab Results  Component Value Date   GLUCAP 96 07/01/2017   GLUCAP 124 (H) 06/30/2017   GLUCAP 111 (H) 06/30/2017   GLUCAP 116 (H) 06/30/2017   GLUCAP 122 (H) 06/30/2017     Exercise Target Goals: Exercise Program Goal: Individual exercise prescription set using results from initial 6 min walk test and THRR while considering  patient's activity barriers and safety.   Exercise Prescription Goal: Starting with aerobic activity  30 plus minutes a day, 3 days per week for initial exercise prescription. Provide home exercise prescription and guidelines that participant acknowledges understanding prior to discharge.  Activity Barriers & Risk Stratification: Activity Barriers & Cardiac Risk Stratification - 10/17/17 0953      Activity Barriers & Cardiac Risk Stratification   Activity Barriers  Back Problems   shoulders worn out   Cardiac Risk Stratification  High       6 Minute Walk: 6 Minute Walk    Row Name 10/17/17 0855         6 Minute Walk   Phase  Initial     Distance  1300 feet     Walk Time  6 minutes     # of Rest Breaks  0     MPH  2.46     METS  2.89     RPE  9     Perceived Dyspnea   7     VO2 Peak  9.27     Resting HR  63 bpm     Resting BP  110/50     Resting Oxygen Saturation   94 %     Exercise Oxygen Saturation  during 6 min walk  87 %     Max Ex. HR  87 bpm     Max Ex. BP  128/58     2 Minute Post BP  116/58        Oxygen Initial Assessment:   Oxygen Re-Evaluation:   Oxygen Discharge (Final Oxygen Re-Evaluation):   Initial Exercise Prescription:   Perform Capillary Blood Glucose checks as needed.  Exercise Prescription Changes:  Exercise Prescription Changes    Row Name 11/08/17 1200 11/29/17 1100           Response to Exercise   Blood Pressure (Admit)  102/50  108/60      Blood Pressure (Exercise)  124/60  124/64      Blood Pressure (Exit)  102/60  104/58      Heart Rate (Admit)  74 bpm  65 bpm      Heart Rate (Exercise)  87 bpm  92 bpm      Heart Rate (Exit)  83 bpm  78 bpm      Rating of Perceived Exertion (Exercise)  12  12      Comments  -  increase in overall MET level  Duration  Continue with 30 min of aerobic exercise without signs/symptoms of physical distress.  Continue with 30 min of aerobic exercise without signs/symptoms of physical distress.      Intensity  THRR unchanged  THRR unchanged        Progression   Progression  Continue to  progress workloads to maintain intensity without signs/symptoms of physical distress.  Continue to progress workloads to maintain intensity without signs/symptoms of physical distress.      Average METs  2.17  2.8        Resistance Training   Weight  1  3      Reps  10-15  10-15        Treadmill   MPH  1.5  2      Grade  0  0      Minutes  17  17      METs  2.14  2.5        NuStep   Level  2  2      SPM  95  104      Minutes  22  22      METs  2.2  3.1         Exercise Comments:  Exercise Comments    Row Name 10/24/17 1618 11/15/17 0800 12/14/17 1432       Exercise Comments  Patient has just starter. Has done well his first few visits.   Patient continues to do well in the program and handle all progressions with ease.   Patient is always willing to work hard to increase workload and MET levels. He is feeling stronger and feels he is able to do more around the house and to help his family members.         Exercise Goals and Review:  Exercise Goals    Row Name 10/17/17 0954             Exercise Goals   Increase Physical Activity  Yes       Intervention  Provide advice, education, support and counseling about physical activity/exercise needs.       Expected Outcomes  Short Term: Attend rehab on a regular basis to increase amount of physical activity.;Long Term: Add in home exercise to make exercise part of routine and to increase amount of physical activity.;Long Term: Exercising regularly at least 3-5 days a week.       Increase Strength and Stamina  Yes       Intervention  Provide advice, education, support and counseling about physical activity/exercise needs.       Expected Outcomes  Short Term: Increase workloads from initial exercise prescription for resistance, speed, and METs.;Long Term: Improve cardiorespiratory fitness, muscular endurance and strength as measured by increased METs and functional capacity (6MWT)       Able to understand and use rate of perceived  exertion (RPE) scale  Yes       Intervention  Provide education and explanation on how to use RPE scale       Expected Outcomes  Short Term: Able to use RPE daily in rehab to express subjective intensity level;Long Term:  Able to use RPE to guide intensity level when exercising independently       Knowledge and understanding of Target Heart Rate Range (THRR)  Yes       Intervention  Provide education and explanation of THRR including how the numbers were predicted and where they are located for reference  Expected Outcomes  Short Term: Able to use daily as guideline for intensity in rehab;Long Term: Able to use THRR to govern intensity when exercising independently       Able to check pulse independently  Yes       Intervention  Provide education and demonstration on how to check pulse in carotid and radial arteries.       Expected Outcomes  Short Term: Able to explain why pulse checking is important during independent exercise;Long Term: Able to check pulse independently and accurately       Understanding of Exercise Prescription  Yes       Intervention  Provide education, explanation, and written materials on patient's individual exercise prescription       Expected Outcomes  Short Term: Able to explain program exercise prescription;Long Term: Able to explain home exercise prescription to exercise independently          Exercise Goals Re-Evaluation : Exercise Goals Re-Evaluation    Row Name 10/24/17 1614 11/15/17 0757 12/14/17 1431         Exercise Goal Re-Evaluation   Exercise Goals Review  Increase Physical Activity;Increase Strength and Stamina;Understanding of Exercise Prescription;Knowledge and understanding of Target Heart Rate Range (THRR)  Increase Physical Activity;Increase Strength and Stamina;Understanding of Exercise Prescription;Knowledge and understanding of Target Heart Rate Range (THRR)  Increase Physical Activity;Increase Strength and Stamina;Understanding of Exercise  Prescription;Knowledge and understanding of Target Heart Rate Range (THRR)     Comments  Patient just started the program. He completed 2 visit. Will continue to monitor his progress  Patient is still pretty new to the program, having completed 8 sessions due to having to be out some. He has tolerated all of his progressions well and getting stronger everyday. Has already been able to get back in to fishing and is very excited about that.   Patient has continued to work hard and progress throught the program. He has attended regularly to help build stamina. He is up to 2.3 MPH on the treadmill with ease. We will continue to monitor his progress.      Expected Outcomes  To increase in functional capicity and ADL's. Wants to get back to doing the things he wants to do.   Increase in functional capacity and ablilty to get back in to doing the things that he wants to do such as golf and fishing.   Increase in functional capacity and ablilty to get back in to doing the things that he wants to do such as golf and fishing.          Discharge Exercise Prescription (Final Exercise Prescription Changes): Exercise Prescription Changes - 11/29/17 1100      Response to Exercise   Blood Pressure (Admit)  108/60    Blood Pressure (Exercise)  124/64    Blood Pressure (Exit)  104/58    Heart Rate (Admit)  65 bpm    Heart Rate (Exercise)  92 bpm    Heart Rate (Exit)  78 bpm    Rating of Perceived Exertion (Exercise)  12    Comments  increase in overall MET level    Duration  Continue with 30 min of aerobic exercise without signs/symptoms of physical distress.    Intensity  THRR unchanged      Progression   Progression  Continue to progress workloads to maintain intensity without signs/symptoms of physical distress.    Average METs  2.8      Resistance Training   Weight  3  Reps  10-15      Treadmill   MPH  2    Grade  0    Minutes  17    METs  2.5      NuStep   Level  2    SPM  104    Minutes   22    METs  3.1       Nutrition:  Target Goals: Understanding of nutrition guidelines, daily intake of sodium <1533m, cholesterol <2049m calories 30% from fat and 7% or less from saturated fats, daily to have 5 or more servings of fruits and vegetables.  Biometrics: Pre Biometrics - 10/17/17 0857      Pre Biometrics   Height  _0  (1.778 m)    Waist Circumference  40.5 inches    Hip Circumference  36 inches    Waist to Hip Ratio  1.12 %    Triceps Skinfold  4 mm    % Body Fat  22.4 %    Grip Strength  34.9 kg    Flexibility  0 in   bulging discs in back    Single Leg Stand  8 seconds        Nutrition Therapy Plan and Nutrition Goals: Nutrition Therapy & Goals - 12/15/17 0743      Nutrition Therapy   RD appointment deferred  Yes      Personal Nutrition Goals   Additional Goals?  No    Comments  Patient continues to say he is eating a heart healthy diet. Will continue to monitor.        Nutrition Assessments: Nutrition Assessments - 10/17/17 1008      MEDFICTS Scores   Pre Score  21       Nutrition Goals Re-Evaluation:   Nutrition Goals Discharge (Final Nutrition Goals Re-Evaluation):   Psychosocial: Target Goals: Acknowledge presence or absence of significant depression and/or stress, maximize coping skills, provide positive support system. Participant is able to verbalize types and ability to use techniques and skills needed for reducing stress and depression.  Initial Review & Psychosocial Screening: Initial Psych Review & Screening - 10/17/17 1007      Initial Review   Current issues with  None Identified      Family Dynamics   Good Support System?  Yes      Barriers   Psychosocial barriers to participate in program  There are no identifiable barriers or psychosocial needs.      Screening Interventions   Interventions  Encouraged to exercise    Expected Outcomes  Short Term goal: Identification and review with participant of any Quality of  Life or Depression concerns found by scoring the questionnaire.;Long Term goal: The participant improves quality of Life and PHQ9 Scores as seen by post scores and/or verbalization of changes       Quality of Life Scores: Quality of Life - 10/17/17 0858      Quality of Life   Select  Quality of Life      Quality of Life Scores   Health/Function Pre  15.43 %    Socioeconomic Pre  23.17 %    Psych/Spiritual Pre  24 %    Family Pre  19.5 %    GLOBAL Pre  19.27 %      Scores of 19 and below usually indicate a poorer quality of life in these areas.  A difference of  2-3 points is a clinically meaningful difference.  A difference of 2-3 points in the  total score of the Quality of Life Index has been associated with significant improvement in overall quality of life, self-image, physical symptoms, and general health in studies assessing change in quality of life.  PHQ-9: Recent Review Flowsheet Data    Depression screen [facility] 2/9 10/17/2017   Decreased Interest 0   Down, Depressed, Hopeless 0   PHQ - 2 Score 0   Altered sleeping 1   Tired, decreased energy 1   Change in appetite 0   Feeling bad or failure about yourself  0   Trouble concentrating 1   Moving slowly or fidgety/restless 0   Suicidal thoughts 0   PHQ-9 Score 3   Difficult doing work/chores Somewhat difficult     Interpretation of Total Score  Total Score Depression Severity:  1-4 = Minimal depression, 5-9 = Mild depression, 10-14 = Moderate depression, 15-19 = Moderately severe depression, 20-27 = Severe depression   Psychosocial Evaluation and Intervention: Psychosocial Evaluation - 10/17/17 1007      Psychosocial Evaluation & Interventions   Interventions  Encouraged to exercise with the program and follow exercise prescription    Continue Psychosocial Services   No Follow up required       Psychosocial Re-Evaluation: Psychosocial Re-Evaluation    [place] Name 11/17/17 0753 12/15/17 0745           Psychosocial  Re-Evaluation   Current issues with  None Identified  None Identified      Comments  Patient's initial QOL score was 19.27 and his PHQ-9 score was 3 with no issues identified.  Patient's initial QOL score was 19.27 and his PHQ-9 score was 3 with no issues identified.      Expected Outcomes  Patient will not have any psychosocial issues identified at discharge.   Patient will not have any psychosocial issues identified at discharge.       Interventions  Stress management education;Encouraged to attend Cardiac Rehabilitation for the exercise;Relaxation education  Stress management education;Encouraged to attend Cardiac Rehabilitation for the exercise;Relaxation education      Continue Psychosocial Services   No Follow up required  No Follow up required         Psychosocial Discharge (Final Psychosocial Re-Evaluation): Psychosocial Re-Evaluation - 12/15/17 0745      Psychosocial Re-Evaluation   Current issues with  None Identified    Comments  Patient's initial QOL score was 19.27 and his PHQ-9 score was 3 with no issues identified.    Expected Outcomes  Patient will not have any psychosocial issues identified at discharge.     Interventions  Stress management education;Encouraged to attend Cardiac Rehabilitation for the exercise;Relaxation education    Continue Psychosocial Services   No Follow up required       Vocational Rehabilitation: Provide vocational rehab assistance to qualifying candidates.   Vocational Rehab Evaluation & Intervention: Vocational Rehab - 10/17/17 1010      Initial Vocational Rehab Evaluation & Intervention   Assessment shows need for Vocational Rehabilitation  No       Education: Education Goals: Education classes will be provided on a weekly basis, covering required topics. Participant will state understanding/return demonstration of topics presented.  Learning Barriers/Preferences: Learning Barriers/Preferences - 10/17/17 1008      Learning  Barriers/Preferences   Learning Barriers  None    Learning Preferences  Pictoral;Video;Written Material       Education Topics: Hypertension, Hypertension Reduction -Define heart disease and high blood pressure. Discus how high blood pressure affects the body and ways to  reduce high blood pressure.   CARDIAC REHAB PHASE II EXERCISE from 12/14/2017 in [place]  Date  12/14/17  Educator  D. Coad  Instruction Review Code  2- Demonstrated Understanding      Exercise and Your Heart -Discuss why it is important to exercise, the FITT principles of exercise, normal and abnormal responses to exercise, and how to exercise safely.   Angina -Discuss definition of angina, causes of angina, treatment of angina, and how to decrease risk of having angina.   Cardiac Medications -Review what the following cardiac medications are used for, how they affect the body, and side effects that may occur when taking the medications.  Medications include Aspirin, Beta blockers, calcium channel blockers, ACE Inhibitors, angiotensin receptor blockers, diuretics, digoxin, and antihyperlipidemics.   Congestive Heart Failure -Discuss the definition of CHF, how to live with CHF, the signs and symptoms of CHF, and how keep track of weight and sodium intake.   Heart Disease and Intimacy -Discus the effect sexual activity has on the heart, how changes occur during intimacy as we age, and safety during sexual activity.   Smoking Cessation / COPD -Discuss different methods to quit smoking, the health benefits of quitting smoking, and the definition of COPD.   CARDIAC REHAB PHASE II EXERCISE from 12/14/2017 in [place]  Date  10/26/17  Educator  Etheleen Mayhew  Instruction Review Code  2- Demonstrated Understanding      Nutrition I: Fats -Discuss the types of cholesterol, what cholesterol does to the heart, and how cholesterol levels can be controlled.   Nutrition  II: Labels -Discuss the different components of food labels and how to read food label   CARDIAC REHAB PHASE II EXERCISE from 12/14/2017 in [place]  Date  11/09/17  Educator  Etheleen Mayhew  Instruction Review Code  2- Demonstrated Understanding      Heart Parts/Heart Disease and PAD -Discuss the anatomy of the heart, the pathway of blood circulation through the heart, and these are affected by heart disease.   CARDIAC REHAB PHASE II EXERCISE from 12/14/2017 in [place]  Date  11/16/17  Educator  Etheleen Mayhew   Instruction Review Code  2- Demonstrated Understanding      Stress I: Signs and Symptoms -Discuss the causes of stress, how stress may lead to anxiety and depression, and ways to limit stress.   CARDIAC REHAB PHASE II EXERCISE from 12/14/2017 in [place]  Date  11/23/17  Educator  D.Coad  Instruction Review Code  2- Demonstrated Understanding      Stress II: Relaxation -Discuss different types of relaxation techniques to limit stress.   CARDIAC REHAB PHASE II EXERCISE from 12/14/2017 in [place]  Date  11/30/17  Educator  D. Coad  Instruction Review Code  2- Demonstrated Understanding      Warning Signs of Stroke / TIA -Discuss definition of a stroke, what the signs and symptoms are of a stroke, and how to identify when someone is having stroke.   CARDIAC REHAB PHASE II EXERCISE from 12/14/2017 in [place]  Date  12/07/17  Educator  Etheleen Mayhew  Instruction Review Code  2- Demonstrated Understanding      Knowledge Questionnaire Score: Knowledge Questionnaire Score - 10/17/17 1009      Knowledge Questionnaire Score   Pre Score  24/28       Core Components/Risk Factors/Patient Goals at Admission: Personal Goals and Risk  Factors at Admission - 10/17/17 1010      Core Components/Risk Factors/Patient Goals on Admission    Weight Management   Weight Maintenance    Personal Goal Other  Yes    Personal Goal  Be able to function during ADL's and be able to do things I want to do.     Intervention  Attend CR 3 x week and supplement at home exercise 2 x week.     Expected Outcomes  Reach personal goals       Core Components/Risk Factors/Patient Goals Review:  Goals and Risk Factor Review    Row Name 11/17/17 0749 12/15/17 0743           Core Components/Risk Factors/Patient Goals Review   Personal Goals Review  Weight Management/Obesity Being able to function; do ADL's do things I want to do; live a good life.   Weight Management/Obesity Be able to function; do ADL's ; do things I want to do; live a good life.       Review  Patient has completed 9 sessions gaining 5 lbs since he started the program. His attendance has been inconsitent but overall, he is doing well in the program with progression. He is still knew to the program but says he feels more like doing more now. He was able to go fishing last week without getting tired. Will continue to monitor for progress.   Patient has completed 20 sessions losing 1 lb since his last 30 day review. He is doing well in the program with progression and his attendance is consistent. He says he is feeling stronger and is able to do his ADL's and the things he wants. 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 He is pleased with his progress in the program. Will continue to monitor for progress.       Expected Outcomes  Patient will continue to attend sesisons more consistently and complete the program meeting his personal goals.   Patient will continue to attend sesisons more consistently and complete the program meeting his personal goals.          Core Components/Risk Factors/Patient Goals at Discharge (Final Review):  Goals and Risk Factor Review - 12/15/17 0743      Core Components/Risk Factors/Patient Goals Review   Personal Goals Review  Weight Management/Obesity   Be able to  function; do ADL's ; do things I want to do; live a good life.    Review  Patient has completed 20 sessions losing 1 lb since his last 30 day review. He is doing well in the program with progression and his attendance is consistent. He says he is feeling stronger and is able to do his ADL's and the things he wants. 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 He is pleased with his progress in the program. Will continue to monitor for progress.     Expected Outcomes  Patient will continue to attend sesisons more consistently and complete the program meeting his personal goals.        ITP Comments: ITP Comments    Row Name 10/26/17 0746           ITP Comments  Patient is new to program. He has completed 2 sessions. Will continue to monitor for progress.           Comments: ITP REVIEW Patient doing well in the program. Will continue to monitor for progress.

## 2017-12-16 ENCOUNTER — Encounter (HOSPITAL_COMMUNITY)
Admission: RE | Admit: 2017-12-16 | Discharge: 2017-12-16 | Disposition: A | Discharge status: Home / Self Care | Payer: PPO | Source: Ambulatory Visit | Attending: Cardiology | Admitting: Cardiology

## 2017-12-16 DIAGNOSIS — Z951 Presence of aortocoronary bypass graft: Secondary | ICD-10-CM

## 2017-12-16 NOTE — Progress Notes (Signed)
Daily Session Note  Patient Details  Name: FONTAINE HEHL MRN: [id number] Date of Birth: 08-20-1945 Referring Provider:    Encounter Date: 12/16/2017  Check In: Session Check In - 12/16/17 0815      Check-In   Supervising physician immediately available to respond to emergencies  See telemetry face sheet for immediately available MD    Location  [facility]    Staff Present  Aundra Dubin, RN, BSN;Diane Coad, MS, EP, [facility], Exercise Physiologist;Dorethea Strubel Zachery Conch, Exercise Physiologist    Medication changes reported      No    Fall or balance concerns reported     No    Warm-up and Cool-down  Performed as group-led instruction    Resistance Training Performed  Yes    PAD/SET Patient?  No      Pain Assessment   Currently in Pain?  No/denies    Pain Score  0-No pain    Multiple Pain Sites  No       Capillary Blood Glucose: No results found for this or any previous visit (from the past 24 hour(s)).    Social History   Tobacco Use  Smoking Status Former Smoker  . Packs/day: 0.50  . Years: 30.00  . Pack years: 15.00  . Types: Cigarettes  Smokeless Tobacco Former Systems developer  . Types: Snuff  Tobacco Comment   USING NICORETTE GUM TO TRY AND STOP USING SMOKELESS TOBACCO    Goals Met:  Independence with exercise equipment Exercise tolerated well No report of cardiac concerns or symptoms Strength training completed today  Goals Unmet:  Not Applicable  Comments: Pt able to follow exercise prescription today without complaint.  Will continue to monitor for progression. Check out 9:30.   Dr. Kate Sable is Medical Director for [facility] Cardiac and Pulmonary Rehab.

## 2017-12-19 ENCOUNTER — Encounter (HOSPITAL_COMMUNITY)
Admission: RE | Admit: 2017-12-19 | Discharge: 2017-12-19 | Disposition: A | Discharge status: Home / Self Care | Payer: PPO | Source: Ambulatory Visit | Attending: Cardiology | Admitting: Cardiology

## 2017-12-19 DIAGNOSIS — Z951 Presence of aortocoronary bypass graft: Secondary | ICD-10-CM

## 2017-12-19 NOTE — Progress Notes (Signed)
Daily Session Note  Patient Details  Name: Chase Guerrero MRN: [id number] Date of Birth: 15-Mar-1945 Referring Provider:    Encounter Date: 12/19/2017  Check In: Session Check In - 12/19/17 0816      Check-In   Supervising physician immediately available to respond to emergencies  See telemetry face sheet for immediately available MD    Location  [facility]    Staff Present  Russella Dar, MS, EP, [facility], Exercise Physiologist;Debra Wynetta Emery, RN, BSN    Medication changes reported      No    Fall or balance concerns reported     No    Tobacco Cessation  No Change    Warm-up and Cool-down  Performed as group-led instruction    Resistance Training Performed  Yes    VAD Patient?  No    PAD/SET Patient?  No      Pain Assessment   Currently in Pain?  No/denies    Pain Score  0-No pain    Multiple Pain Sites  No       Capillary Blood Glucose: No results found for this or any previous visit (from the past 24 hour(s)).    Social History   Tobacco Use  Smoking Status Former Smoker  . Packs/day: 0.50  . Years: 30.00  . Pack years: 15.00  . Types: Cigarettes  Smokeless Tobacco Former Systems developer  . Types: Snuff  Tobacco Comment   USING NICORETTE GUM TO TRY AND STOP USING SMOKELESS TOBACCO    Goals Met:  Independence with exercise equipment Exercise tolerated well Personal goals reviewed No report of cardiac concerns or symptoms Strength training completed today  Goals Unmet:  Not Applicable  Comments: Check out: 0915   Dr. Kate Sable is Medical Director for [facility] Card[facility] and Pul[facility] Rehab.

## 2017-12-21 ENCOUNTER — Encounter (HOSPITAL_COMMUNITY)
Admission: RE | Admit: 2017-12-21 | Discharge: 2017-12-21 | Disposition: A | Discharge status: Home / Self Care | Payer: PPO | Source: Ambulatory Visit | Attending: Cardiology | Admitting: Cardiology

## 2017-12-21 DIAGNOSIS — Z951 Presence of aortocoronary bypass graft: Secondary | ICD-10-CM

## 2017-12-21 NOTE — Progress Notes (Signed)
Daily Session Note  Patient Details  Name: Chase Guerrero MRN: [id number] Date of Birth: [birth year] Referring Provider:    Encounter Date: 12/21/2017  Check In: Session Check In - 12/21/17 0815      Check-In   Supervising physician immediately available to respond to emergencies  See telemetry face sheet for immediately available MD    Location  [facility]    Staff Present  Russella Dar, MS, EP, [facility], Exercise Physiologist;Bashir Marchetti Wynetta Emery, RN, BSN    Medication changes reported      No    Fall or balance concerns reported     No    Warm-up and Cool-down  Performed as group-led instruction    Resistance Training Performed  Yes    VAD Patient?  No    PAD/SET Patient?  No      Pain Assessment   Currently in Pain?  No/denies    Pain Score  0-No pain    Multiple Pain Sites  No       Capillary Blood Glucose: No results found for this or any previous visit (from the past 24 hour(s)).    Social History   Tobacco Use  Smoking Status Former Smoker  . Packs/day: 0.50  . Years: 30.00  . Pack years: 15.00  . Types: Cigarettes  Smokeless Tobacco Former Systems developer  . Types: Snuff  Tobacco Comment   USING NICORETTE GUM TO TRY AND STOP USING SMOKELESS TOBACCO    Goals Met:  Independence with exercise equipment Exercise tolerated well No report of cardiac concerns or symptoms Strength training completed today  Goals Unmet:  Not Applicable  Comments: Pt able to follow exercise prescription today without complaint.  Will continue to monitor for progression. Check out 915.   Dr. Kate Sable is Medical Director for [facility] Cardiac and Pulmonary Rehab.

## 2017-12-23 ENCOUNTER — Encounter (HOSPITAL_COMMUNITY)
Admission: RE | Admit: 2017-12-23 | Discharge: 2017-12-23 | Disposition: A | Discharge status: Home / Self Care | Payer: PPO | Source: Ambulatory Visit | Attending: Cardiology | Admitting: Cardiology

## 2017-12-23 DIAGNOSIS — Z951 Presence of aortocoronary bypass graft: Secondary | ICD-10-CM | POA: Diagnosis not present

## 2017-12-23 NOTE — Progress Notes (Signed)
Daily Session Note  Patient Details  Name: TEJ MURDAUGH MRN: [id number] Date of Birth: Jul 13, 1945 Referring Provider:    Encounter Date: 12/23/2017  Check In: Session Check In - 12/23/17 0815      Check-In   Supervising physician immediately available to respond to emergencies  See telemetry face sheet for immediately available MD    Location  [facility]    Staff Present  Russella Dar, MS, EP, [facility], Exercise Physiologist;Daziya Redmond, Exercise Physiologist;Debra Wynetta Emery, RN, BSN    Medication changes reported      No    Fall or balance concerns reported     No    Tobacco Cessation  No Change    Warm-up and Cool-down  Performed as group-led instruction    Resistance Training Performed  Yes    VAD Patient?  No    PAD/SET Patient?  No      Pain Assessment   Currently in Pain?  No/denies    Pain Score  0-No pain    Multiple Pain Sites  No       Capillary Blood Glucose: No results found for this or any previous visit (from the past 24 hour(s)).    Social History   Tobacco Use  Smoking Status Former Smoker  . Packs/day: 0.50  . Years: 30.00  . Pack years: 15.00  . Types: Cigarettes  Smokeless Tobacco Former Systems developer  . Types: Snuff  Tobacco Comment   USING NICORETTE GUM TO TRY AND STOP USING SMOKELESS TOBACCO    Goals Met:  Independence with exercise equipment Exercise tolerated well No report of cardiac concerns or symptoms Strength training completed today  Goals Unmet:  Not Applicable  Comments: Pt able to follow exercise prescription today without complaint.  Will continue to monitor for progression. Check out 9:15.   Dr. Kate Sable is Medical Director for [facility] Cardiac and Pulmonary Rehab.

## 2017-12-26 ENCOUNTER — Encounter (HOSPITAL_COMMUNITY)
Admission: RE | Admit: 2017-12-26 | Discharge: 2017-12-26 | Disposition: A | Discharge status: Home / Self Care | Payer: PPO | Source: Ambulatory Visit | Attending: Cardiology | Admitting: Cardiology

## 2017-12-26 DIAGNOSIS — Z951 Presence of aortocoronary bypass graft: Secondary | ICD-10-CM | POA: Diagnosis not present

## 2017-12-26 NOTE — Progress Notes (Signed)
Daily Session Note  Patient Details  Name: JALYN ROSERO MRN: [id number] Date of Birth: December 15, 1945 Referring Provider:    Encounter Date: 12/26/2017  Check In: Session Check In - 12/26/17 0815      Check-In   Supervising physician immediately available to respond to emergencies  See telemetry face sheet for immediately available MD    Location  [facility]    Staff Present  Russella Dar, MS, EP, [facility], Exercise Physiologist;Gayatri Teasdale, Exercise Physiologist;Debra Wynetta Emery, RN, BSN    Medication changes reported      No    Fall or balance concerns reported     No    Tobacco Cessation  No Change    Warm-up and Cool-down  Performed as group-led instruction    Resistance Training Performed  Yes    VAD Patient?  No    PAD/SET Patient?  No      Pain Assessment   Currently in Pain?  No/denies    Pain Score  0-No pain    Multiple Pain Sites  No       Capillary Blood Glucose: No results found for this or any previous visit (from the past 24 hour(s)).    Social History   Tobacco Use  Smoking Status Former Smoker  . Packs/day: 0.50  . Years: 30.00  . Pack years: 15.00  . Types: Cigarettes  Smokeless Tobacco Former Systems developer  . Types: Snuff  Tobacco Comment   USING NICORETTE GUM TO TRY AND STOP USING SMOKELESS TOBACCO    Goals Met:  Independence with exercise equipment Exercise tolerated well No report of cardiac concerns or symptoms Strength training completed today  Goals Unmet:  Not Applicable  Comments: Pt able to follow exercise prescription today without complaint.  Will continue to monitor for progression. Check out 9:15.   Dr. Kate Sable is Medical Director for [facility] Cardiac and Pulmonary Rehab.

## 2017-12-28 ENCOUNTER — Encounter (HOSPITAL_COMMUNITY)
Admission: RE | Admit: 2017-12-28 | Discharge: 2017-12-28 | Disposition: A | Discharge status: Home / Self Care | Payer: PPO | Source: Ambulatory Visit | Attending: Cardiology | Admitting: Cardiology

## 2017-12-28 DIAGNOSIS — Z951 Presence of aortocoronary bypass graft: Secondary | ICD-10-CM

## 2017-12-28 NOTE — Progress Notes (Signed)
Daily Session Note  Patient Details  Name: JONNATHAN BIRMAN MRN: [id number] Date of Birth: 08/07/1945 Referring Provider:    Encounter Date: 12/28/2017  Check In: Session Check In - 12/28/17 0815      Check-In   Supervising physician immediately available to respond to emergencies  See telemetry face sheet for immediately available MD    Location  [facility]    Staff Present  Benay Pike, Exercise Physiologist;Debra Wynetta Emery, RN, BSN;Other    Medication changes reported      No    Fall or balance concerns reported     No    Tobacco Cessation  No Change    Warm-up and Cool-down  Performed as group-led instruction    Resistance Training Performed  Yes    VAD Patient?  No    PAD/SET Patient?  No      Pain Assessment   Currently in Pain?  No/denies    Pain Score  0-No pain    Multiple Pain Sites  No       Capillary Blood Glucose: No results found for this or any previous visit (from the past 24 hour(s)).    Social History   Tobacco Use  Smoking Status Former Smoker  . Packs/day: 0.50  . Years: 30.00  . Pack years: 15.00  . Types: Cigarettes  Smokeless Tobacco Former Systems developer  . Types: Snuff  Tobacco Comment   USING NICORETTE GUM TO TRY AND STOP USING SMOKELESS TOBACCO    Goals Met:  Independence with exercise equipment Exercise tolerated well No report of cardiac concerns or symptoms Strength training completed today  Goals Unmet:  Not Applicable  Comments: Pt able to follow exercise prescription today without complaint.  Will continue to monitor for progression. Check out 9:15.   Dr. Kate Sable is Medical Director for [facility] Cardiac and Pulmonary Rehab.

## 2017-12-30 ENCOUNTER — Encounter (HOSPITAL_COMMUNITY)
Admission: RE | Admit: 2017-12-30 | Discharge: 2017-12-30 | Disposition: A | Discharge status: Home / Self Care | Payer: PPO | Source: Ambulatory Visit | Attending: Cardiology | Admitting: Cardiology

## 2017-12-30 DIAGNOSIS — Z951 Presence of aortocoronary bypass graft: Secondary | ICD-10-CM | POA: Diagnosis not present

## 2017-12-30 NOTE — Progress Notes (Signed)
Daily Session Note  Patient Details  Name: TAKUMI DIN MRN: [id number] Date of Birth: 1945/07/21 Referring Provider:    Encounter Date: 12/30/2017  Check In: Session Check In - 12/30/17 0815      Check-In   Supervising physician immediately available to respond to emergencies  See telemetry face sheet for immediately available MD    Location  [facility]    Staff Present  Benay Pike, Exercise Physiologist;Debra Wynetta Emery, RN, BSN;Diane Coad, MS, EP, [facility], Exercise Physiologist    Medication changes reported      No    Fall or balance concerns reported     No    Tobacco Cessation  No Change    Warm-up and Cool-down  Performed as group-led instruction    Resistance Training Performed  Yes    VAD Patient?  No    PAD/SET Patient?  No      Pain Assessment   Currently in Pain?  No/denies    Pain Score  0-No pain    Multiple Pain Sites  No       Capillary Blood Glucose: No results found for this or any previous visit (from the past 24 hour(s)).    Social History   Tobacco Use  Smoking Status Former Smoker  . Packs/day: 0.50  . Years: 30.00  . Pack years: 15.00  . Types: Cigarettes  Smokeless Tobacco Former Systems developer  . Types: Snuff  Tobacco Comment   USING NICORETTE GUM TO TRY AND STOP USING SMOKELESS TOBACCO    Goals Met:  Independence with exercise equipment Exercise tolerated well No report of cardiac concerns or symptoms Strength training completed today  Goals Unmet:  Not Applicable  Comments: Pt able to follow exercise prescription today without complaint.  Will continue to monitor for progression. Check out 9:15.   Dr. Kate Sable is Medical Director for [facility] Cardiac and Pulmonary Rehab.

## 2018-01-02 ENCOUNTER — Encounter (HOSPITAL_COMMUNITY)
Admission: RE | Admit: 2018-01-02 | Discharge: 2018-01-02 | Disposition: A | Discharge status: Home / Self Care | Payer: PPO | Source: Ambulatory Visit | Attending: Cardiology | Admitting: Cardiology

## 2018-01-02 DIAGNOSIS — Z951 Presence of aortocoronary bypass graft: Secondary | ICD-10-CM

## 2018-01-02 NOTE — Progress Notes (Signed)
Daily Session Note  Patient Details  Name: Chase Guerrero MRN: [id number] Date of Birth: Nov 11, 1945 Referring Provider:    Encounter Date: 01/02/2018  Check In: Session Check In - 01/02/18 0815      Check-In   Supervising physician immediately available to respond to emergencies  See telemetry face sheet for immediately available MD    Location  [facility]    Staff Present  Benay Pike, Exercise Physiologist;Debra Wynetta Emery, RN, BSN    Medication changes reported      No    Fall or balance concerns reported     No    Tobacco Cessation  No Change    Warm-up and Cool-down  Performed as group-led instruction    Resistance Training Performed  Yes    VAD Patient?  No    PAD/SET Patient?  No      Pain Assessment   Currently in Pain?  No/denies    Pain Score  0-No pain    Multiple Pain Sites  No       Capillary Blood Glucose: No results found for this or any previous visit (from the past 24 hour(s)).    Social History   Tobacco Use  Smoking Status Former Smoker  . Packs/day: 0.50  . Years: 30.00  . Pack years: 15.00  . Types: Cigarettes  Smokeless Tobacco Former Systems developer  . Types: Snuff  Tobacco Comment   USING NICORETTE GUM TO TRY AND STOP USING SMOKELESS TOBACCO    Goals Met:  Independence with exercise equipment Exercise tolerated well No report of cardiac concerns or symptoms Strength training completed today  Goals Unmet:  Not Applicable  Comments: Pt able to follow exercise prescription today without complaint.  Will continue to monitor for progression. Check out 9:15.   Dr. Kate Sable is Medical Director for [facility] Cardiac and Pulmonary Rehab.

## 2018-01-04 ENCOUNTER — Encounter (HOSPITAL_COMMUNITY)
Admission: RE | Admit: 2018-01-04 | Discharge: 2018-01-04 | Disposition: A | Discharge status: Home / Self Care | Payer: PPO | Source: Ambulatory Visit | Attending: Cardiology | Admitting: Cardiology

## 2018-01-04 DIAGNOSIS — Z951 Presence of aortocoronary bypass graft: Secondary | ICD-10-CM

## 2018-01-04 NOTE — Progress Notes (Signed)
Daily Session Note  Patient Details  Name: Chase Guerrero MRN: [id number] Date of Birth: 1945-08-06 Referring Provider:    Encounter Date: 01/04/2018  Check In: Session Check In - 01/04/18 0815      Check-In   Supervising physician immediately available to respond to emergencies  See telemetry face sheet for immediately available MD    Location  [facility]    Staff Present  Benay Pike, Exercise Physiologist;Debra Wynetta Emery, RN, BSN;Diane Coad, MS, EP, [facility], Exercise Physiologist    Medication changes reported      No    Fall or balance concerns reported     No    Tobacco Cessation  No Change    Warm-up and Cool-down  Performed as group-led instruction    Resistance Training Performed  Yes    VAD Patient?  No    PAD/SET Patient?  No      Pain Assessment   Currently in Pain?  No/denies    Pain Score  0-No pain    Multiple Pain Sites  No       Capillary Blood Glucose: No results found for this or any previous visit (from the past 24 hour(s)).    Social History   Tobacco Use  Smoking Status Former Smoker  . Packs/day: 0.50  . Years: 30.00  . Pack years: 15.00  . Types: Cigarettes  Smokeless Tobacco Former Systems developer  . Types: Snuff  Tobacco Comment   USING NICORETTE GUM TO TRY AND STOP USING SMOKELESS TOBACCO    Goals Met:  Independence with exercise equipment Exercise tolerated well No report of cardiac concerns or symptoms Strength training completed today  Goals Unmet:  Not Applicable  Comments: Pt able to follow exercise prescription today without complaint.  Will continue to monitor for progression. Check out 9:15.   Dr. Kate Sable is Medical Director for [facility] Cardiac and Pulmonary Rehab.

## 2018-01-06 ENCOUNTER — Encounter (HOSPITAL_COMMUNITY): Payer: PPO

## 2018-01-09 ENCOUNTER — Encounter (HOSPITAL_COMMUNITY)
Admission: RE | Admit: 2018-01-09 | Discharge: 2018-01-09 | Disposition: A | Discharge status: Home / Self Care | Payer: PPO | Source: Ambulatory Visit | Attending: Cardiology | Admitting: Cardiology

## 2018-01-09 DIAGNOSIS — Z951 Presence of aortocoronary bypass graft: Secondary | ICD-10-CM | POA: Diagnosis not present

## 2018-01-09 NOTE — Progress Notes (Signed)
Cardiac Individual Treatment Plan  Patient Details  Name: Chase Guerrero MRN: [id number] Date of Birth: 1946/01/22 Referring Provider:    Initial Encounter Date:   Visit Diagnosis: S/P CABG x 2  Patient's Home Medications on Admission:  Current Outpatient Medications:  .  acetaminophen (TYLENOL) 500 MG tablet, Take 2 tablets (1,000 mg total) by mouth every 6 (six) hours as needed for mild pain or fever., Disp: 30 tablet, Rfl: 0 .  aspirin EC 81 MG tablet, Take 81 mg by mouth daily., Disp: , Rfl:  .  atorvastatin (LIPITOR) 80 MG tablet, Take 1 tablet (80 mg total) by mouth daily., Disp: 90 tablet, Rfl: 3 .  cetirizine (ZYRTEC) 10 MG tablet, Take 10 mg by mouth daily as needed for allergies., Disp: , Rfl:  .  finasteride (PROSCAR) 5 MG tablet, Take 5 mg by mouth daily., Disp: , Rfl:  .  fluticasone (FLONASE) 50 MCG/ACT nasal spray, Place 1 spray into both nostrils daily as needed for allergies or rhinitis., Disp: , Rfl:  .  furosemide (LASIX) 20 MG tablet, Take 1 tablet (20 mg total) by mouth daily as needed. Take one tab by mouth daily x 3 days, then only as needed for swelling or edema (Patient taking differently: Take 20 mg by mouth every Monday, Wednesday, and Friday. Take one tab by mouth daily x 3 days, then only as needed for swelling or edema), Disp: 90 tablet, Rfl: 0 .  Menthol, Topical Analgesic, (BIOFREEZE EX), Apply 1 application topically daily as needed (pain)., Disp: , Rfl:  .  metoprolol tartrate (LOPRESSOR) 25 MG tablet, Take 12.5 mg by mouth 2 (two) times daily. , Disp: , Rfl:  .  nitroGLYCERIN (NITROSTAT) 0.4 MG SL tablet, Place 0.4 mg under the tongue every 5 (five) minutes as needed for chest pain. , Disp: , Rfl:  .  potassium chloride SA (K-DUR,KLOR-CON) 20 MEQ tablet, Take 1 tablet (20 mEq total) by mouth daily as needed (only on the day you take the as needed Lasix). (Patient taking differently: Take 20 mEq by mouth every Monday, Wednesday, and Friday. ), Disp: 90 tablet,  Rfl: 0 .  ranitidine (ZANTAC) 150 MG tablet, Take 150 mg by mouth daily as needed for heartburn., Disp: , Rfl:  .  Soft Lens Products (SENSITIVE EYES SALINE) SOLN, Place 1 drop into both eyes 3 (three) times daily., Disp: , Rfl:   Past Medical History: Past Medical History:  Diagnosis Date  . Arthritis   . Atrial fibrillation ([place])   . Cancer ([place])    skin  . Coronary artery disease   . Enlarged prostate   . GERD (gastroesophageal reflux disease)   . Hypertension   . Myocardial infarction ([facility])    mild  . Pleural effusion   . Sleep apnea    not wearing  cpap  not worn in 9-10 yrs    Tobacco Use: Social History   Tobacco Use  Smoking Status Former Smoker  . Packs/day: 0.50  . Years: 30.00  . Pack years: 15.00  . Types: Cigarettes  Smokeless Tobacco Former Systems developer  . Types: Snuff  Tobacco Comment   USING NICORETTE GUM TO TRY AND STOP USING SMOKELESS TOBACCO    Labs: Recent Review Flowsheet Data    Labs for ITP Cardiac and Pulmonary Rehab Latest Ref Rng & Units 06/27/2017 06/27/2017 06/28/2017 06/29/2017 09/20/2017   Cholestrol 100 - 199 mg/dL - - - - 121   LDLCALC 0 - 99 mg/dL - - - - 61  HDL >39 mg/dL - - - - 43   Trlycerides 0 - 149 mg/dL - - - - 85   Hemoglobin A1c 4.8 - 5.6 % - - - - -   PHART 7.350 - 7.450 - 7.350 - - -   PCO2ART 32.0 - 48.0 mmHg - 39.9 - - -   HCO3 20.0 - 28.0 mmol/L - 22.0 - - -   TCO2 22 - 32 mmol/L '24 23 24 24 '$ -   ACIDBASEDEF 0.0 - 2.0 mmol/L - 3.0(H) - - -   O2SAT % - 99.0 - - -      Capillary Blood Glucose: Lab Results  Component Value Date   GLUCAP 96 07/01/2017   GLUCAP 124 (H) 06/30/2017   GLUCAP 111 (H) 06/30/2017   GLUCAP 116 (H) 06/30/2017   GLUCAP 122 (H) 06/30/2017     Exercise Target Goals: Exercise Program Goal: Individual exercise prescription set using results from initial 6 min walk test and THRR while considering  patient's activity barriers and safety.   Exercise Prescription Goal: Starting with aerobic activity  30 plus minutes a day, 3 days per week for initial exercise prescription. Provide home exercise prescription and guidelines that participant acknowledges understanding prior to discharge.  Activity Barriers & Risk Stratification: Activity Barriers & Cardiac Risk Stratification - 10/17/17 0953      Activity Barriers & Cardiac Risk Stratification   Activity Barriers  Back Problems   shoulders worn out   Cardiac Risk Stratification  High       6 Minute Walk: 6 Minute Walk    Row Name 10/17/17 0855         6 Minute Walk   Phase  Initial     Distance  1300 feet     Walk Time  6 minutes     # of Rest Breaks  0     MPH  2.46     METS  2.89     RPE  9     Perceived Dyspnea   7     VO2 Peak  9.27     Resting HR  63 bpm     Resting BP  110/50     Resting Oxygen Saturation   94 %     Exercise Oxygen Saturation  during 6 min walk  87 %     Max Ex. HR  87 bpm     Max Ex. BP  128/58     2 Minute Post BP  116/58        Oxygen Initial Assessment:   Oxygen Re-Evaluation:   Oxygen Discharge (Final Oxygen Re-Evaluation):   Initial Exercise Prescription:   Perform Capillary Blood Glucose checks as needed.  Exercise Prescription Changes:  Exercise Prescription Changes    Row Name 11/08/17 1200 11/29/17 1100 12/29/17 1200         Response to Exercise   Blood Pressure (Admit)  102/50  108/60  112/60     Blood Pressure (Exercise)  124/60  124/64  128/60     Blood Pressure (Exit)  102/60  104/58  102/54     Heart Rate (Admit)  74 bpm  65 bpm  67 bpm     Heart Rate (Exercise)  87 bpm  92 bpm  89 bpm     Heart Rate (Exit)  83 bpm  78 bpm  75 bpm     Rating of Perceived Exertion (Exercise)  '12  12  12     '$ Comments  -  increase in overall MET level  -     Duration  Continue with 30 min of aerobic exercise without signs/symptoms of physical distress.  Continue with 30 min of aerobic exercise without signs/symptoms of physical distress.  Continue with 30 min of aerobic exercise  without signs/symptoms of physical distress.     Intensity  THRR unchanged  THRR unchanged  THRR unchanged       Progression   Progression  Continue to progress workloads to maintain intensity without signs/symptoms of physical distress.  Continue to progress workloads to maintain intensity without signs/symptoms of physical distress.  Continue to progress workloads to maintain intensity without signs/symptoms of physical distress.     Average METs  2.17  2.8  2.93       Resistance Training   Weight  '1  3  4     '$ Reps  10-15  10-15  10-15       Treadmill   MPH  1.5  2  2.3     Grade  0  0  0     Minutes  '17  17  17     '$ METs  2.14  2.5  2.76       NuStep   Level  '2  2  3     '$ SPM  95  104  96     Minutes  '22  22  22     '$ METs  2.2  3.1  3.1        Exercise Comments:  Exercise Comments    Row Name 10/24/17 1618 11/15/17 0800 12/14/17 1432 01/03/18 0756     Exercise Comments  Patient has just starter. Has done well his first few visits.   Patient continues to do well in the program and handle all progressions with ease.   Patient is always willing to work hard to increase workload and MET levels. He is feeling stronger and feels he is able to do more around the house and to help his family members.   Patient continues to push himself every session. He has worked up to 2.66mh on the TM with ease.        Exercise Goals and Review:  Exercise Goals    Row Name 10/17/17 0954             Exercise Goals   Increase Physical Activity  Yes       Intervention  Provide advice, education, support and counseling about physical activity/exercise needs.       Expected Outcomes  Short Term: Attend rehab on a regular basis to increase amount of physical activity.;Long Term: Add in home exercise to make exercise part of routine and to increase amount of physical activity.;Long Term: Exercising regularly at least 3-5 days a week.       Increase Strength and Stamina  Yes       Intervention   Provide advice, education, support and counseling about physical activity/exercise needs.       Expected Outcomes  Short Term: Increase workloads from initial exercise prescription for resistance, speed, and METs.;Long Term: Improve cardiorespiratory fitness, muscular endurance and strength as measured by increased METs and functional capacity (6MWT)       Able to understand and use rate of perceived exertion (RPE) scale  Yes       Intervention  Provide education and explanation on how to use RPE scale       Expected Outcomes  Short Term: Able to use RPE  daily in rehab to express subjective intensity level;Long Term:  Able to use RPE to guide intensity level when exercising independently       Knowledge and understanding of Target Heart Rate Range (THRR)  Yes       Intervention  Provide education and explanation of THRR including how the numbers were predicted and where they are located for reference       Expected Outcomes  Short Term: Able to use daily as guideline for intensity in rehab;Long Term: Able to use THRR to govern intensity when exercising independently       Able to check pulse independently  Yes       Intervention  Provide education and demonstration on how to check pulse in carotid and radial arteries.       Expected Outcomes  Short Term: Able to explain why pulse checking is important during independent exercise;Long Term: Able to check pulse independently and accurately       Understanding of Exercise Prescription  Yes       Intervention  Provide education, explanation, and written materials on patient's individual exercise prescription       Expected Outcomes  Short Term: Able to explain program exercise prescription;Long Term: Able to explain home exercise prescription to exercise independently          Exercise Goals Re-Evaluation : Exercise Goals Re-Evaluation    Row Name 10/24/17 1614 11/15/17 0757 12/14/17 1431 01/03/18 0752       Exercise Goal Re-Evaluation   Exercise  Goals Review  Increase Physical Activity;Increase Strength and Stamina;Understanding of Exercise Prescription;Knowledge and understanding of Target Heart Rate Range (THRR)  Increase Physical Activity;Increase Strength and Stamina;Understanding of Exercise Prescription;Knowledge and understanding of Target Heart Rate Range (THRR)  Increase Physical Activity;Increase Strength and Stamina;Understanding of Exercise Prescription;Knowledge and understanding of Target Heart Rate Range (THRR)  Increase Physical Activity;Increase Strength and Stamina;Understanding of Exercise Prescription;Knowledge and understanding of Target Heart Rate Range (THRR)    Comments  Patient just started the program. He completed 2 visit. Will continue to monitor his progress  Patient is still pretty new to the program, having completed 8 sessions due to having to be out some. He has tolerated all of his progressions well and getting stronger everyday. Has already been able to get back in to fishing and is very excited about that.   Patient has continued to work hard and progress throught the program. He has attended regularly to help build stamina. He is up to 2.3 MPH on the treadmill with ease. We will continue to monitor his progress.   Patient continues to work hard in cardiac rehab every session. He has stated that he feels stronger and able to do more around the house. Once the weather is better he plans to get back out golfing. We will continue to monitor and progress him through the program.    Expected Outcomes  To increase in functional capicity and ADL's. Wants to get back to doing the things he wants to do.   Increase in functional capacity and ablilty to get back in to doing the things that he wants to do such as golf and fishing.   Increase in functional capacity and ablilty to get back in to doing the things that he wants to do such as golf and fishing.   Increase in functional capacity and ablilty to get back in to doing the  things that he wants to do such as golf and fishing.  Discharge Exercise Prescription (Final Exercise Prescription Changes): Exercise Prescription Changes - 12/29/17 1200      Response to Exercise   Blood Pressure (Admit)  112/60    Blood Pressure (Exercise)  128/60    Blood Pressure (Exit)  102/54    Heart Rate (Admit)  67 bpm    Heart Rate (Exercise)  89 bpm    Heart Rate (Exit)  75 bpm    Rating of Perceived Exertion (Exercise)  12    Duration  Continue with 30 min of aerobic exercise without signs/symptoms of physical distress.    Intensity  THRR unchanged      Progression   Progression  Continue to progress workloads to maintain intensity without signs/symptoms of physical distress.    Average METs  2.93      Resistance Training   Weight  4    Reps  10-15      Treadmill   MPH  2.3    Grade  0    Minutes  17    METs  2.76      NuStep   Level  3    SPM  96    Minutes  22    METs  3.1       Nutrition:  Target Goals: Understanding of nutrition guidelines, daily intake of sodium '1500mg'$ , cholesterol '200mg'$ , calories 30% from fat and 7% or less from saturated fats, daily to have 5 or more servings of fruits and vegetables.  Biometrics: Pre Biometrics - 10/17/17 0857      Pre Biometrics   Height  '5\' 10"'$  (1.778 m)    Waist Circumference  40.5 inches    Hip Circumference  36 inches    Waist to Hip Ratio  1.12 %    Triceps Skinfold  4 mm    % Body Fat  22.4 %    Grip Strength  34.9 kg    Flexibility  0 in   bulging discs in back    Single Leg Stand  8 seconds        Nutrition Therapy Plan and Nutrition Goals: Nutrition Therapy & Goals - 12/23/17 1425      Personal Nutrition Goals   Nutrition Goal  For heart healthy choices add >50% of whole grains, make half their plate fruits and vegetables. Discuss the difference between starchy vegetables and leafy greens, and how leafy vegetables provide fiber, helps maintain healthy weight, helps control blood  glucose, and lowers cholesterol.  Discuss purchasing fresh or frozen vegetable to reduce sodium and not to add grease, fat or sugar. Consume <18oz of red meat per week. Consume lean cuts of meats and very little of meats high in sodium and nitrates such as pork and lunch meats. Discussed portion control for all food groups.      Comments  Patient attended RD class 12/22/17.       Nutrition Assessments: Nutrition Assessments - 10/17/17 1008      MEDFICTS Scores   Pre Score  21       Nutrition Goals Re-Evaluation: Nutrition Goals Re-Evaluation    [place] Name 01/09/18 1241             Goals   Current Weight  199 lb 6.4 oz (90.4 kg)       Nutrition Goal  For heart healthy choices add >50% of whole grains, make half their plate fruits and vegetables. Discuss the difference between starchy vegetables and leafy greens, and how leafy vegetables provide fiber, helps maintain  healthy weight, helps control blood glucose, and lowers cholesterol.  Discuss purchasing fresh or frozen vegetable to reduce sodium and not to add grease, fat or sugar. Consume <18oz of red meat per week. Consume lean cuts of meats and very little of meats high in sodium and nitrates such as pork and lunch meats. Discussed portion control for all food groups.         Comment  Patient has gained 3 lbs since last 30 day review. He says he is eating heart healthy. Will continue to monitor.        Expected Outcome  Patient will continue to work toward meeting his nutritional goals.           Nutrition Goals Discharge (Final Nutrition Goals Re-Evaluation): Nutrition Goals Re-Evaluation - 01/09/18 1241      Goals   Current Weight  199 lb 6.4 oz (90.4 kg)    Nutrition Goal  For heart healthy choices add >50% of whole grains, make half their plate fruits and vegetables. Discuss the difference between starchy vegetables and leafy greens, and how leafy vegetables provide fiber, helps maintain healthy weight, helps control blood  glucose, and lowers cholesterol.  Discuss purchasing fresh or frozen vegetable to reduce sodium and not to add grease, fat or sugar. Consume <18oz of red meat per week. Consume lean cuts of meats and very little of meats high in sodium and nitrates such as pork and lunch meats. Discussed portion control for all food groups.      Comment  Patient has gained 3 lbs since last 30 day review. He says he is eating heart healthy. Will continue to monitor.     Expected Outcome  Patient will continue to work toward meeting his nutritional goals.        Psychosocial: Target Goals: Acknowledge presence or absence of significant depression and/or stress, maximize coping skills, provide positive support system. Participant is able to verbalize types and ability to use techniques and skills needed for reducing stress and depression.  Initial Review & Psychosocial Screening: Initial Psych Review & Screening - 10/17/17 1007      Initial Review   Current issues with  None Identified      Family Dynamics   Good Support System?  Yes      Barriers   Psychosocial barriers to participate in program  There are no identifiable barriers or psychosocial needs.      Screening Interventions   Interventions  Encouraged to exercise    Expected Outcomes  Short Term goal: Identification and review with participant of any Quality of Life or Depression concerns found by scoring the questionnaire.;Long Term goal: The participant improves quality of Life and PHQ9 Scores as seen by post scores and/or verbalization of changes       Quality of Life Scores: Quality of Life - 10/17/17 0858      Quality of Life   Select  Quality of Life      Quality of Life Scores   Health/Function Pre  15.43 %    Socioeconomic Pre  23.17 %    Psych/Spiritual Pre  24 %    Family Pre  19.5 %    GLOBAL Pre  19.27 %      Scores of 19 and below usually indicate a poorer quality of life in these areas.  A difference of  2-3 points is a  clinically meaningful difference.  A difference of 2-3 points in the total score of the Quality of Life Index  has been associated with significant improvement in overall quality of life, self-image, physical symptoms, and general health in studies assessing change in quality of life.  PHQ-9: Recent Review Flowsheet Data    Depression screen [facility] 2/9 10/17/2017   Decreased Interest 0   Down, Depressed, Hopeless 0   PHQ - 2 Score 0   Altered sleeping 1   Tired, decreased energy 1   Change in appetite 0   Feeling bad or failure about yourself  0   Trouble concentrating 1   Moving slowly or fidgety/restless 0   Suicidal thoughts 0   PHQ-9 Score 3   Difficult doing work/chores Somewhat difficult     Interpretation of Total Score  Total Score Depression Severity:  1-4 = Minimal depression, 5-9 = Mild depression, 10-14 = Moderate depression, 15-19 = Moderately severe depression, 20-27 = Severe depression   Psychosocial Evaluation and Intervention: Psychosocial Evaluation - 10/17/17 1007      Psychosocial Evaluation & Interventions   Interventions  Encouraged to exercise with the program and follow exercise prescription    Continue Psychosocial Services   No Follow up required       Psychosocial Re-Evaluation: Psychosocial Re-Evaluation    [place] Name 11/17/17 [place] 12/15/17 0745 01/09/18 1244         Psychosocial Re-Evaluation   Current issues with  None Identified  None Identified  None Identified     Comments  Patient's initial QOL score was 19.27 and his PHQ-9 score was 3 with no issues identified.  Patient's initial QOL score was 19.27 and his PHQ-9 score was 3 with no issues identified.  Patient's initial QOL score was 19.27 and his PHQ-9 score was 3 with no issues identified.     Expected Outcomes  Patient will not have any psychosocial issues identified at discharge.   Patient will not have any psychosocial issues identified at discharge.   Patient will not have any psychosocial  issues identified at discharge.      Interventions  Stress management education;Encouraged to attend Cardiac Rehabilitation for the exercise;Relaxation education  Stress management education;Encouraged to attend Cardiac Rehabilitation for the exercise;Relaxation education  Stress management education;Encouraged to attend Cardiac Rehabilitation for the exercise;Relaxation education     Continue Psychosocial Services   No Follow up required  No Follow up required  -        Psychosocial Discharge (Final Psychosocial Re-Evaluation): Psychosocial Re-Evaluation - 01/09/18 1244      Psychosocial Re-Evaluation   Current issues with  None Identified    Comments  Patient's initial QOL score was 19.27 and his PHQ-9 score was 3 with no issues identified.    Expected Outcomes  Patient will not have any psychosocial issues identified at discharge.     Interventions  Stress management education;Encouraged to attend Cardiac Rehabilitation for the exercise;Relaxation education       Vocational Rehabilitation: Provide vocational rehab assistance to qualifying candidates.   Vocational Rehab Evaluation & Intervention: Vocational Rehab - 10/17/17 1010      Initial Vocational Rehab Evaluation & Intervention   Assessment shows need for Vocational Rehabilitation  No       Education: Education Goals: Education classes will be provided on a weekly basis, covering required topics. Participant will state understanding/return demonstration of topics presented.  Learning Barriers/Preferences: Learning Barriers/Preferences - 10/17/17 1008      Learning Barriers/Preferences   Learning Barriers  None    Learning Preferences  Pictoral;Video;Written Material       Education Topics: Hypertension,  Hypertension Reduction -Define heart disease and high blood pressure. Discus how high blood pressure affects the body and ways to reduce high blood pressure.   CARDIAC REHAB PHASE II EXERCISE from 01/04/2018 in [place]  Date  12/14/17  Educator  D. Coad  Instruction Review Code  2- Demonstrated Understanding      Exercise and Your Heart -Discuss why it is important to exercise, the FITT principles of exercise, normal and abnormal responses to exercise, and how to exercise safely.   CARDIAC REHAB PHASE II EXERCISE from 01/04/2018 in [place]  Date  12/21/17  Educator  Coad  Instruction Review Code  2- Demonstrated Understanding      Angina -Discuss definition of angina, causes of angina, treatment of angina, and how to decrease risk of having angina.   CARDIAC REHAB PHASE II EXERCISE from 01/04/2018 in [place]  Date  12/28/17  Educator  Coad  Instruction Review Code  2- Demonstrated Understanding      Cardiac Medications -Review what the following cardiac medications are used for, how they affect the body, and side effects that may occur when taking the medications.  Medications include Aspirin, Beta blockers, calcium channel blockers, ACE Inhibitors, angiotensin receptor blockers, diuretics, digoxin, and antihyperlipidemics.   CARDIAC REHAB PHASE II EXERCISE from 01/04/2018 in [place]  Date  01/04/18  Educator  Etheleen Mayhew  Instruction Review Code  2- Demonstrated Understanding      Congestive Heart Failure -Discuss the definition of CHF, how to live with CHF, the signs and symptoms of CHF, and how keep track of weight and sodium intake.   Heart Disease and Intimacy -Discus the effect sexual activity has on the heart, how changes occur during intimacy as we age, and safety during sexual activity.   Smoking Cessation / COPD -Discuss different methods to quit smoking, the health benefits of quitting smoking, and the definition of COPD.   CARDIAC REHAB PHASE II EXERCISE from 01/04/2018 in [place]  Date  10/26/17  Educator  Etheleen Mayhew  Instruction Review Code   2- Demonstrated Understanding      Nutrition I: Fats -Discuss the types of cholesterol, what cholesterol does to the heart, and how cholesterol levels can be controlled.   Nutrition II: Labels -Discuss the different components of food labels and how to read food label   CARDIAC REHAB PHASE II EXERCISE from 01/04/2018 in [place]  Date  11/09/17  Educator  Etheleen Mayhew  Instruction Review Code  2- Demonstrated Understanding      Heart Parts/Heart Disease and PAD -Discuss the anatomy of the heart, the pathway of blood circulation through the heart, and these are affected by heart disease.   CARDIAC REHAB PHASE II EXERCISE from 01/04/2018 in [place]  Date  11/16/17  Educator  Etheleen Mayhew   Instruction Review Code  2- Demonstrated Understanding      Stress I: Signs and Symptoms -Discuss the causes of stress, how stress may lead to anxiety and depression, and ways to limit stress.   CARDIAC REHAB PHASE II EXERCISE from 01/04/2018 in [place]  Date  11/23/17  Educator  D.Coad  Instruction Review Code  2- Demonstrated Understanding      Stress II: Relaxation -Discuss different types of relaxation techniques to limit stress.   CARDIAC REHAB PHASE II EXERCISE from 01/04/2018 in [place]  Date  11/30/17  Educator  D. Coad  Instruction Review Code  2- Demonstrated Understanding      Warning Signs of Stroke / TIA -Discuss definition of a stroke, what the signs and symptoms are of a stroke, and how to identify when someone is having stroke.   CARDIAC REHAB PHASE II EXERCISE from 01/04/2018 in [place]  Date  12/07/17  Educator  Etheleen Mayhew  Instruction Review Code  2- Demonstrated Understanding      Knowledge Questionnaire Score: Knowledge Questionnaire Score - 10/17/17 1009      Knowledge Questionnaire Score   Pre Score  24/28       Core  Components/Risk Factors/Patient Goals at Admission: Personal Goals and Risk Factors at Admission - 10/17/17 1010      Core Components/Risk Factors/Patient Goals on Admission    Weight Management  Weight Maintenance    Personal Goal Other  Yes    Personal Goal  Be able to function during ADL's and be able to do things I want to do.     Intervention  Attend CR 3 x week and supplement at home exercise 2 x week.     Expected Outcomes  Reach personal goals       Core Components/Risk Factors/Patient Goals Review:  Goals and Risk Factor Review    Row Name 11/17/17 0749 12/15/17 0743 01/09/18 1242         Core Components/Risk Factors/Patient Goals Review   Personal Goals Review  Weight Management/Obesity Being able to function; do ADL's do things I want to do; live a good life.   Weight Management/Obesity Be able to function; do ADL's ; do things I want to do; live a good life.   Weight Management/Obesity Being able to function; do ADL's; do things I want to do; live a good life.      Review  Patient has completed 9 sessions gaining 5 lbs since he started the program. His attendance has been inconsitent but overall, he is doing well in the program with progression. He is still knew to the program but says he feels more like doing more now. He was able to go fishing last week without getting tired. Will continue to monitor for progress.   Patient has completed 20 sessions losing 1 lb since his last 30 day review. He is doing well in the program with progression and his attendance is consistent. He says he is feeling stronger and is able to do his ADL's and the things he wants. 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 He is pleased with his progress in the program. Will continue to monitor for progress.   Patient has completed 30 sessions gaining 3 lbs since last 30 day review. He continues to do well in the program with progression. He says he continues to feel stronger and is able to do all  the things he wants to do. He continues to be pleased with his progress in the program. Will continue to monitor for progress.      Expected Outcomes  Patient will continue to attend sesisons more consistently and complete the program meeting his personal goals.   Patient will continue to attend sesisons more consistently and complete the program meeting his personal goals.   Patient will continue to attend sesisons more consistently and complete the program meeting his personal goals.         Core Components/Risk Factors/Patient Goals at Discharge (Final Review):  Goals and Risk Factor Review -  01/09/18 1242      Core Components/Risk Factors/Patient Goals Review   Personal Goals Review  Weight Management/Obesity   Being able to function; do ADL's; do things I want to do; live a good life.    Review  Patient has completed 30 sessions gaining 3 lbs since last 30 day review. He continues to do well in the program with progression. He says he continues to feel stronger and is able to do all the things he wants to do. He continues to be pleased with his progress in the program. Will continue to monitor for progress.     Expected Outcomes  Patient will continue to attend sesisons more consistently and complete the program meeting his personal goals.        ITP Comments: ITP Comments    Row Name 10/26/17 0746           ITP Comments  Patient is new to program. He has completed 2 sessions. Will continue to monitor for progress.           Comments: ITP REVIEW Patient doing well in the program. Will continue to monitor for progress.

## 2018-01-09 NOTE — Progress Notes (Signed)
Daily Session Note  Patient Details  Name: Chase Guerrero MRN: [id number] Date of Birth: 11/23/1945 Referring Provider:    Encounter Date: 01/09/2018  Check In: Session Check In - 01/09/18 0815      Check-In   Supervising physician immediately available to respond to emergencies  See telemetry face sheet for immediately available MD    Location  [facility]    Staff Present  Aundra Dubin, RN, Cory Munch, Exercise Physiologist    Medication changes reported      No    Fall or balance concerns reported     No    Tobacco Cessation  No Change    Warm-up and Cool-down  Performed as group-led instruction    Resistance Training Performed  Yes    VAD Patient?  No    PAD/SET Patient?  No      Pain Assessment   Currently in Pain?  No/denies    Pain Score  0-No pain    Multiple Pain Sites  No       Capillary Blood Glucose: No results found for this or any previous visit (from the past 24 hour(s)).    Social History   Tobacco Use  Smoking Status Former Smoker  . Packs/day: 0.50  . Years: 30.00  . Pack years: 15.00  . Types: Cigarettes  Smokeless Tobacco Former Systems developer  . Types: Snuff  Tobacco Comment   USING NICORETTE GUM TO TRY AND STOP USING SMOKELESS TOBACCO    Goals Met:  Independence with exercise equipment Exercise tolerated well No report of cardiac concerns or symptoms Strength training completed today  Goals Unmet:  Not Applicable  Comments: Pt able to follow exercise prescription today without complaint.  Will continue to monitor for progression. Check out 9:15.   Dr. Kate Sable is Medical Director for [facility] Card[facility] and Pulmonary Rehab.

## 2018-01-11 ENCOUNTER — Encounter (HOSPITAL_COMMUNITY)
Admission: RE | Admit: 2018-01-11 | Discharge: 2018-01-11 | Disposition: A | Discharge status: Home / Self Care | Payer: PPO | Source: Ambulatory Visit | Attending: Cardiology | Admitting: Cardiology

## 2018-01-11 DIAGNOSIS — Z951 Presence of aortocoronary bypass graft: Secondary | ICD-10-CM | POA: Diagnosis not present

## 2018-01-11 NOTE — Progress Notes (Signed)
Daily Session Note  Patient Details  Name: CAMBELL STANEK MRN: [id number] Date of Birth: 1945-07-18 Referring Provider:    Encounter Date: 01/11/2018  Check In: Session Check In - 01/11/18 0815      Check-In   Supervising physician immediately available to respond to emergencies  See telemetry face sheet for immediately available MD    Location  [facility]    Staff Present  Aundra Dubin, RN, Cory Munch, Exercise Physiologist    Medication changes reported      No    Fall or balance concerns reported     No    Warm-up and Cool-down  Performed as group-led instruction    Resistance Training Performed  Yes    VAD Patient?  No    PAD/SET Patient?  No      Pain Assessment   Currently in Pain?  No/denies    Pain Score  0-No pain    Multiple Pain Sites  No       Capillary Blood Glucose: No results found for this or any previous visit (from the past 24 hour(s)).    Social History   Tobacco Use  Smoking Status Former Smoker  . Packs/day: 0.50  . Years: 30.00  . Pack years: 15.00  . Types: Cigarettes  Smokeless Tobacco Former Systems developer  . Types: Snuff  Tobacco Comment   USING NICORETTE GUM TO TRY AND STOP USING SMOKELESS TOBACCO    Goals Met:  Independence with exercise equipment Exercise tolerated well No report of cardiac concerns or symptoms Strength training completed today  Goals Unmet:  Not Applicable  Comments: Pt able to follow exercise prescription today without complaint.  Will continue to monitor for progression. Check out 915.   Dr. Kate Sable is Medical Director for [facility] Cardiac and Pulmonary Rehab.

## 2018-01-13 ENCOUNTER — Encounter (HOSPITAL_COMMUNITY)
Admission: RE | Admit: 2018-01-13 | Discharge: 2018-01-13 | Disposition: A | Discharge status: Home / Self Care | Payer: PPO | Source: Ambulatory Visit | Attending: Cardiology | Admitting: Cardiology

## 2018-01-13 DIAGNOSIS — Z951 Presence of aortocoronary bypass graft: Secondary | ICD-10-CM | POA: Diagnosis not present

## 2018-01-13 NOTE — Progress Notes (Signed)
Daily Session Note  Patient Details  Name: Chase Guerrero MRN: [id number] Date of Birth: September 06, 1945 Referring Provider:    Encounter Date: 01/13/2018  Check In: Session Check In - 01/13/18 0815      Check-In   Supervising physician immediately available to respond to emergencies  See telemetry face sheet for immediately available MD    Location  [facility]    Staff Present  Aundra Dubin, RN, Cory Munch, Exercise Physiologist    Medication changes reported      No    Fall or balance concerns reported     No    Tobacco Cessation  No Change    Warm-up and Cool-down  Performed as group-led instruction    Resistance Training Performed  Yes    VAD Patient?  No    PAD/SET Patient?  No      Pain Assessment   Currently in Pain?  No/denies    Pain Score  0-No pain    Multiple Pain Sites  No       Capillary Blood Glucose: No results found for this or any previous visit (from the past 24 hour(s)).    Social History   Tobacco Use  Smoking Status Former Smoker  . Packs/day: 0.50  . Years: 30.00  . Pack years: 15.00  . Types: Cigarettes  Smokeless Tobacco Former Systems developer  . Types: Snuff  Tobacco Comment   USING NICORETTE GUM TO TRY AND STOP USING SMOKELESS TOBACCO    Goals Met:  Independence with exercise equipment Exercise tolerated well No report of cardiac concerns or symptoms Strength training completed today  Goals Unmet:  Not Applicable  Comments: Pt able to follow exercise prescription today without complaint.  Will continue to monitor for progression. Check out 9:15.   Dr. Kate Sable is Medical Director for [facility] Cardiac and Pulmonary Rehab.

## 2018-01-16 ENCOUNTER — Encounter (HOSPITAL_COMMUNITY): Admission: RE | Admit: 2018-01-16 | Payer: PPO | Source: Ambulatory Visit

## 2018-01-18 ENCOUNTER — Encounter (HOSPITAL_COMMUNITY)
Admission: RE | Admit: 2018-01-18 | Discharge: 2018-01-18 | Disposition: A | Discharge status: Home / Self Care | Payer: PPO | Source: Ambulatory Visit | Attending: Cardiology | Admitting: Cardiology

## 2018-01-18 DIAGNOSIS — Z951 Presence of aortocoronary bypass graft: Secondary | ICD-10-CM

## 2018-01-18 NOTE — Progress Notes (Signed)
Daily Session Note  Patient Details  Name: Chase Guerrero MRN: [id number] Date of Birth: April 05, 1945 Referring Provider:    Encounter Date: 01/18/2018  Check In: Session Check In - 01/18/18 0815      Check-In   Supervising physician immediately available to respond to emergencies  See telemetry face sheet for immediately available MD    Location  [facility]    Staff Present  Aundra Dubin, RN, Cory Munch, Exercise Physiologist;Other    Medication changes reported      No    Fall or balance concerns reported     No    Warm-up and Cool-down  Performed as group-led instruction    Resistance Training Performed  Yes    VAD Patient?  No    PAD/SET Patient?  No      Pain Assessment   Currently in Pain?  No/denies    Pain Score  0-No pain    Multiple Pain Sites  No       Capillary Blood Glucose: No results found for this or any previous visit (from the past 24 hour(s)).    Social History   Tobacco Use  Smoking Status Former Smoker  . Packs/day: 0.50  . Years: 30.00  . Pack years: 15.00  . Types: Cigarettes  Smokeless Tobacco Former Systems developer  . Types: Snuff  Tobacco Comment   USING NICORETTE GUM TO TRY AND STOP USING SMOKELESS TOBACCO    Goals Met:  Independence with exercise equipment Exercise tolerated well No report of cardiac concerns or symptoms Strength training completed today  Goals Unmet:  Not Applicable  Comments: Pt able to follow exercise prescription today without complaint.  Will continue to monitor for progression. Check out 915.   Dr. Kate Sable is Medical Director for [facility] Cardiac and Pulmonary Rehab.

## 2018-01-20 ENCOUNTER — Encounter (HOSPITAL_COMMUNITY)
Admission: RE | Admit: 2018-01-20 | Discharge: 2018-01-20 | Disposition: A | Discharge status: Home / Self Care | Payer: PPO | Source: Ambulatory Visit | Attending: Cardiology | Admitting: Cardiology

## 2018-01-20 DIAGNOSIS — Z951 Presence of aortocoronary bypass graft: Secondary | ICD-10-CM

## 2018-01-20 NOTE — Progress Notes (Signed)
Daily Session Note  Patient Details  Name: NAVARRE DIANA MRN: [id number] Date of Birth: [birth year] Referring Provider:    Encounter Date: 01/20/2018  Check In: Session Check In - 01/20/18 0815      Check-In   Supervising physician immediately available to respond to emergencies  See telemetry face sheet for immediately available MD    Location  [facility]    Staff Present  Aundra Dubin, RN, Cory Munch, Exercise Physiologist;Diane Coad, MS, EP, [facility], Exercise Physiologist    Medication changes reported      No    Fall or balance concerns reported     No    Tobacco Cessation  No Change    Warm-up and Cool-down  Performed as group-led instruction    Resistance Training Performed  Yes    VAD Patient?  No    PAD/SET Patient?  No      Pain Assessment   Currently in Pain?  No/denies    Pain Score  0-No pain    Multiple Pain Sites  No       Capillary Blood Glucose: No results found for this or any previous visit (from the past 24 hour(s)).    Social History   Tobacco Use  Smoking Status Former Smoker  . Packs/day: 0.50  . Years: 30.00  . Pack years: 15.00  . Types: Cigarettes  Smokeless Tobacco Former Systems developer  . Types: Snuff  Tobacco Comment   USING NICORETTE GUM TO TRY AND STOP USING SMOKELESS TOBACCO    Goals Met:  Independence with exercise equipment Exercise tolerated well No report of cardiac concerns or symptoms Strength training completed today  Goals Unmet:  Not Applicable  Comments: Pt able to follow exercise prescription today without complaint.  Will continue to monitor for progression. Check out 0915.   Dr. Kate Sable is Medical Director for [facility] Cardiac and Pulmonary Rehab.

## 2018-01-23 ENCOUNTER — Encounter (HOSPITAL_COMMUNITY)
Admission: RE | Admit: 2018-01-23 | Discharge: 2018-01-23 | Disposition: A | Discharge status: Home / Self Care | Payer: PPO | Source: Ambulatory Visit | Attending: Cardiology | Admitting: Cardiology

## 2018-01-23 DIAGNOSIS — Z951 Presence of aortocoronary bypass graft: Secondary | ICD-10-CM

## 2018-01-23 NOTE — Progress Notes (Signed)
Daily Session Note  Patient Details  Name: GOEBEL HELLUMS MRN: [id number] Date of Birth: 12/13/1945 Referring Provider:    Encounter Date: 01/23/2018  Check In: Session Check In - 01/23/18 0811      Check-In   Supervising physician immediately available to respond to emergencies  See telemetry face sheet for immediately available MD    Location  [facility]    Staff Present  Aundra Dubin, RN, Cory Munch, Exercise Physiologist;Diane Coad, MS, EP, [facility], Exercise Physiologist    Medication changes reported      No    Fall or balance concerns reported     No    Warm-up and Cool-down  Performed as group-led instruction    Resistance Training Performed  Yes    VAD Patient?  No    PAD/SET Patient?  No      Pain Assessment   Currently in Pain?  No/denies    Pain Score  0-No pain    Multiple Pain Sites  No       Capillary Blood Glucose: No results found for this or any previous visit (from the past 24 hour(s)).    Social History   Tobacco Use  Smoking Status Former Smoker  . Packs/day: 0.50  . Years: 30.00  . Pack years: 15.00  . Types: Cigarettes  Smokeless Tobacco Former Systems developer  . Types: Snuff  Tobacco Comment   USING NICORETTE GUM TO TRY AND STOP USING SMOKELESS TOBACCO    Goals Met:  Independence with exercise equipment Exercise tolerated well No report of cardiac concerns or symptoms Strength training completed today  Goals Unmet:  Not Applicable  Comments: Pt able to follow exercise prescription today without complaint.  Will continue to monitor for progression. Check out 915.   Dr. Kate Sable is Medical Director for [facility] Cardiac and Pulmonary Rehab.

## 2018-01-24 DIAGNOSIS — M545 Low back pain: Secondary | ICD-10-CM | POA: Diagnosis not present

## 2018-01-24 DIAGNOSIS — M9902 Segmental and somatic dysfunction of thoracic region: Secondary | ICD-10-CM | POA: Diagnosis not present

## 2018-01-24 DIAGNOSIS — M9901 Segmental and somatic dysfunction of cervical region: Secondary | ICD-10-CM | POA: Diagnosis not present

## 2018-01-24 DIAGNOSIS — M546 Pain in thoracic spine: Secondary | ICD-10-CM | POA: Diagnosis not present

## 2018-01-24 DIAGNOSIS — S134XXA Sprain of ligaments of cervical spine, initial encounter: Secondary | ICD-10-CM | POA: Diagnosis not present

## 2018-01-24 DIAGNOSIS — M47816 Spondylosis without myelopathy or radiculopathy, lumbar region: Secondary | ICD-10-CM | POA: Diagnosis not present

## 2018-01-24 DIAGNOSIS — M9903 Segmental and somatic dysfunction of lumbar region: Secondary | ICD-10-CM | POA: Diagnosis not present

## 2018-01-25 ENCOUNTER — Encounter (HOSPITAL_COMMUNITY)
Admission: RE | Admit: 2018-01-25 | Discharge: 2018-01-25 | Disposition: A | Discharge status: Home / Self Care | Payer: PPO | Source: Ambulatory Visit | Attending: Cardiology | Admitting: Cardiology

## 2018-01-25 VITALS — Ht 70.0 in | Wt 189.6 lb

## 2018-01-25 DIAGNOSIS — Z951 Presence of aortocoronary bypass graft: Secondary | ICD-10-CM | POA: Diagnosis not present

## 2018-01-25 NOTE — Progress Notes (Signed)
Daily Session Note  Patient Details  Name: Chase Guerrero MRN: [id number] Date of Birth: Jul 18, 1945 Referring Provider:    Encounter Date: 01/25/2018  Check In: Session Check In - 01/25/18 0815      Check-In   Supervising physician immediately available to respond to emergencies  See telemetry face sheet for immediately available MD    Location  [facility]    Staff Present  Aundra Dubin, RN, Cory Munch, Exercise Physiologist;Diane Coad, MS, EP, [facility], Exercise Physiologist    Medication changes reported      No    Fall or balance concerns reported     No    Tobacco Cessation  No Change    Warm-up and Cool-down  Performed as group-led instruction    Resistance Training Performed  Yes    VAD Patient?  No    PAD/SET Patient?  No      Pain Assessment   Currently in Pain?  No/denies    Pain Score  0-No pain    Multiple Pain Sites  No       Capillary Blood Glucose: No results found for this or any previous visit (from the past 24 hour(s)).    Social History   Tobacco Use  Smoking Status Former Smoker  . Packs/day: 0.50  . Years: 30.00  . Pack years: 15.00  . Types: Cigarettes  Smokeless Tobacco Former Systems developer  . Types: Snuff  Tobacco Comment   USING NICORETTE GUM TO TRY AND STOP USING SMOKELESS TOBACCO    Goals Met:  Independence with exercise equipment Exercise tolerated well No report of cardiac concerns or symptoms Strength training completed today  Goals Unmet:  Not Applicable  Comments: Pt able to follow exercise prescription today without complaint.  Will continue to monitor for progression. Check out 0915.   Dr. Kate Sable is Medical Director for [facility] Cardiac and Pulmonary Rehab.

## 2018-01-26 NOTE — Progress Notes (Signed)
Discharge Progress Report  Patient Details  Name: Chase Guerrero MRN: [id number] Date of Birth: 1945/11/30 Referring Provider:     Number of Visits: 36  Reason for Discharge:  Patient reached a stable level of exercise. Patient independent in their exercise. Patient has met program and personal goals.  Smoking History:  Social History   Tobacco Use  Smoking Status Former Smoker  . Packs/day: 0.50  . Years: 30.00  . Pack years: 15.00  . Types: Cigarettes  Smokeless Tobacco Former Systems developer  . Types: Snuff  Tobacco Comment   USING [employer] TO TRY AND STOP USING SMOKELESS TOBACCO    Diagnosis:  S/P CABG x 2  ADL [facility]:   Initial Exercise Prescription:   Discharge Exercise Prescription (Final Exercise Prescription Changes): Exercise Prescription Changes - 01/24/18 1500      Response to Exercise   Blood Pressure (Admit)  104/50    Blood Pressure (Exercise)  144/72    Blood Pressure (Exit)  96/54    Heart Rate (Admit)  76 bpm    Heart Rate (Exercise)  105 bpm    Heart Rate (Exit)  84 bpm    Rating of Perceived Exertion (Exercise)  11    Duration  Continue with 30 min of aerobic exercise without signs/symptoms of physical distress.    Intensity  THRR unchanged      Progression   Progression  Continue to progress workloads to maintain intensity without signs/symptoms of physical distress.    Average METs  2.9      Resistance Training   Weight  4    Reps  10-15      Treadmill   MPH  2.7    Grade  0    Minutes  17    METs  3      NuStep   Level  3    SPM  98    Minutes  22    METs  2.9       Functional Capacity: 6 Minute Walk    Row Name 10/17/17 0855 01/25/18 1318       6 Minute Walk   Phase  Initial  Discharge    Distance  1300 feet  1400 feet    Distance % Change  -  7.6 %    Distance Feet Change  -  100 ft    Walk Time  6 minutes  6 minutes    # of Rest Breaks  0  0    MPH  2.46  2.65    METS  2.89  3.03    RPE  9  9    Perceived  Dyspnea   7  6    VO2 Peak  9.27  9.75    Symptoms  -  No    Resting HR  63 bpm  79 bpm    Resting BP  110/50  100/60    Resting Oxygen Saturation   94 %  97 %    Exercise Oxygen Saturation  during 6 min walk  87 %  99 %    Max Ex. HR  87 bpm  85 bpm    Max Ex. BP  128/58  124/60    2 Minute Post BP  116/58  106/58       Psychological, QOL, Others - Outcomes: PHQ 2/9: Depression screen [facility] 2/9 01/26/2018 10/17/2017  Decreased Interest 0 0  Down, Depressed, Hopeless 0 0  PHQ - 2 Score 0 0  Altered  sleeping 1 1  Tired, decreased energy 0 1  Change in appetite 0 0  Feeling bad or failure about yourself  0 0  Trouble concentrating 0 1  Moving slowly or fidgety/restless 0 0  Suicidal thoughts 0 0  PHQ-9 Score 1 3  Difficult doing work/chores Not difficult at all Somewhat difficult    Quality of Life: Quality of Life - 01/25/18 1316      Quality of Life Scores   Health/Function Pre  15.43 %    Health/Function Post  23.6 %    Health/Function % Change  52.95 %    Socioeconomic Pre  23.17 %    Socioeconomic Post  25.71 %    Socioeconomic % Change   10.96 %    Psych/Spiritual Pre  24 %    Psych/Spiritual Post  25.07 %    Psych/Spiritual % Change  4.46 %    Family Pre  19.5 %    Family Post  26.4 %    Family % Change  35.38 %    GLOBAL Pre  19.27 %    GLOBAL Post  24.75 %    GLOBAL % Change  28.44 %       Personal Goals: Goals established at orientation with interventions provided to work toward goal. Personal Goals and Risk Factors at Admission - 10/17/17 1010      Core Components/Risk Factors/Patient Goals on Admission    Weight Management  Weight Maintenance    Personal Goal Other  Yes    Personal Goal  Be able to function during ADL's and be able to do things I want to do.     Intervention  Attend CR 3 x week and supplement at home exercise 2 x week.     Expected Outcomes  Reach personal goals        Personal Goals Discharge: Goals and Risk Factor Review     Row Name 11/17/17 0749 12/15/17 0743 01/09/18 1242 01/26/18 1456       Core Components/Risk Factors/Patient Goals Review   Personal Goals Review  Weight Management/Obesity Being able to function; do ADL's do things I want to do; live a good life.   Weight Management/Obesity Be able to function; do ADL's ; do things I want to do; live a good life.   Weight Management/Obesity Being able to function; do ADL's; do things I want to do; live a good life.   Weight Management/Obesity Be able to function; do ADL's; do things I want to do; live a good life.     Review  Patient has completed 9 sessions gaining 5 lbs since he started the program. His attendance has been inconsitent but overall, he is doing well in the program with progression. He is still knew to the program but says he feels more like doing more now. He was able to go fishing last week without getting tired. Will continue to monitor for progress.   Patient has completed 20 sessions losing 1 lb since his last 30 day review. He is doing well in the program with progression and his attendance is consistent. He says he is feeling stronger and is able to do his ADL's and the things he wants. 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 He is pleased with his progress in the program. Will continue to monitor for progress.   Patient has completed 30 sessions gaining 3 lbs since last 30 day review. He continues to do well in the program  with progression. He says he continues to feel stronger and is able to do all the things he wants to do. He continues to be pleased with his progress in the program. Will continue to monitor for progress.   Patient graduated with 36 sessions gaining 2 lbs overall. He did well in the program. His walk test improved by 7.6% and his exit measurements improved in balance and grip strenght. He says he is able to do his ADL's now without difficulty and he feels stonger and has more energy. He plans to continue exercising by  staying active at home. CR will f/u for one year.     Expected Outcomes  Patient will continue to attend sesisons more consistently and complete the program meeting his personal goals.   Patient will continue to attend sesisons more consistently and complete the program meeting his personal goals.   Patient will continue to attend sesisons more consistently and complete the program meeting his personal goals.   Patient will continue to exercise by staying active and walking at home and continue to meet his personal goals.        Exercise Goals and Review: Exercise Goals    Row Name 10/17/17 0954             Exercise Goals   Increase Physical Activity  Yes       Intervention  Provide advice, education, support and counseling about physical activity/exercise needs.       Expected Outcomes  Short Term: Attend rehab on a regular basis to increase amount of physical activity.;Long Term: Add in home exercise to make exercise part of routine and to increase amount of physical activity.;Long Term: Exercising regularly at least 3-5 days a week.       Increase Strength and Stamina  Yes       Intervention  Provide advice, education, support and counseling about physical activity/exercise needs.       Expected Outcomes  Short Term: Increase workloads from initial exercise prescription for resistance, speed, and METs.;Long Term: Improve cardiorespiratory fitness, muscular endurance and strength as measured by increased METs and functional capacity (6MWT)       Able to understand and use rate of perceived exertion (RPE) scale  Yes       Intervention  Provide education and explanation on how to use RPE scale       Expected Outcomes  Short Term: Able to use RPE daily in rehab to express subjective intensity level;Long Term:  Able to use RPE to guide intensity level when exercising independently       Knowledge and understanding of Target Heart Rate Range (THRR)  Yes       Intervention  Provide education and  explanation of THRR including how the numbers were predicted and where they are located for reference       Expected Outcomes  Short Term: Able to use daily as guideline for intensity in rehab;Long Term: Able to use THRR to govern intensity when exercising independently       Able to check pulse independently  Yes       Intervention  Provide education and demonstration on how to check pulse in carotid and radial arteries.       Expected Outcomes  Short Term: Able to explain why pulse checking is important during independent exercise;Long Term: Able to check pulse independently and accurately       Understanding of Exercise Prescription  Yes       Intervention  Provide education, explanation, and written materials on patient's individual exercise prescription       Expected Outcomes  Short Term: Able to explain program exercise prescription;Long Term: Able to explain home exercise prescription to exercise independently          Exercise Goals Re-Evaluation: Exercise Goals Re-Evaluation    Row Name 10/24/17 1614 11/15/17 0757 12/14/17 1431 01/03/18 0752 01/24/18 1550     Exercise Goal Re-Evaluation   Exercise Goals Review  Increase Physical Activity;Increase Strength and Stamina;Understanding of Exercise Prescription;Knowledge and understanding of Target Heart Rate Range (THRR)  Increase Physical Activity;Increase Strength and Stamina;Understanding of Exercise Prescription;Knowledge and understanding of Target Heart Rate Range (THRR)  Increase Physical Activity;Increase Strength and Stamina;Understanding of Exercise Prescription;Knowledge and understanding of Target Heart Rate Range (THRR)  Increase Physical Activity;Increase Strength and Stamina;Understanding of Exercise Prescription;Knowledge and understanding of Target Heart Rate Range (THRR)  Increase Physical Activity;Increase Strength and Stamina;Understanding of Exercise Prescription;Knowledge and understanding of Target Heart Rate Range (THRR)    Comments  Patient just started the program. He completed 2 visit. Will continue to monitor his progress  Patient is still pretty new to the program, having completed 8 sessions due to having to be out some. He has tolerated all of his progressions well and getting stronger everyday. Has already been able to get back in to fishing and is very excited about that.   Patient has continued to work hard and progress throught the program. He has attended regularly to help build stamina. He is up to 2.3 MPH on the treadmill with ease. We will continue to monitor his progress.   Patient continues to work hard in cardiac rehab every session. He has stated that he feels stronger and able to do more around the house. Once the weather is better he plans to get back out golfing. We will continue to monitor and progress him through the program.  Pt. has worked hard throughout his time in the program. He has one last exercise session tomorrow. He has plans to exercise at the [facility] after graduation.    Expected Outcomes  To increase in functional capicity and ADL's. Wants to get back to doing the things he wants to do.   Increase in functional capacity and ablilty to get back in to doing the things that he wants to do such as golf and fishing.   Increase in functional capacity and ablilty to get back in to doing the things that he wants to do such as golf and fishing.   Increase in functional capacity and ablilty to get back in to doing the things that he wants to do such as golf and fishing.   Increase in functional capacity and ablilty to get back in to doing the things that he wants to do such as golf and fishing.       Nutrition & Weight - Outcomes: Pre Biometrics - 10/17/17 0857      Pre Biometrics   Height  5' 10"  (1.778 m)    Waist Circumference  40.5 inches    Hip Circumference  36 inches    Waist to Hip Ratio  1.12 %    Triceps Skinfold  4 mm    % Body Fat  22.4 %    Grip Strength  34.9 kg     Flexibility  0 in   bulging discs in back    Single Leg Stand  8 seconds      Post Biometrics - 01/25/18 1320  Post  Biometrics   Height  5' 10"  (1.778 m)    Weight  86 kg    Waist Circumference  41 inches    Hip Circumference  36 inches    Waist to Hip Ratio  1.14 %    BMI (Calculated)  27.2    Triceps Skinfold  4 mm    % Body Fat  22.5 %    Grip Strength  35.7 kg    Single Leg Stand  10 seconds       Nutrition: Nutrition Therapy & Goals - 12/23/17 1425      Personal Nutrition Goals   Nutrition Goal  For heart healthy choices add >50% of whole grains, make half their plate fruits and vegetables. Discuss the difference between starchy vegetables and leafy greens, and how leafy vegetables provide fiber, helps maintain healthy weight, helps control blood glucose, and lowers cholesterol.  Discuss purchasing fresh or frozen vegetable to reduce sodium and not to add grease, fat or sugar. Consume <18oz of red meat per week. Consume lean cuts of meats and very little of meats high in sodium and nitrates such as pork and lunch meats. Discussed portion control for all food groups.      Comments  Patient attended RD class 12/22/17.       Nutrition Discharge: Nutrition Assessments - 01/26/18 1456      MEDFICTS Scores   Pre Score  21    Post Score  12    Score Difference  -9       Education Questionnaire Score: Knowledge Questionnaire Score - 01/26/18 1456      Knowledge Questionnaire Score   Pre Score  24/28    Post Score  24/24       Goals reviewed with patient; copy given to patient.

## 2018-01-26 NOTE — Progress Notes (Signed)
Cardiac Individual Treatment Plan  Patient Details  Name: Chase Guerrero MRN: [id number] Date of Birth: 1946/01/22 Referring Provider:    Initial Encounter Date:   Visit Diagnosis: S/P CABG x 2  Patient's Home Medications on Admission:  Current Outpatient Medications:  .  acetaminophen (TYLENOL) 500 MG tablet, Take 2 tablets (1,000 mg total) by mouth every 6 (six) hours as needed for mild pain or fever., Disp: 30 tablet, Rfl: 0 .  aspirin EC 81 MG tablet, Take 81 mg by mouth daily., Disp: , Rfl:  .  atorvastatin (LIPITOR) 80 MG tablet, Take 1 tablet (80 mg total) by mouth daily., Disp: 90 tablet, Rfl: 3 .  cetirizine (ZYRTEC) 10 MG tablet, Take 10 mg by mouth daily as needed for allergies., Disp: , Rfl:  .  finasteride (PROSCAR) 5 MG tablet, Take 5 mg by mouth daily., Disp: , Rfl:  .  fluticasone (FLONASE) 50 MCG/ACT nasal spray, Place 1 spray into both nostrils daily as needed for allergies or rhinitis., Disp: , Rfl:  .  furosemide (LASIX) 20 MG tablet, Take 1 tablet (20 mg total) by mouth daily as needed. Take one tab by mouth daily x 3 days, then only as needed for swelling or edema (Patient taking differently: Take 20 mg by mouth every Monday, Wednesday, and Friday. Take one tab by mouth daily x 3 days, then only as needed for swelling or edema), Disp: 90 tablet, Rfl: 0 .  Menthol, Topical Analgesic, (BIOFREEZE EX), Apply 1 application topically daily as needed (pain)., Disp: , Rfl:  .  metoprolol tartrate (LOPRESSOR) 25 MG tablet, Take 12.5 mg by mouth 2 (two) times daily. , Disp: , Rfl:  .  nitroGLYCERIN (NITROSTAT) 0.4 MG SL tablet, Place 0.4 mg under the tongue every 5 (five) minutes as needed for chest pain. , Disp: , Rfl:  .  potassium chloride SA (K-DUR,KLOR-CON) 20 MEQ tablet, Take 1 tablet (20 mEq total) by mouth daily as needed (only on the day you take the as needed Lasix). (Patient taking differently: Take 20 mEq by mouth every Monday, Wednesday, and Friday. ), Disp: 90 tablet,  Rfl: 0 .  ranitidine (ZANTAC) 150 MG tablet, Take 150 mg by mouth daily as needed for heartburn., Disp: , Rfl:  .  Soft Lens Products (SENSITIVE EYES SALINE) SOLN, Place 1 drop into both eyes 3 (three) times daily., Disp: , Rfl:   Past Medical History: Past Medical History:  Diagnosis Date  . Arthritis   . Atrial fibrillation ([place])   . Cancer ([place])    skin  . Coronary artery disease   . Enlarged prostate   . GERD (gastroesophageal reflux disease)   . Hypertension   . Myocardial infarction ([facility])    mild  . Pleural effusion   . Sleep apnea    not wearing  cpap  not worn in 9-10 yrs    Tobacco Use: Social History   Tobacco Use  Smoking Status Former Smoker  . Packs/day: 0.50  . Years: 30.00  . Pack years: 15.00  . Types: Cigarettes  Smokeless Tobacco Former Systems developer  . Types: Snuff  Tobacco Comment   USING NICORETTE GUM TO TRY AND STOP USING SMOKELESS TOBACCO    Labs: Recent Review Flowsheet Data    Labs for ITP Cardiac and Pulmonary Rehab Latest Ref Rng & Units 06/27/2017 06/27/2017 06/28/2017 06/29/2017 09/20/2017   Cholestrol 100 - 199 mg/dL - - - - 121   LDLCALC 0 - 99 mg/dL - - - - 61  HDL >39 mg/dL - - - - 43   Trlycerides 0 - 149 mg/dL - - - - 85   Hemoglobin A1c 4.8 - 5.6 % - - - - -   PHART 7.350 - 7.450 - 7.350 - - -   PCO2ART 32.0 - 48.0 mmHg - 39.9 - - -   HCO3 20.0 - 28.0 mmol/L - 22.0 - - -   TCO2 22 - 32 mmol/L _0 -   ACIDBASEDEF 0.0 - 2.0 mmol/L - 3.0(H) - - -   O2SAT % - 99.0 - - -      Capillary Blood Glucose: Lab Results  Component Value Date   GLUCAP 96 07/01/2017   GLUCAP 124 (H) 06/30/2017   GLUCAP 111 (H) 06/30/2017   GLUCAP 116 (H) 06/30/2017   GLUCAP 122 (H) 06/30/2017     Exercise Target Goals: Exercise Program Goal: Individual exercise prescription set using results from initial 6 min walk test and THRR while considering  patient's activity barriers and safety.   Exercise Prescription Goal: Starting with aerobic activity  30 plus minutes a day, 3 days per week for initial exercise prescription. Provide home exercise prescription and guidelines that participant acknowledges understanding prior to discharge.  Activity Barriers & Risk Stratification: Activity Barriers & Cardiac Risk Stratification - 10/17/17 0953      Activity Barriers & Cardiac Risk Stratification   Activity Barriers  Back Problems   shoulders worn out   Cardiac Risk Stratification  High       6 Minute Walk: 6 Minute Walk    Row Name 10/17/17 0855 01/25/18 1318       6 Minute Walk   Phase  Initial  Discharge    Distance  1300 feet  1400 feet    Distance % Change  -  7.6 %    Distance Feet Change  -  100 ft    Walk Time  6 minutes  6 minutes    # of Rest Breaks  0  0    MPH  2.46  2.65    METS  2.89  3.03    RPE  9  9    Perceived Dyspnea   7  6    VO2 Peak  9.27  9.75    Symptoms  -  No    Resting HR  63 bpm  79 bpm    Resting BP  110/50  100/60    Resting Oxygen Saturation   94 %  97 %    Exercise Oxygen Saturation  during 6 min walk  87 %  99 %    Max Ex. HR  87 bpm  85 bpm    Max Ex. BP  128/58  124/60    2 Minute Post BP  116/58  106/58       Oxygen Initial Assessment:   Oxygen Re-Evaluation:   Oxygen Discharge (Final Oxygen Re-Evaluation):   Initial Exercise Prescription:   Perform Capillary Blood Glucose checks as needed.  Exercise Prescription Changes:  Exercise Prescription Changes    Row Name 11/08/17 1200 11/29/17 1100 12/29/17 1200 01/11/18 1400 01/24/18 1500     Response to Exercise   Blood Pressure (Admit)  102/50  108/60  112/60  104/60  104/50   Blood Pressure (Exercise)  124/60  124/64  128/60  120/70  144/72   Blood Pressure (Exit)  102/60  104/58  102/54  110/58  96/54   Heart Rate (Admit)  74 bpm  65 bpm  67 bpm  72 bpm  76 bpm   Heart Rate (Exercise)  87 bpm  92 bpm  89 bpm  88 bpm  105 bpm   Heart Rate (Exit)  83 bpm  78 bpm  75 bpm  84 bpm  84 bpm   Rating of Perceived Exertion  (Exercise)  _0 Comments  -  increase in overall MET level  -  -  -   Duration  Continue with 30 min of aerobic exercise without signs/symptoms of physical distress.  Continue with 30 min of aerobic exercise without signs/symptoms of physical distress.  Continue with 30 min of aerobic exercise without signs/symptoms of physical distress.  Continue with 30 min of aerobic exercise without signs/symptoms of physical distress.  Continue with 30 min of aerobic exercise without signs/symptoms of physical distress.   Intensity  THRR unchanged  THRR unchanged  THRR unchanged  THRR unchanged  THRR unchanged     Progression   Progression  Continue to progress workloads to maintain intensity without signs/symptoms of physical distress.  Continue to progress workloads to maintain intensity without signs/symptoms of physical distress.  Continue to progress workloads to maintain intensity without signs/symptoms of physical distress.  Continue to progress workloads to maintain intensity without signs/symptoms of physical distress.  Continue to progress workloads to maintain intensity without signs/symptoms of physical distress.   Average METs  2.17  2.8  2.93  2.9  2.9     Resistance Training   Weight  _1 Reps  10-15  10-15  10-15  10-15  10-15     Treadmill   MPH  1.5  2  2.3  2.5  2.7   Grade  0  0  0  0  0   Minutes  _2 METs  2.14  2.5  2.76  2.9  3     NuStep   Level  _3 SPM  95  104  96  98  98   Minutes  _4 METs  2.2  3.1  3.1  2.9  2.9      Exercise Comments:  Exercise Comments    Row Name 10/24/17 1618 11/15/17 0800 12/14/17 1432 01/03/18 0756 01/24/18 1550   Exercise Comments  Patient has just starter. Has done well his first few visits.   Patient continues to do well in the program and handle all progressions with ease.   Patient is always willing to work hard to increase workload and MET levels. He is feeling  stronger and feels he is able to do more around the house and to help his family members.   Patient continues to push himself every session. He has worked up to 2.13mh on the TM with ease.   Pt. has done well in th eprogream. He has 1 exercise session left [facility]       Exercise Goals and Review:  Exercise Goals    Row Name 10/17/17 0954             Exercise Goals   Increase Physical Activity  Yes       Intervention  Provide advice, education, support and counseling about  physical activity/exercise needs.       Expected Outcomes  Short Term: Attend rehab on a regular basis to increase amount of physical activity.;Long Term: Add in home exercise to make exercise part of routine and to increase amount of physical activity.;Long Term: Exercising regularly at least 3-5 days a week.       Increase Strength and Stamina  Yes       Intervention  Provide advice, education, support and counseling about physical activity/exercise needs.       Expected Outcomes  Short Term: Increase workloads from initial exercise prescription for resistance, speed, and METs.;Long Term: Improve cardiorespiratory fitness, muscular endurance and strength as measured by increased METs and functional capacity (6MWT)       Able to understand and use rate of perceived exertion (RPE) scale  Yes       Intervention  Provide education and explanation on how to use RPE scale       Expected Outcomes  Short Term: Able to use RPE daily in rehab to express subjective intensity level;Long Term:  Able to use RPE to guide intensity level when exercising independently       Knowledge and understanding of Target Heart Rate Range (THRR)  Yes       Intervention  Provide education and explanation of THRR including how the numbers were predicted and where they are located for reference       Expected Outcomes  Short Term: Able to use daily as guideline for intensity in rehab;Long Term: Able to  use THRR to govern intensity when exercising independently       Able to check pulse independently  Yes       Intervention  Provide education and demonstration on how to check pulse in carotid and radial arteries.       Expected Outcomes  Short Term: Able to explain why pulse checking is important during independent exercise;Long Term: Able to check pulse independently and accurately       Understanding of Exercise Prescription  Yes       Intervention  Provide education, explanation, and written materials on patient's individual exercise prescription       Expected Outcomes  Short Term: Able to explain program exercise prescription;Long Term: Able to explain home exercise prescription to exercise independently          Exercise Goals Re-Evaluation : Exercise Goals Re-Evaluation    Row Name 10/24/17 1614 11/15/17 0757 12/14/17 1431 01/03/18 0752 01/24/18 1550     Exercise Goal Re-Evaluation   Exercise Goals Review  Increase Physical Activity;Increase Strength and Stamina;Understanding of Exercise Prescription;Knowledge and understanding of Target Heart Rate Range (THRR)  Increase Physical Activity;Increase Strength and Stamina;Understanding of Exercise Prescription;Knowledge and understanding of Target Heart Rate Range (THRR)  Increase Physical Activity;Increase Strength and Stamina;Understanding of Exercise Prescription;Knowledge and understanding of Target Heart Rate Range (THRR)  Increase Physical Activity;Increase Strength and Stamina;Understanding of Exercise Prescription;Knowledge and understanding of Target Heart Rate Range (THRR)  Increase Physical Activity;Increase Strength and Stamina;Understanding of Exercise Prescription;Knowledge and understanding of Target Heart Rate Range (THRR)   Comments  Patient just started the program. He completed 2 visit. Will continue to monitor his progress  Patient is still pretty new to the program, having completed 8 sessions due to having to be out some. He  has tolerated all of his progressions well and getting stronger everyday. Has already been able to get back in to fishing and is very excited about that.   Patient has  continued to work hard and progress throught the program. He has attended regularly to help build stamina. He is up to 2.3 MPH on the treadmill with ease. We will continue to monitor his progress.   Patient continues to work hard in cardiac rehab every session. He has stated that he feels stronger and able to do more around the house. Once the weather is better he plans to get back out golfing. We will continue to monitor and progress him through the program.  Pt. has worked hard throughout his time in the program. He has one last exercise session tomorrow. He has plans to exercise at the [facility] after graduation.    Expected Outcomes  To increase in functional capicity and ADL's. Wants to get back to doing the things he wants to do.   Increase in functional capacity and ablilty to get back in to doing the things that he wants to do such as golf and fishing.   Increase in functional capacity and ablilty to get back in to doing the things that he wants to do such as golf and fishing.   Increase in functional capacity and ablilty to get back in to doing the things that he wants to do such as golf and fishing.   Increase in functional capacity and ablilty to get back in to doing the things that he wants to do such as golf and fishing.        Discharge Exercise Prescription (Final Exercise Prescription Changes): Exercise Prescription Changes - 01/24/18 1500      Response to Exercise   Blood Pressure (Admit)  104/50    Blood Pressure (Exercise)  144/72    Blood Pressure (Exit)  96/54    Heart Rate (Admit)  76 bpm    Heart Rate (Exercise)  105 bpm    Heart Rate (Exit)  84 bpm    Rating of Perceived Exertion (Exercise)  11    Duration  Continue with 30 min of aerobic exercise without signs/symptoms of physical distress.    Intensity   THRR unchanged      Progression   Progression  Continue to progress workloads to maintain intensity without signs/symptoms of physical distress.    Average METs  2.9      Resistance Training   Weight  4    Reps  10-15      Treadmill   MPH  2.7    Grade  0    Minutes  17    METs  3      NuStep   Level  3    SPM  98    Minutes  22    METs  2.9       Nutrition:  Target Goals: Understanding of nutrition guidelines, daily intake of sodium <1565m, cholesterol <2051m calories 30% from fat and 7% or less from saturated fats, daily to have 5 or more servings of fruits and vegetables.  Biometrics: Pre Biometrics - 10/17/17 0857      Pre Biometrics   Height  _0  (1.778 m)    Waist Circumference  40.5 inches    Hip Circumference  36 inches    Waist to Hip Ratio  1.12 %    Triceps Skinfold  4 mm    % Body Fat  22.4 %    Grip Strength  34.9 kg    Flexibility  0 in   bulging discs in back    Single Leg Stand  8 seconds  Post Biometrics - 01/25/18 1320       Post  Biometrics   Height  _0  (1.778 m)    Weight  86 kg    Waist Circumference  41 inches    Hip Circumference  36 inches    Waist to Hip Ratio  1.14 %    BMI (Calculated)  27.2    Triceps Skinfold  4 mm    % Body Fat  22.5 %    Grip Strength  35.7 kg    Single Leg Stand  10 seconds       Nutrition Therapy Plan and Nutrition Goals: Nutrition Therapy & Goals - 12/23/17 1425      Personal Nutrition Goals   Nutrition Goal  For heart healthy choices add >50% of whole grains, make half their plate fruits and vegetables. Discuss the difference between starchy vegetables and leafy greens, and how leafy vegetables provide fiber, helps maintain healthy weight, helps control blood glucose, and lowers cholesterol.  Discuss purchasing fresh or frozen vegetable to reduce sodium and not to add grease, fat or sugar. Consume <18oz of red meat per week. Consume lean cuts of meats and very little of meats high in sodium  and nitrates such as pork and lunch meats. Discussed portion control for all food groups.      Comments  Patient attended RD class 12/22/17.       Nutrition Assessments: Nutrition Assessments - 01/26/18 1456      MEDFICTS Scores   Pre Score  21    Post Score  12    Score Difference  -9       Nutrition Goals Re-Evaluation: Nutrition Goals Re-Evaluation    [place] Name 01/09/18 1241             Goals   Current Weight  199 lb 6.4 oz (90.4 kg)       Nutrition Goal  For heart healthy choices add >50% of whole grains, make half their plate fruits and vegetables. Discuss the difference between starchy vegetables and leafy greens, and how leafy vegetables provide fiber, helps maintain healthy weight, helps control blood glucose, and lowers cholesterol.  Discuss purchasing fresh or frozen vegetable to reduce sodium and not to add grease, fat or sugar. Consume <18oz of red meat per week. Consume lean cuts of meats and very little of meats high in sodium and nitrates such as pork and lunch meats. Discussed portion control for all food groups.         Comment  Patient has gained 3 lbs since last 30 day review. He says he is eating heart healthy. Will continue to monitor.        Expected Outcome  Patient will continue to work toward meeting his nutritional goals.           Nutrition Goals Discharge (Final Nutrition Goals Re-Evaluation): Nutrition Goals Re-Evaluation - 01/09/18 1241      Goals   Current Weight  199 lb 6.4 oz (90.4 kg)    Nutrition Goal  For heart healthy choices add >50% of whole grains, make half their plate fruits and vegetables. Discuss the difference between starchy vegetables and leafy greens, and how leafy vegetables provide fiber, helps maintain healthy weight, helps control blood glucose, and lowers cholesterol.  Discuss purchasing fresh or frozen vegetable to reduce sodium and not to add grease, fat or sugar. Consume <18oz of red meat per week. Consume lean cuts of meats  and very little of meats high in  sodium and nitrates such as pork and lunch meats. Discussed portion control for all food groups.      Comment  Patient has gained 3 lbs since last 30 day review. He says he is eating heart healthy. Will continue to monitor.     Expected Outcome  Patient will continue to work toward meeting his nutritional goals.        Psychosocial: Target Goals: Acknowledge presence or absence of significant depression and/or stress, maximize coping skills, provide positive support system. Participant is able to verbalize types and ability to use techniques and skills needed for reducing stress and depression.  Initial Review & Psychosocial Screening: Initial Psych Review & Screening - 10/17/17 1007      Initial Review   Current issues with  None Identified      Family Dynamics   Good Support System?  Yes      Barriers   Psychosocial barriers to participate in program  There are no identifiable barriers or psychosocial needs.      Screening Interventions   Interventions  Encouraged to exercise    Expected Outcomes  Short Term goal: Identification and review with participant of any Quality of Life or Depression concerns found by scoring the questionnaire.;Long Term goal: The participant improves quality of Life and PHQ9 Scores as seen by post scores and/or verbalization of changes       Quality of Life Scores: Quality of Life - 01/25/18 1316      Quality of Life Scores   Health/Function Pre  15.43 %    Health/Function Post  23.6 %    Health/Function % Change  52.95 %    Socioeconomic Pre  23.17 %    Socioeconomic Post  25.71 %    Socioeconomic % Change   10.96 %    Psych/Spiritual Pre  24 %    Psych/Spiritual Post  25.07 %    Psych/Spiritual % Change  4.46 %    Family Pre  19.5 %    Family Post  26.4 %    Family % Change  35.38 %    GLOBAL Pre  19.27 %    GLOBAL Post  24.75 %    GLOBAL % Change  28.44 %      Scores of 19 and below usually indicate a  poorer quality of life in these areas.  A difference of  2-3 points is a clinically meaningful difference.  A difference of 2-3 points in the total score of the Quality of Life Index has been associated with significant improvement in overall quality of life, self-image, physical symptoms, and general health in studies assessing change in quality of life.  PHQ-9: Recent Review Flowsheet Data    Depression screen [facility] 2/9 01/26/2018 10/17/2017   Decreased Interest 0 0   Down, Depressed, Hopeless 0 0   PHQ - 2 Score 0 0   Altered sleeping 1 1   Tired, decreased energy 0 1   Change in appetite 0 0   Feeling bad or failure about yourself  0 0   Trouble concentrating 0 1   Moving slowly or fidgety/restless 0 0   Suicidal thoughts 0 0   PHQ-9 Score 1 3   Difficult doing work/chores Not difficult at all Somewhat difficult     Interpretation of Total Score  Total Score Depression Severity:  1-4 = Minimal depression, 5-9 = Mild depression, 10-14 = Moderate depression, 15-19 = Moderately severe depression, 20-27 = Severe depression   Psychosocial Evaluation  and Intervention: Psychosocial Evaluation - 01/26/18 1500      Discharge Psychosocial Assessment & Intervention   Comments  Patient has no psychosocial issues identified at discharge. His exit QOL score improved by 28.47% at 24.27 and his PHQ-9 score went from 3 to 1.        Psychosocial Re-Evaluation: Psychosocial Re-Evaluation    Row Name 11/17/17 0753 12/15/17 0745 01/09/18 1244         Psychosocial Re-Evaluation   Current issues with  None Identified  None Identified  None Identified     Comments  Patient's initial QOL score was 19.27 and his PHQ-9 score was 3 with no issues identified.  Patient's initial QOL score was 19.27 and his PHQ-9 score was 3 with no issues identified.  Patient's initial QOL score was 19.27 and his PHQ-9 score was 3 with no issues identified.     Expected Outcomes  Patient will not have any psychosocial  issues identified at discharge.   Patient will not have any psychosocial issues identified at discharge.   Patient will not have any psychosocial issues identified at discharge.      Interventions  Stress management education;Encouraged to attend Cardiac Rehabilitation for the exercise;Relaxation education  Stress management education;Encouraged to attend Cardiac Rehabilitation for the exercise;Relaxation education  Stress management education;Encouraged to attend Cardiac Rehabilitation for the exercise;Relaxation education     Continue Psychosocial Services   No Follow up required  No Follow up required  -        Psychosocial Discharge (Final Psychosocial Re-Evaluation): Psychosocial Re-Evaluation - 01/09/18 1244      Psychosocial Re-Evaluation   Current issues with  None Identified    Comments  Patient's initial QOL score was 19.27 and his PHQ-9 score was 3 with no issues identified.    Expected Outcomes  Patient will not have any psychosocial issues identified at discharge.     Interventions  Stress management education;Encouraged to attend Cardiac Rehabilitation for the exercise;Relaxation education       Vocational Rehabilitation: Provide vocational rehab assistance to qualifying candidates.   Vocational Rehab Evaluation & Intervention: Vocational Rehab - 10/17/17 1010      Initial Vocational Rehab Evaluation & Intervention   Assessment shows need for Vocational Rehabilitation  No       Education: Education Goals: Education classes will be provided on a weekly basis, covering required topics. Participant will state understanding/return demonstration of topics presented.  Learning Barriers/Preferences: Learning Barriers/Preferences - 10/17/17 1008      Learning Barriers/Preferences   Learning Barriers  None    Learning Preferences  Pictoral;Video;Written Material       Education Topics: Hypertension, Hypertension Reduction -Define heart disease and high blood pressure.  Discus how high blood pressure affects the body and ways to reduce high blood pressure.   CARDIAC REHAB PHASE II EXERCISE from 01/25/2018 in [place]  Date  12/14/17  Educator  D. Coad  Instruction Review Code  2- Demonstrated Understanding      Exercise and Your Heart -Discuss why it is important to exercise, the FITT principles of exercise, normal and abnormal responses to exercise, and how to exercise safely.   CARDIAC REHAB PHASE II EXERCISE from 01/25/2018 in [place]  Date  12/21/17  Educator  Coad  Instruction Review Code  2- Demonstrated Understanding      Angina -Discuss definition of angina, causes of angina, treatment of angina, and how to decrease risk of having angina.   CARDIAC REHAB  PHASE II EXERCISE from 01/25/2018 in [place]  Date  12/28/17  Educator  Coad  Instruction Review Code  2- Demonstrated Understanding      Cardiac Medications -Review what the following cardiac medications are used for, how they affect the body, and side effects that may occur when taking the medications.  Medications include Aspirin, Beta blockers, calcium channel blockers, ACE Inhibitors, angiotensin receptor blockers, diuretics, digoxin, and antihyperlipidemics.   CARDIAC REHAB PHASE II EXERCISE from 01/25/2018 in [place]  Date  01/04/18  Educator  Etheleen Mayhew  Instruction Review Code  2- Demonstrated Understanding      Congestive Heart Failure -Discuss the definition of CHF, how to live with CHF, the signs and symptoms of CHF, and how keep track of weight and sodium intake.   CARDIAC REHAB PHASE II EXERCISE from 01/25/2018 in [place]  Date  01/11/18  Educator  Wynetta Emery  Instruction Review Code  2- Demonstrated Understanding      Heart Disease and Intimacy -Discus the effect sexual activity has on the heart, how changes occur during intimacy as we age,  and safety during sexual activity.   CARDIAC REHAB PHASE II EXERCISE from 01/25/2018 in [place]  Date  01/18/18  Educator  Etheleen Mayhew  Instruction Review Code  2- Demonstrated Understanding      Smoking Cessation / COPD -Discuss different methods to quit smoking, the health benefits of quitting smoking, and the definition of COPD.   CARDIAC REHAB PHASE II EXERCISE from 01/25/2018 in [place]  Date  10/26/17  Educator  Etheleen Mayhew  Instruction Review Code  2- Demonstrated Understanding      Nutrition I: Fats -Discuss the types of cholesterol, what cholesterol does to the heart, and how cholesterol levels can be controlled.   Nutrition II: Labels -Discuss the different components of food labels and how to read food label   CARDIAC REHAB PHASE II EXERCISE from 01/25/2018 in [place]  Date  11/09/17  Educator  Etheleen Mayhew  Instruction Review Code  2- Demonstrated Understanding      Heart Parts/Heart Disease and PAD -Discuss the anatomy of the heart, the pathway of blood circulation through the heart, and these are affected by heart disease.   CARDIAC REHAB PHASE II EXERCISE from 01/25/2018 in [place]  Date  11/16/17  Educator  Etheleen Mayhew   Instruction Review Code  2- Demonstrated Understanding      Stress I: Signs and Symptoms -Discuss the causes of stress, how stress may lead to anxiety and depression, and ways to limit stress.   CARDIAC REHAB PHASE II EXERCISE from 01/25/2018 in [place]  Date  11/23/17  Educator  D.Coad  Instruction Review Code  2- Demonstrated Understanding      Stress II: Relaxation -Discuss different types of relaxation techniques to limit stress.   CARDIAC REHAB PHASE II EXERCISE from 01/25/2018 in [place]  Date  11/30/17  Educator  D. Coad  Instruction Review Code  2- Demonstrated Understanding       Warning Signs of Stroke / TIA -Discuss definition of a stroke, what the signs and symptoms are of a stroke, and how to identify when someone is having stroke.   CARDIAC REHAB PHASE II EXERCISE from 01/25/2018 in [place]  Date  12/07/17  Educator  Etheleen Mayhew  Instruction Review Code  2- Demonstrated Understanding  Knowledge Questionnaire Score: Knowledge Questionnaire Score - 01/26/18 1456      Knowledge Questionnaire Score   Pre Score  24/28    Post Score  24/24       Core Components/Risk Factors/Patient Goals at Admission: Personal Goals and Risk Factors at Admission - 10/17/17 1010      Core Components/Risk Factors/Patient Goals on Admission    Weight Management  Weight Maintenance    Personal Goal Other  Yes    Personal Goal  Be able to function during ADL's and be able to do things I want to do.     Intervention  Attend CR 3 x week and supplement at home exercise 2 x week.     Expected Outcomes  Reach personal goals       Core Components/Risk Factors/Patient Goals Review:  Goals and Risk Factor Review    Row Name 11/17/17 0749 12/15/17 0743 01/09/18 1242 01/26/18 1456       Core Components/Risk Factors/Patient Goals Review   Personal Goals Review  Weight Management/Obesity Being able to function; do ADL's do things I want to do; live a good life.   Weight Management/Obesity Be able to function; do ADL's ; do things I want to do; live a good life.   Weight Management/Obesity Being able to function; do ADL's; do things I want to do; live a good life.   Weight Management/Obesity Be able to function; do ADL's; do things I want to do; live a good life.     Review  Patient has completed 9 sessions gaining 5 lbs since he started the program. His attendance has been inconsitent but overall, he is doing well in the program with progression. He is still knew to the program but says he feels more like doing more now. He was able to go fishing last  week without getting tired. Will continue to monitor for progress.   Patient has completed 20 sessions losing 1 lb since his last 30 day review. He is doing well in the program with progression and his attendance is consistent. He says he is feeling stronger and is able to do his ADL's and the things he wants. 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 He is pleased with his progress in the program. Will continue to monitor for progress.   Patient has completed 30 sessions gaining 3 lbs since last 30 day review. He continues to do well in the program with progression. He says he continues to feel stronger and is able to do all the things he wants to do. He continues to be pleased with his progress in the program. Will continue to monitor for progress.   Patient graduated with 36 sessions gaining 2 lbs overall. He did well in the program. His walk test improved by 7.6% and his exit measurements improved in balance and grip strenght. He says he is able to do his ADL's now without difficulty and he feels stonger and has more energy. He plans to continue exercising by staying active at home. CR will f/u for one year.     Expected Outcomes  Patient will continue to attend sesisons more consistently and complete the program meeting his personal goals.   Patient will continue to attend sesisons more consistently and complete the program meeting his personal goals.   Patient will continue to attend sesisons more consistently and complete the program meeting his personal goals.   Patient will continue to exercise by staying active and walking  at home and continue to meet his personal goals.        Core Components/Risk Factors/Patient Goals at Discharge (Final Review):  Goals and Risk Factor Review - 01/26/18 1456      Core Components/Risk Factors/Patient Goals Review   Personal Goals Review  Weight Management/Obesity   Be able to function; do ADL's; do things I want to do; live a good life.    Review   Patient graduated with 36 sessions gaining 2 lbs overall. He did well in the program. His walk test improved by 7.6% and his exit measurements improved in balance and grip strenght. He says he is able to do his ADL's now without difficulty and he feels stonger and has more energy. He plans to continue exercising by staying active at home. CR will f/u for one year.     Expected Outcomes  Patient will continue to exercise by staying active and walking at home and continue to meet his personal goals.        ITP Comments: ITP Comments    Row Name 10/26/17 0746           ITP Comments  Patient is new to program. He has completed 2 sessions. Will continue to monitor for progress.           Comments: Patient graduated from [facility] today on 01/25/18 after completing 36 sessions. He achieved LTG of 30 minutes of aerobic exercise at Max Met level of 3.06. All patients vitals are WNL. Patient has met with dietician. Discharge instruction has been reviewed in detail and patient stated an understanding of material given. Patient plans to continue exercising at home by walking and staying active. Cardiac Rehab staff will make f/u calls at 1 month, 6 months, and 1 year. Patient had no complaints of any abnormal S/S or pain on their exit visit.

## 2018-01-27 DIAGNOSIS — M545 Low back pain: Secondary | ICD-10-CM | POA: Diagnosis not present

## 2018-01-27 DIAGNOSIS — M9901 Segmental and somatic dysfunction of cervical region: Secondary | ICD-10-CM | POA: Diagnosis not present

## 2018-01-27 DIAGNOSIS — M9902 Segmental and somatic dysfunction of thoracic region: Secondary | ICD-10-CM | POA: Diagnosis not present

## 2018-01-27 DIAGNOSIS — S134XXA Sprain of ligaments of cervical spine, initial encounter: Secondary | ICD-10-CM | POA: Diagnosis not present

## 2018-01-27 DIAGNOSIS — M47816 Spondylosis without myelopathy or radiculopathy, lumbar region: Secondary | ICD-10-CM | POA: Diagnosis not present

## 2018-01-27 DIAGNOSIS — M546 Pain in thoracic spine: Secondary | ICD-10-CM | POA: Diagnosis not present

## 2018-01-27 DIAGNOSIS — M9903 Segmental and somatic dysfunction of lumbar region: Secondary | ICD-10-CM | POA: Diagnosis not present

## 2018-02-10 DIAGNOSIS — M9901 Segmental and somatic dysfunction of cervical region: Secondary | ICD-10-CM | POA: Diagnosis not present

## 2018-02-10 DIAGNOSIS — M47816 Spondylosis without myelopathy or radiculopathy, lumbar region: Secondary | ICD-10-CM | POA: Diagnosis not present

## 2018-02-10 DIAGNOSIS — M9902 Segmental and somatic dysfunction of thoracic region: Secondary | ICD-10-CM | POA: Diagnosis not present

## 2018-02-10 DIAGNOSIS — M545 Low back pain: Secondary | ICD-10-CM | POA: Diagnosis not present

## 2018-02-10 DIAGNOSIS — M546 Pain in thoracic spine: Secondary | ICD-10-CM | POA: Diagnosis not present

## 2018-02-10 DIAGNOSIS — M9903 Segmental and somatic dysfunction of lumbar region: Secondary | ICD-10-CM | POA: Diagnosis not present

## 2018-02-10 DIAGNOSIS — S134XXA Sprain of ligaments of cervical spine, initial encounter: Secondary | ICD-10-CM | POA: Diagnosis not present

## 2018-02-24 DIAGNOSIS — M9902 Segmental and somatic dysfunction of thoracic region: Secondary | ICD-10-CM | POA: Diagnosis not present

## 2018-02-24 DIAGNOSIS — S134XXA Sprain of ligaments of cervical spine, initial encounter: Secondary | ICD-10-CM | POA: Diagnosis not present

## 2018-02-24 DIAGNOSIS — M9901 Segmental and somatic dysfunction of cervical region: Secondary | ICD-10-CM | POA: Diagnosis not present

## 2018-02-24 DIAGNOSIS — M546 Pain in thoracic spine: Secondary | ICD-10-CM | POA: Diagnosis not present

## 2018-02-24 DIAGNOSIS — M47816 Spondylosis without myelopathy or radiculopathy, lumbar region: Secondary | ICD-10-CM | POA: Diagnosis not present

## 2018-02-24 DIAGNOSIS — M545 Low back pain: Secondary | ICD-10-CM | POA: Diagnosis not present

## 2018-02-24 DIAGNOSIS — M9903 Segmental and somatic dysfunction of lumbar region: Secondary | ICD-10-CM | POA: Diagnosis not present

## 2018-03-10 DIAGNOSIS — M9901 Segmental and somatic dysfunction of cervical region: Secondary | ICD-10-CM | POA: Diagnosis not present

## 2018-03-10 DIAGNOSIS — M9902 Segmental and somatic dysfunction of thoracic region: Secondary | ICD-10-CM | POA: Diagnosis not present

## 2018-03-10 DIAGNOSIS — M9903 Segmental and somatic dysfunction of lumbar region: Secondary | ICD-10-CM | POA: Diagnosis not present

## 2018-03-10 DIAGNOSIS — M545 Low back pain: Secondary | ICD-10-CM | POA: Diagnosis not present

## 2018-03-10 DIAGNOSIS — S134XXA Sprain of ligaments of cervical spine, initial encounter: Secondary | ICD-10-CM | POA: Diagnosis not present

## 2018-03-10 DIAGNOSIS — M546 Pain in thoracic spine: Secondary | ICD-10-CM | POA: Diagnosis not present

## 2018-03-10 DIAGNOSIS — M47816 Spondylosis without myelopathy or radiculopathy, lumbar region: Secondary | ICD-10-CM | POA: Diagnosis not present

## 2018-04-03 ENCOUNTER — Ambulatory Visit: Payer: PPO | Admitting: Cardiology

## 2018-04-03 ENCOUNTER — Encounter: Payer: Self-pay | Admitting: Cardiology

## 2018-04-03 VITALS — BP 120/71 | HR 76 | Ht 70.0 in | Wt 204.8 lb

## 2018-04-03 DIAGNOSIS — I2510 Atherosclerotic heart disease of native coronary artery without angina pectoris: Secondary | ICD-10-CM

## 2018-04-03 MED ORDER — METOPROLOL TARTRATE 25 MG PO TABS: 25.0000 mg | ORAL_TABLET | Freq: Two times a day (BID) | ORAL | 6 refills | Status: DC

## 2018-04-03 NOTE — Progress Notes (Signed)
Clinical Summary Chase Guerrero is a 73 y.o.male seen today for follow up of the following medical problems.  1.CAD - seen in 05/2017 for chest pain - 05/2017 high risk nuclear stress test as reported below, referred for cath - 06/2017 cath distal LM to LAD 100%, LVEF 35-45% by LV gram, referred for CABG - 06/27/17 CABG with LIMA-LAD, SVG-diag -07/2017 echo LVEF 60-65%.    - has not been on ACE-I due to labile renal function.  - no recent chest pain. No SOB/DOE - compliant with meds     Past Medical History:  Diagnosis Date  . Arthritis   . Atrial fibrillation ([facility])   . Cancer ([facility])    skin  . Coronary artery disease   . Enlarged prostate   . GERD (gastroesophageal reflux disease)   . Hypertension   . Myocardial infarction ([facility])    mild  . Pleural effusion   . Sleep apnea    not wearing  cpap  not worn in 9-10 yrs     No Known Allergies   Current Outpatient Medications  Medication Sig Dispense Refill  . acetaminophen (TYLENOL) 500 MG tablet Take 2 tablets (1,000 mg total) by mouth every 6 (six) hours as needed for mild pain or fever. 30 tablet 0  . aspirin EC 81 MG tablet Take 81 mg by mouth daily.    Marland Kitchen atorvastatin (LIPITOR) 80 MG tablet Take 1 tablet (80 mg total) by mouth daily. 90 tablet 3  . cetirizine (ZYRTEC) 10 MG tablet Take 10 mg by mouth daily as needed for allergies.    . finasteride (PROSCAR) 5 MG tablet Take 5 mg by mouth daily.    . fluticasone (FLONASE) 50 MCG/ACT nasal spray Place 1 spray into both nostrils daily as needed for allergies or rhinitis.    . furosemide (LASIX) 20 MG tablet Take 1 tablet (20 mg total) by mouth daily as needed. Take one tab by mouth daily x 3 days, then only as needed for swelling or edema (Patient taking differently: Take 20 mg by mouth every Monday, Wednesday, and Friday. Take one tab by mouth daily x 3 days, then only as needed for swelling or edema) 90 tablet 0  . Menthol, Topical Analgesic, (BIOFREEZE EX) Apply 1  application topically daily as needed (pain).    . metoprolol tartrate (LOPRESSOR) 25 MG tablet Take 12.5 mg by mouth 2 (two) times daily.     . nitroGLYCERIN (NITROSTAT) 0.4 MG SL tablet Place 0.4 mg under the tongue every 5 (five) minutes as needed for chest pain.     . potassium chloride SA (K-DUR,KLOR-CON) 20 MEQ tablet Take 1 tablet (20 mEq total) by mouth daily as needed (only on the day you take the as needed Lasix). (Patient taking differently: Take 20 mEq by mouth every Monday, Wednesday, and Friday. ) 90 tablet 0  . ranitidine (ZANTAC) 150 MG tablet Take 150 mg by mouth daily as needed for heartburn.    . Soft Lens Products (SENSITIVE EYES SALINE) SOLN Place 1 drop into both eyes 3 (three) times daily.     No current facility-administered medications for this visit.      Past Surgical History:  Procedure Laterality Date  . CHEST TUBE INSERTION Left 08/16/2017   Procedure: INSERTION PLEURAL DRAINAGE CATHETER;  Surgeon: Ivin Poot, MD;  Location: [facility];  Service: Thoracic;  Laterality: Left;  . COLONOSCOPY N/A 07/23/2015   Procedure: COLONOSCOPY;  Surgeon: Daneil Dolin, MD;  [facility];  Service: Endoscopy;  Laterality: N/A;  10:30 Am  . CORONARY ARTERY BYPASS GRAFT N/A 06/27/2017   Procedure: CORONARY ARTERY BYPASS GRAFTING (CABG) x 2  WITH ENDOSCOPIC HARVESTING OF RIGHT SAPHENOUS VEIN;  Surgeon: Ivin Poot, MD;  Location: [facility];  Service: Open Heart Surgery;  Laterality: N/A;  . IR THORACENTESIS ASP PLEURAL SPACE W/IMG GUIDE  08/08/2017  . LEFT HEART CATH AND CORONARY ANGIOGRAPHY N/A 06/14/2017   Procedure: LEFT HEART CATH AND CORONARY ANGIOGRAPHY;  Surgeon: Leonie Man, MD;  Location: [facility];  Service: Cardiovascular;  Laterality: N/A;  . REMOVAL OF PLEURAL DRAINAGE CATHETER Left 10/13/2017   Procedure: REMOVAL OF PLEURAL DRAINAGE CATHETER;  Surgeon: Ivin Poot, MD;  Location: [facility];  Service: Thoracic;  Laterality: Left;  . Right hand  surgery    . Right knee arthroscopy    . TEE WITHOUT CARDIOVERSION N/A 06/27/2017   Procedure: TRANSESOPHAGEAL ECHOCARDIOGRAM (TEE);  Surgeon: Prescott Gum, Collier Salina, MD;  Location: [facility];  Service: Open Heart Surgery;  Laterality: N/A;     No Known Allergies    Family History  Problem Relation Age of Onset  . Alzheimer's disease Mother   . Arthritis Sister   . Heart attack Brother   . Hypercholesterolemia Sister   . Liver disease Sister      Social History Chase Guerrero reports that he has quit smoking. His smoking use included cigarettes. He has a 15.00 pack-year smoking history. He has quit using smokeless tobacco.  His smokeless tobacco use included snuff. Chase Guerrero reports no history of alcohol use.   Review of Systems CONSTITUTIONAL: No weight loss, fever, chills, weakness or fatigue.  HEENT: Eyes: No visual loss, blurred vision, double vision or yellow sclerae.No hearing loss, sneezing, congestion, runny nose or sore throat.  SKIN: No rash or itching.  CARDIOVASCULAR: per hpi RESPIRATORY: No shortness of breath, cough or sputum.  GASTROINTESTINAL: No anorexia, nausea, vomiting or diarrhea. No abdominal pain or blood.  GENITOURINARY: No burning on urination, no polyuria NEUROLOGICAL: No headache, dizziness, syncope, paralysis, ataxia, numbness or tingling in the extremities. No change in bowel or bladder control.  MUSCULOSKELETAL: No muscle, back pain, joint pain or stiffness.  LYMPHATICS: No enlarged nodes. No history of splenectomy.  PSYCHIATRIC: No history of depression or anxiety.  ENDOCRINOLOGIC: No reports of sweating, cold or heat intolerance. No polyuria or polydipsia.  Marland Kitchen   Physical Examination Vitals:   04/03/18 1521  BP: 120/71  Pulse: 76  SpO2: 100%   Vitals:   04/03/18 1521  Weight: 204 lb 12.8 oz (92.9 kg)  Height: 5\' 10"  (1.778 m)    Gen: resting comfortably, no acute distress HEENT: no scleral icterus, pupils equal round and reactive, no palptable  cervical adenopathy,  CV: RRR, no m/r/g, no jvd Resp: Clear to auscultation bilaterally GI: abdomen is soft, non-tender, non-distended, normal bowel sounds, no hepatosplenomegaly MSK: extremities are warm, no edema.  Skin: warm, no rash Neuro:  no focal deficits Psych: appropriate affect   Diagnostic Studies  05/2017 nuclear stress  Blood pressure demonstrated a hypertensive response to exercise.  1 mm horizontal ST segment depressions in leads II, III, and aVF in recovery with nonspecific horizontal ST segment depressions in leads V5 and V6. There was excessive artifact with stress which limits interpretation.  Defect 1: There is a large defect of moderate severity present in the mid anterior, mid anteroseptal, mid inferoseptal, apical anterior, apical septal and apical inferior location.  Findings  consistent with a large degree of ischemia.  This is a high risk study.  Nuclear stress EF: 50%.   07/2017 echo Study Conclusions  - Left ventricle: The cavity size was normal. Wall thickness was normal. Systolic function was normal. The estimated ejection fraction was in the range of 60% to 65%. Wall motion was normal; there were no regional wall motion abnormalities. Left ventricular diastolic function parameters were normal. Doppler parameters are consistent with indeterminate ventricular filling pressure. - Aortic valve: There was mild regurgitation. - Mitral valve: There was mild regurgitation. - Right ventricle: Systolic function appeared grossly normal (RV poorly visualized, I doubt accuracy of TAPSE). - Pericardium, extracardiac: There was a left pleural effusion.    Assessment and Plan  1. CAD - medical therapy limited by labile renal function - no recent symptoms, continue current med - has been on lopressor 25mg  bid and doing well, will clarify that dose with the pharmacy as there is some confusion. - continue meds  2. Leg edema - overall  controlled, continue prn lasix  F/u 6 months      [name], M.D.

## 2018-04-03 NOTE — Patient Instructions (Addendum)
Medication Instructions:   Remain on the Lopressor 25mg  twice a day as you are already taking.   Continue all other medications.    Labwork:    Testing/Procedures:    Follow-Up:  Your physician recommends that you schedule a follow-up appointment in:  6 months.   Any Other Special Instructions Will Be Listed Below (If Applicable).  If you need a refill on your cardiac medications before your next appointment, please call your pharmacy.

## 2018-06-05 DIAGNOSIS — M9903 Segmental and somatic dysfunction of lumbar region: Secondary | ICD-10-CM | POA: Diagnosis not present

## 2018-06-05 DIAGNOSIS — M545 Low back pain: Secondary | ICD-10-CM | POA: Diagnosis not present

## 2018-06-05 DIAGNOSIS — S134XXA Sprain of ligaments of cervical spine, initial encounter: Secondary | ICD-10-CM | POA: Diagnosis not present

## 2018-06-05 DIAGNOSIS — M9902 Segmental and somatic dysfunction of thoracic region: Secondary | ICD-10-CM | POA: Diagnosis not present

## 2018-06-05 DIAGNOSIS — M9901 Segmental and somatic dysfunction of cervical region: Secondary | ICD-10-CM | POA: Diagnosis not present

## 2018-06-05 DIAGNOSIS — M546 Pain in thoracic spine: Secondary | ICD-10-CM | POA: Diagnosis not present

## 2018-06-05 DIAGNOSIS — M47816 Spondylosis without myelopathy or radiculopathy, lumbar region: Secondary | ICD-10-CM | POA: Diagnosis not present

## 2018-06-13 DIAGNOSIS — E785 Hyperlipidemia, unspecified: Secondary | ICD-10-CM | POA: Diagnosis not present

## 2018-06-13 DIAGNOSIS — N183 Chronic kidney disease, stage 3 (moderate): Secondary | ICD-10-CM | POA: Diagnosis not present

## 2018-06-13 DIAGNOSIS — Z79899 Other long term (current) drug therapy: Secondary | ICD-10-CM | POA: Diagnosis not present

## 2018-06-13 DIAGNOSIS — Z125 Encounter for screening for malignant neoplasm of prostate: Secondary | ICD-10-CM | POA: Diagnosis not present

## 2018-06-13 DIAGNOSIS — G4730 Sleep apnea, unspecified: Secondary | ICD-10-CM | POA: Diagnosis not present

## 2018-06-13 DIAGNOSIS — R7301 Impaired fasting glucose: Secondary | ICD-10-CM | POA: Diagnosis not present

## 2018-06-20 DIAGNOSIS — N183 Chronic kidney disease, stage 3 (moderate): Secondary | ICD-10-CM | POA: Diagnosis not present

## 2018-06-20 DIAGNOSIS — I2510 Atherosclerotic heart disease of native coronary artery without angina pectoris: Secondary | ICD-10-CM | POA: Diagnosis not present

## 2018-06-20 DIAGNOSIS — N401 Enlarged prostate with lower urinary tract symptoms: Secondary | ICD-10-CM | POA: Diagnosis not present

## 2018-06-20 DIAGNOSIS — R7301 Impaired fasting glucose: Secondary | ICD-10-CM | POA: Diagnosis not present

## 2018-06-20 DIAGNOSIS — E785 Hyperlipidemia, unspecified: Secondary | ICD-10-CM | POA: Diagnosis not present

## 2018-06-27 DIAGNOSIS — N62 Hypertrophy of breast: Secondary | ICD-10-CM | POA: Diagnosis not present

## 2018-07-27 IMAGING — DX DG CHEST 1V PORT
1 series · 1 of 1 positions shown · non-contrast
Comparison: June 24, 2017

CLINICAL DATA: Status post coronary artery bypass grafting.
Hypoxia.

EXAM:
PORTABLE CHEST 1 VIEW

[chest]
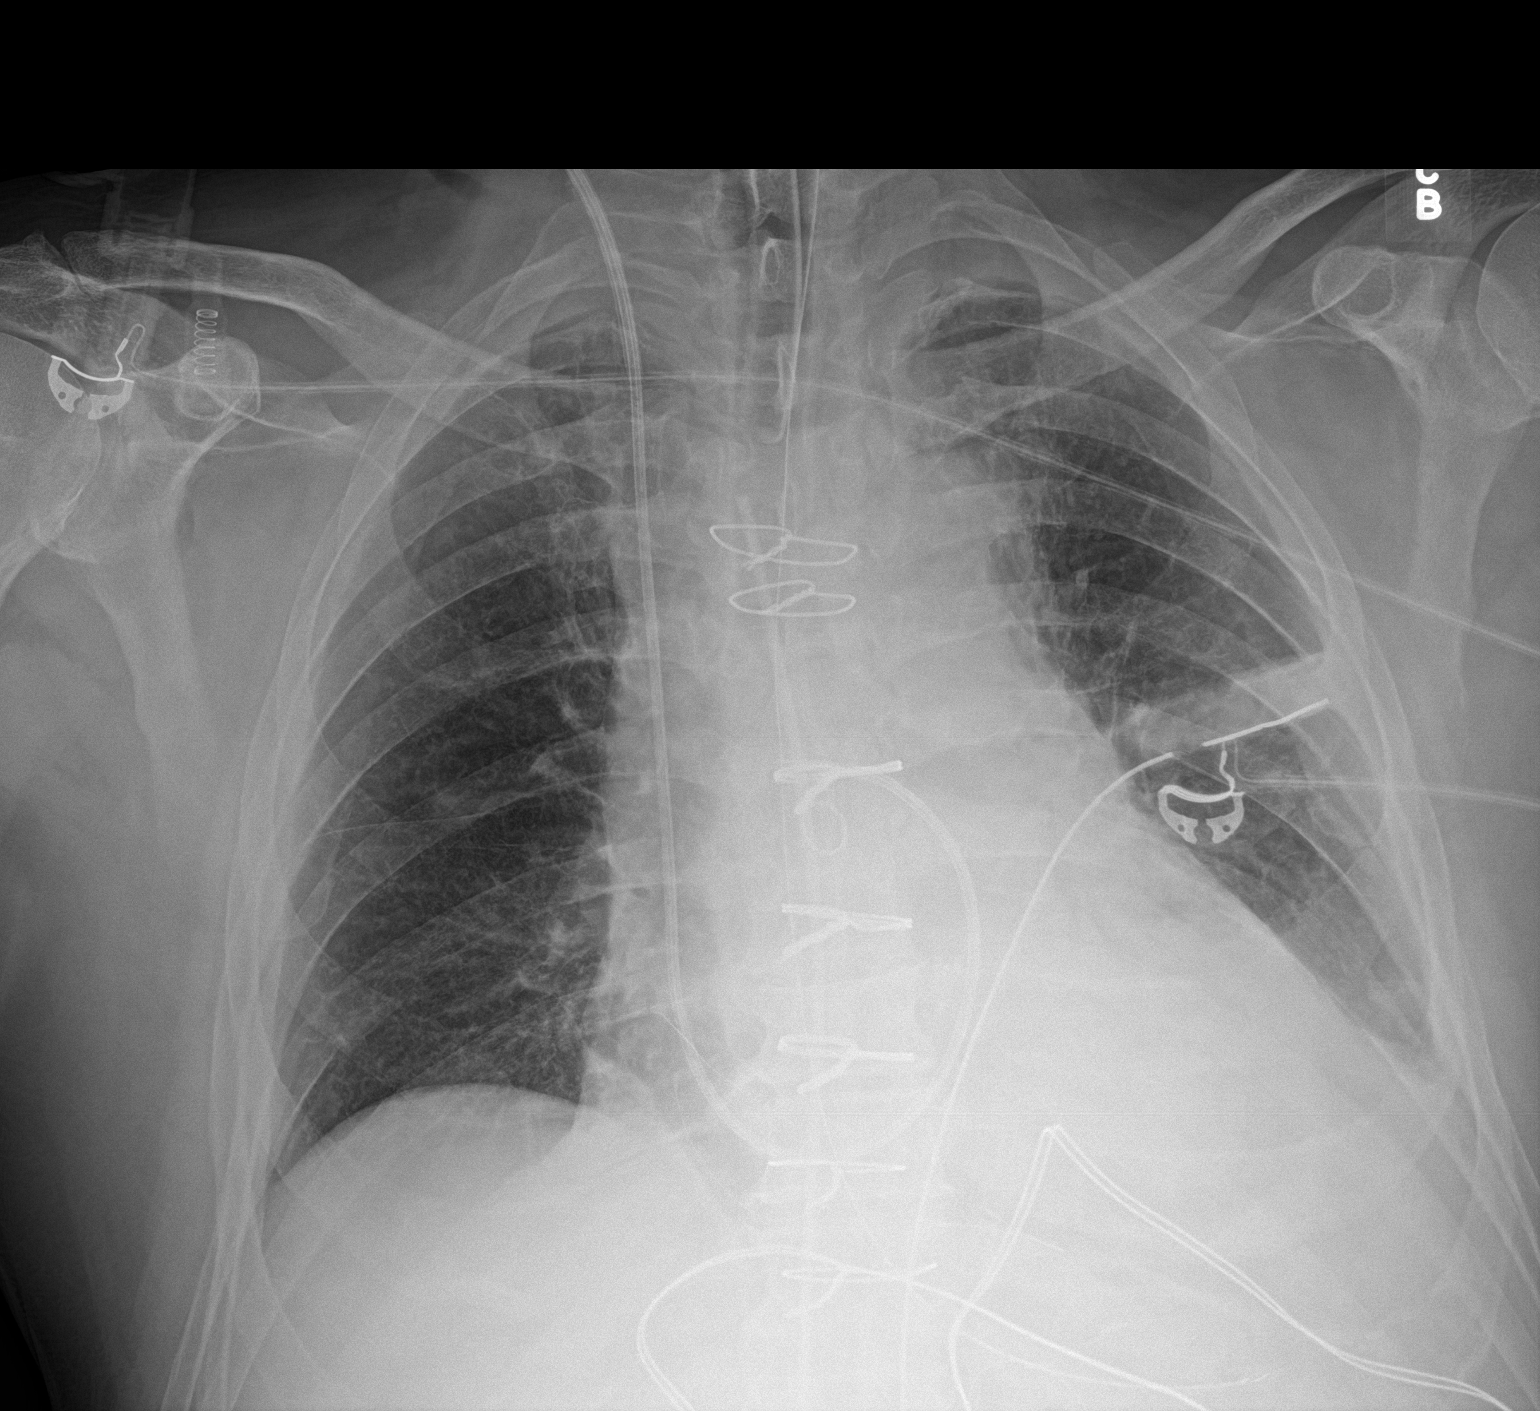

[1 of 1 positions shown; findings below may reference images not displayed]

FINDINGS: Endotracheal tube tip is 5.6 cm above the carina. Swan-Ganz catheter
tip is in the right main pulmonary outflow tract. There is a
mediastinal drain as well as a left chest tube. Nasogastric tube tip
and side port are in the stomach. There are temporary pacemaker
wires attached to the right heart. No pneumothorax. There is
consolidation left lower lobe with small left pleural effusion.
Lungs elsewhere clear. Heart is upper normal in size with pulmonary
vascularity normal. No adenopathy. No bone lesions. Patient is
status post coronary artery bypass grafting.
IMPRESSION: Tube and catheter positions as described without pneumothorax.
Consolidation, largely due to atelectasis, in the left lower lobe
with small left pleural effusion. A degree of pneumonia in the left
base cannot be excluded. Lungs elsewhere clear. Heart upper normal
in size.

## 2018-07-28 IMAGING — DX DG CHEST 1V PORT
1 series · 1 of 1 positions shown · non-contrast
Comparison: 06/27/2017

CLINICAL DATA: Status post CABG, chest tube

EXAM:
PORTABLE CHEST 1 VIEW

[chest]
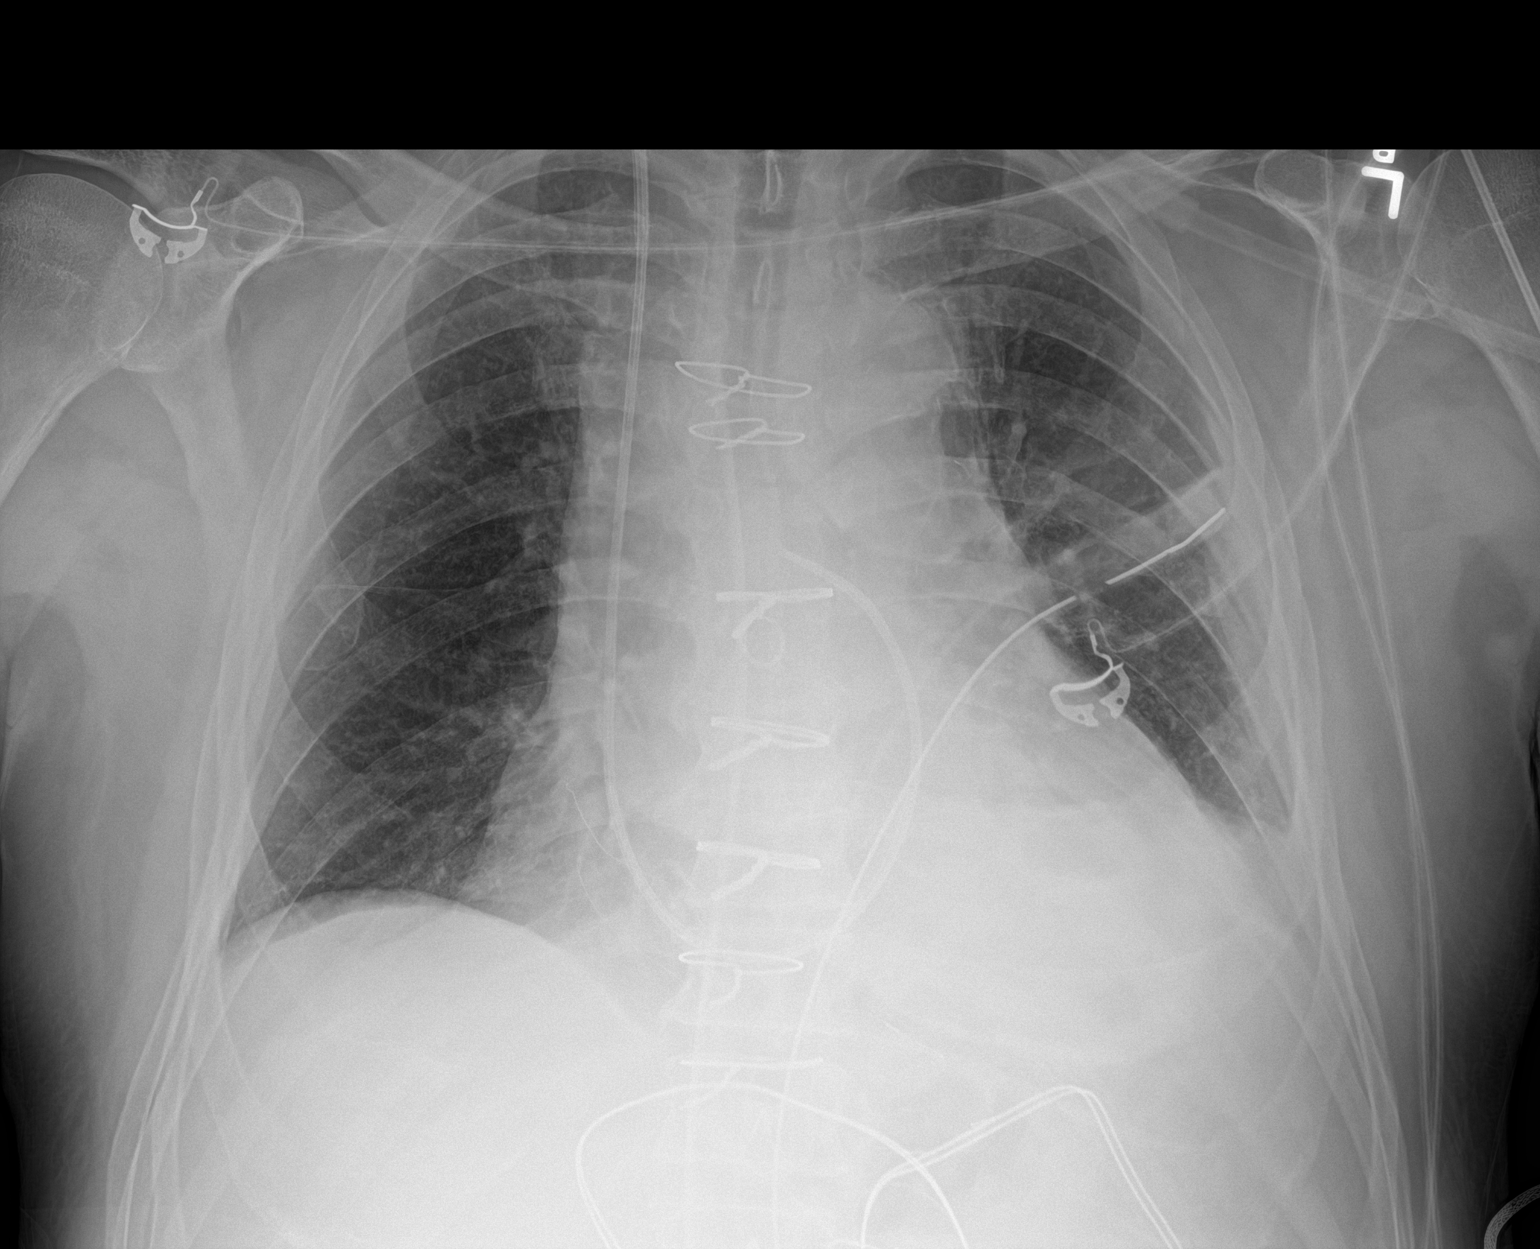

[1 of 1 positions shown; findings below may reference images not displayed]

FINDINGS: Left chest tube and mediastinal drain.  No pneumothorax is seen.

Small left pleural effusion.

Right IJ Swan-Ganz catheter terminates in the main pulmonary artery.

Interval extubation.

Cardiomegaly.  Postsurgical changes related to prior CABG.

Median sternotomy.
IMPRESSION: Postsurgical changes related to prior CABG.

Left chest tube and mediastinal drain.  No pneumothorax is seen.

Additional support apparatus as above.

## 2018-08-28 DIAGNOSIS — S134XXA Sprain of ligaments of cervical spine, initial encounter: Secondary | ICD-10-CM | POA: Diagnosis not present

## 2018-08-28 DIAGNOSIS — N400 Enlarged prostate without lower urinary tract symptoms: Secondary | ICD-10-CM | POA: Diagnosis not present

## 2018-08-28 DIAGNOSIS — M546 Pain in thoracic spine: Secondary | ICD-10-CM | POA: Diagnosis not present

## 2018-08-28 DIAGNOSIS — M9903 Segmental and somatic dysfunction of lumbar region: Secondary | ICD-10-CM | POA: Diagnosis not present

## 2018-08-28 DIAGNOSIS — N401 Enlarged prostate with lower urinary tract symptoms: Secondary | ICD-10-CM | POA: Diagnosis not present

## 2018-08-28 DIAGNOSIS — M47816 Spondylosis without myelopathy or radiculopathy, lumbar region: Secondary | ICD-10-CM | POA: Diagnosis not present

## 2018-08-28 DIAGNOSIS — M9901 Segmental and somatic dysfunction of cervical region: Secondary | ICD-10-CM | POA: Diagnosis not present

## 2018-08-28 DIAGNOSIS — M545 Low back pain: Secondary | ICD-10-CM | POA: Diagnosis not present

## 2018-08-28 DIAGNOSIS — M9902 Segmental and somatic dysfunction of thoracic region: Secondary | ICD-10-CM | POA: Diagnosis not present

## 2018-09-06 ENCOUNTER — Other Ambulatory Visit: Payer: Self-pay | Admitting: Cardiology

## 2018-09-06 DIAGNOSIS — H00025 Hordeolum internum left lower eyelid: Secondary | ICD-10-CM | POA: Diagnosis not present

## 2018-09-15 IMAGING — DX DG CHEST 1V PORT
1 series · 1 of 1 positions shown · non-contrast
Comparison: Earlier today

CLINICAL DATA: Chest tube placement

EXAM:
PORTABLE CHEST 1 VIEW

[chest]
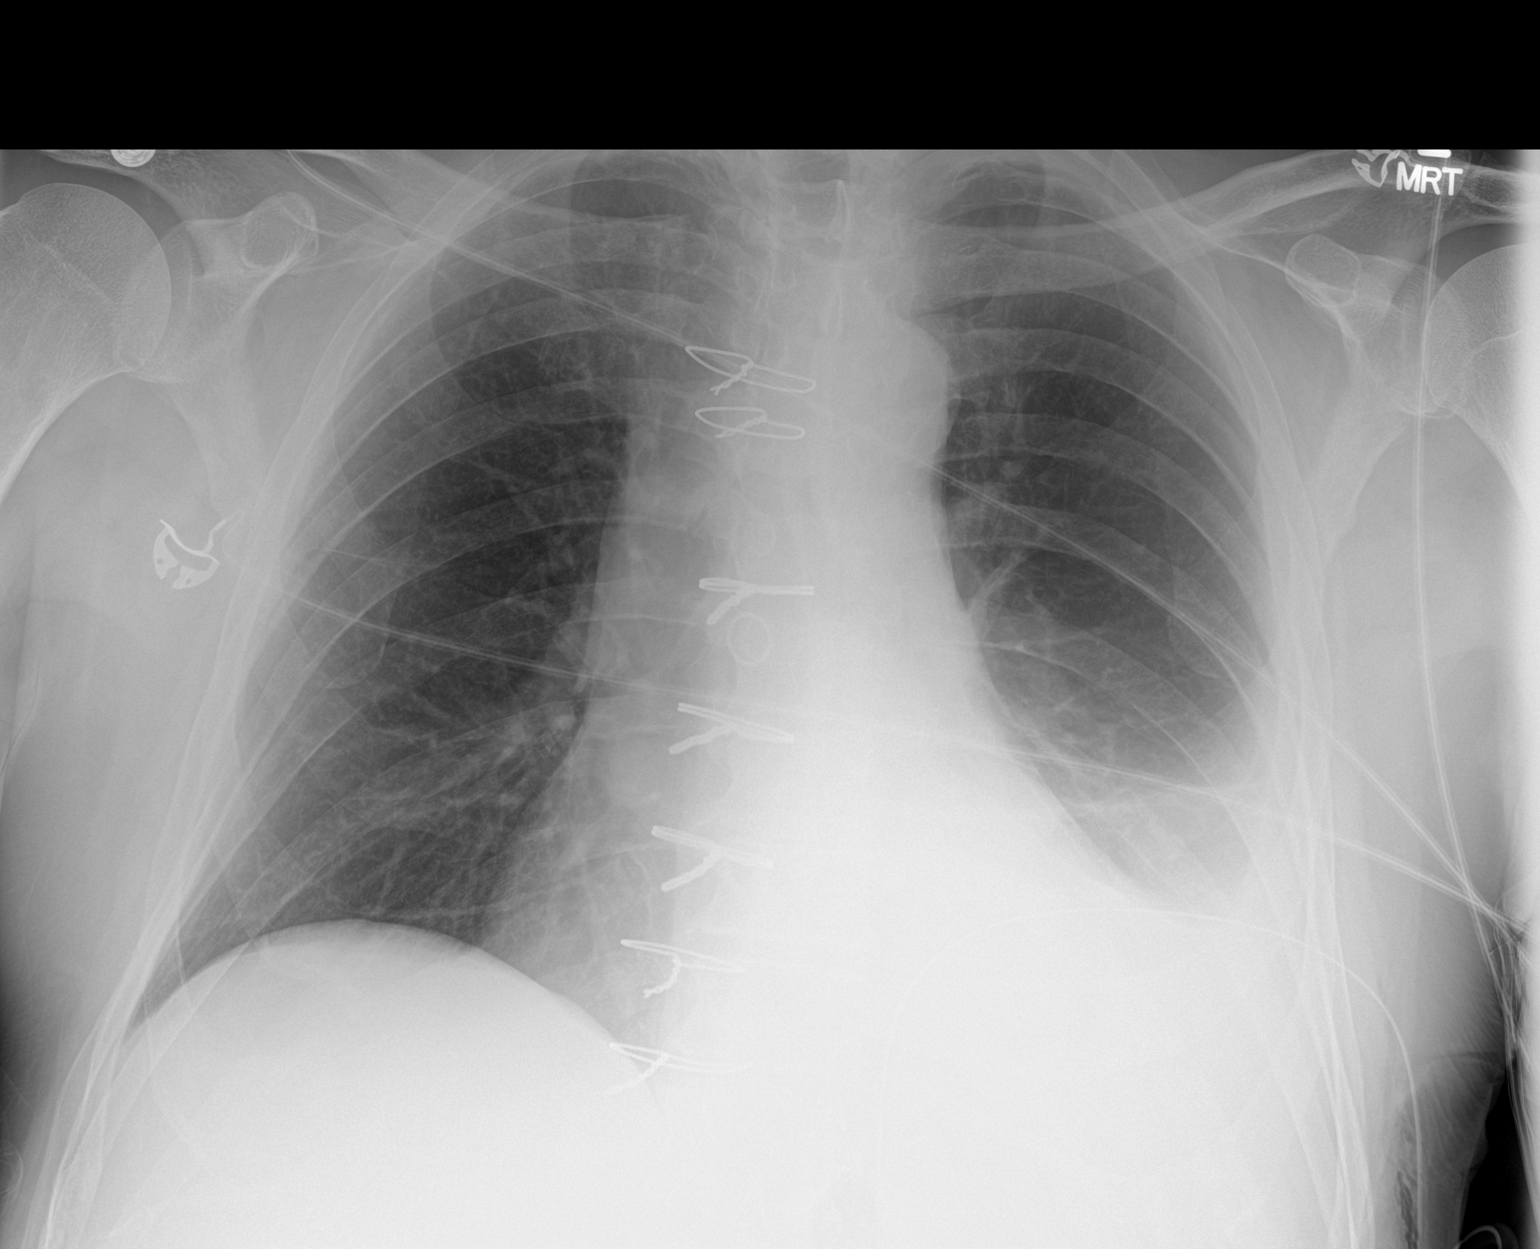

[1 of 1 positions shown; findings below may reference images not displayed]

FINDINGS: PleurX catheter on the left with tip medial and inferiorly. Decrease
in a left pleural effusion . Chronic cardiopericardial enlargement.
Status post CABG. The right lung is clear.
IMPRESSION: Decreased left pleural effusion after tunneled pleural catheter
placement.

## 2018-09-15 IMAGING — CR DG CHEST 2V
2 series · 2 of 2 positions shown · non-contrast
Comparison: Radiograph August 08, 2017.

CLINICAL DATA: Left pleural effusion.

EXAM:
CHEST - 2 VIEW

[w chest pa]
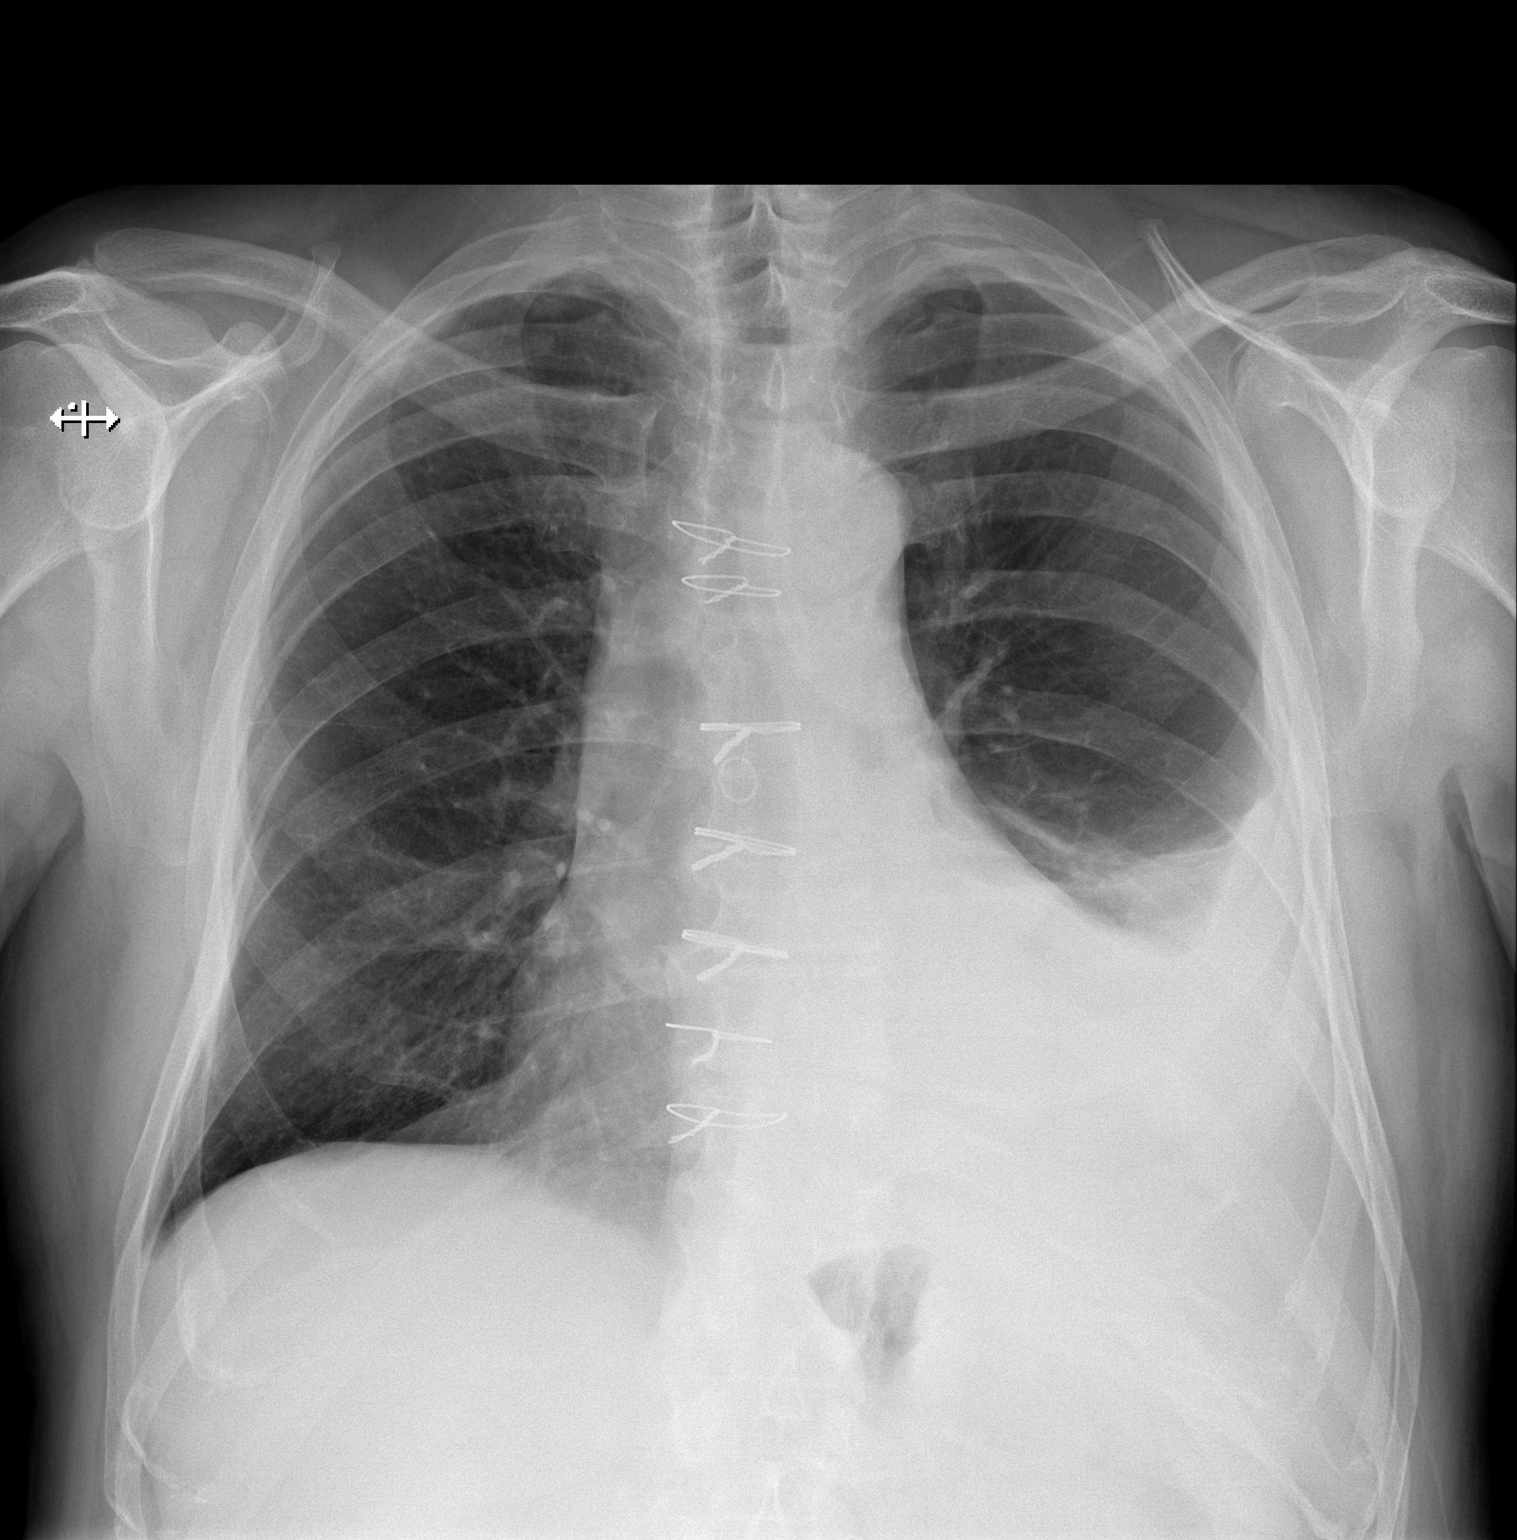

[w chest lat]
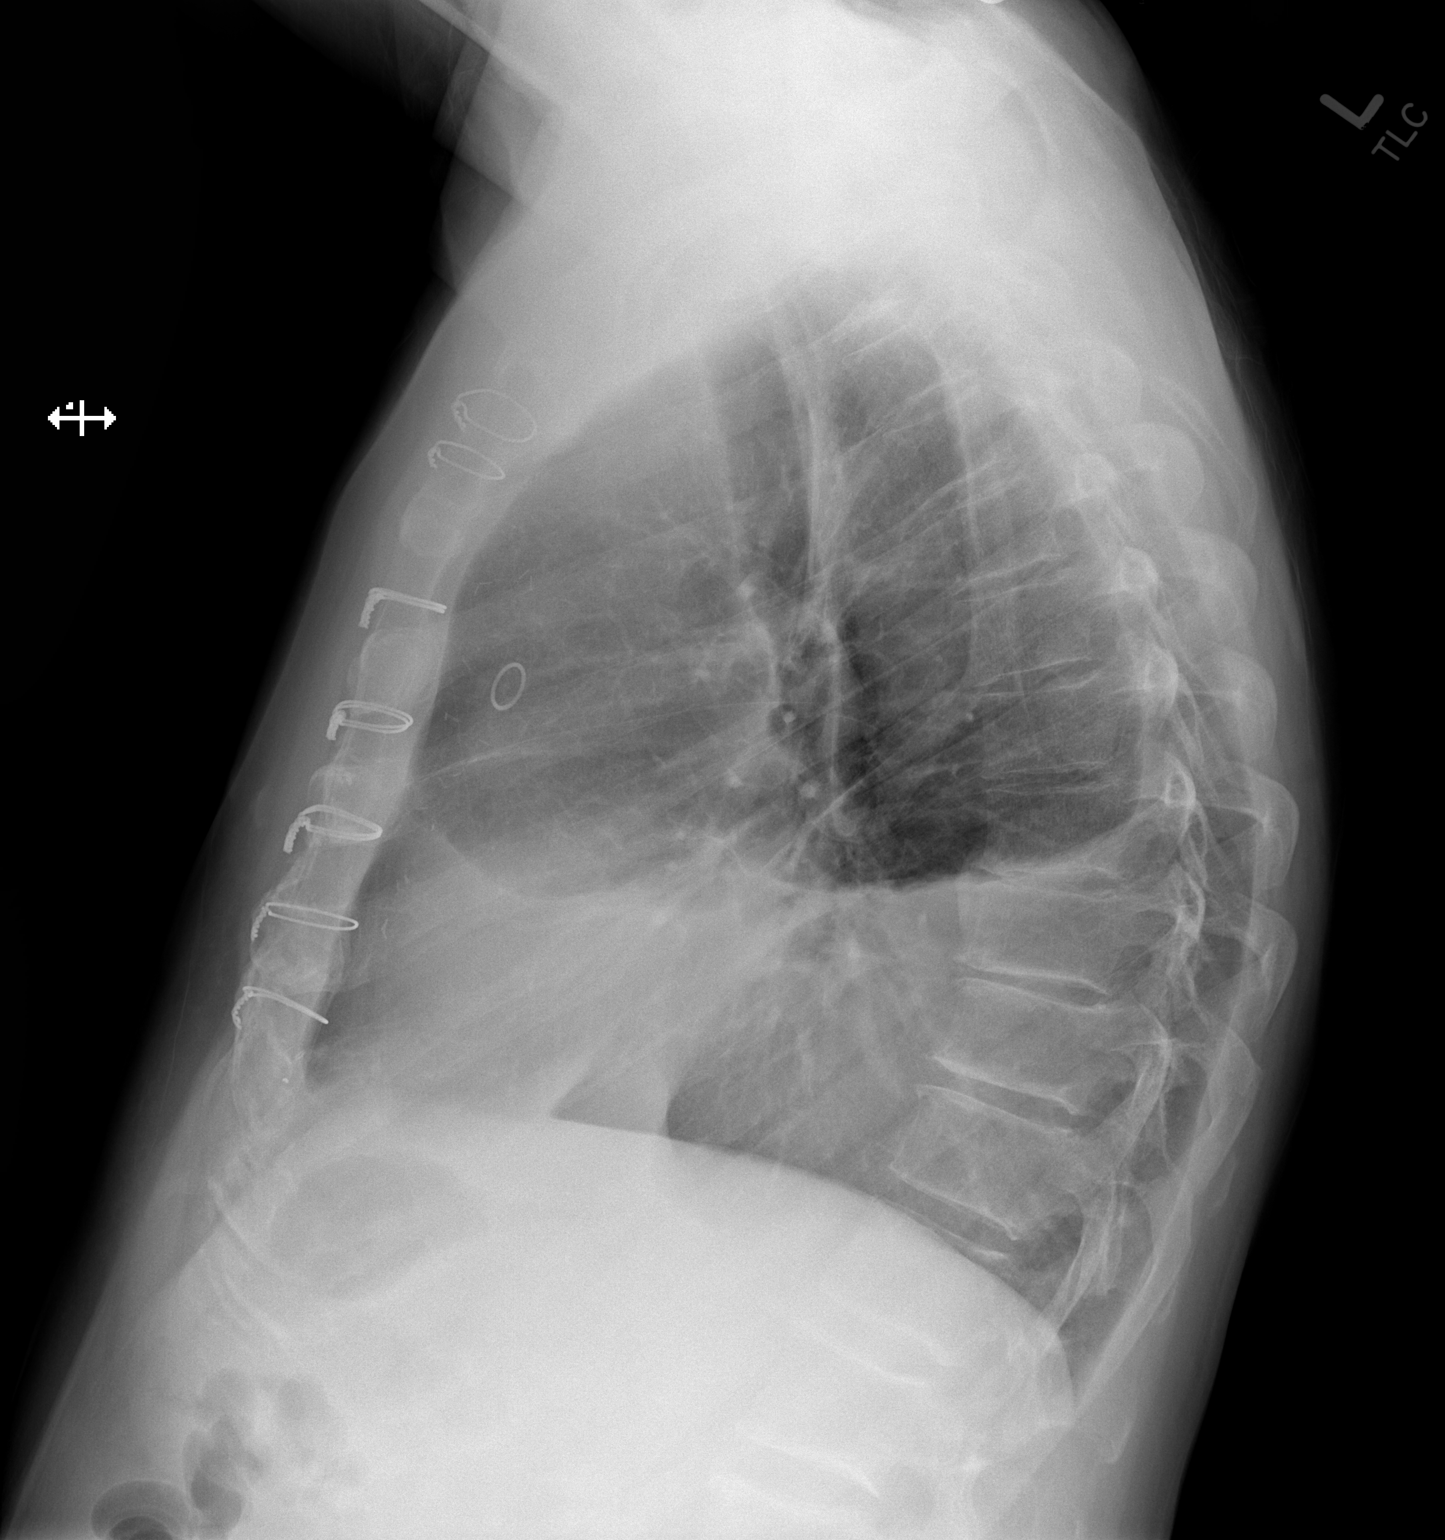

[2 of 2 positions shown; findings below may reference images not displayed]

FINDINGS: Stable cardiomegaly. Status post coronary artery bypass graft. No
pneumothorax is noted. Right lung is clear. Stable moderate size
left pleural effusion is noted with probable underlying atelectasis.
Bony thorax is unremarkable.
IMPRESSION: Stable moderate size left pleural effusion with probable underlying
atelectasis.

## 2018-10-12 ENCOUNTER — Telehealth: Payer: Self-pay | Admitting: Cardiology

## 2018-10-12 NOTE — Telephone Encounter (Signed)

## 2018-10-17 ENCOUNTER — Telehealth (INDEPENDENT_AMBULATORY_CARE_PROVIDER_SITE_OTHER): Payer: PPO | Admitting: Cardiology

## 2018-10-17 ENCOUNTER — Encounter: Payer: Self-pay | Admitting: Cardiology

## 2018-10-17 ENCOUNTER — Other Ambulatory Visit: Payer: Self-pay

## 2018-10-17 VITALS — BP 119/70 | HR 70 | Ht 70.0 in | Wt 200.0 lb

## 2018-10-17 DIAGNOSIS — I2510 Atherosclerotic heart disease of native coronary artery without angina pectoris: Secondary | ICD-10-CM | POA: Diagnosis not present

## 2018-10-17 DIAGNOSIS — E782 Mixed hyperlipidemia: Secondary | ICD-10-CM

## 2018-10-17 MED ORDER — LISINOPRIL 2.5 MG PO TABS: 2.5000 mg | ORAL_TABLET | Freq: Every day | ORAL | 3 refills | Status: DC

## 2018-10-17 NOTE — Progress Notes (Signed)
Virtual Visit via Telephone Note   This visit type was conducted due to national recommendations for restrictions regarding the COVID-19 Pandemic (e.g. social distancing) in an effort to limit this patient's exposure and mitigate transmission in our community.  Due to his co-morbid illnesses, this patient is at least at moderate risk for complications without adequate follow up.  This format is felt to be most appropriate for this patient at this time.  The patient did not have access to video technology/had technical difficulties with video requiring transitioning to audio format only (telephone).  All issues noted in this document were discussed and addressed.  No physical exam could be performed with this format.  Please refer to the patient's chart for his  consent to telehealth for [facility].   Date:  10/17/2018   ID:  Chase Guerrero, DOB 1945/05/29, MRN [id number]  Patient Location: Home Provider Location: Office  PCP:  [name], MD  Cardiologist:  Carlyle Dolly, MD  Electrophysiologist:  None   Evaluation Performed:  Follow-Up Visit  Chief Complaint:  Follow up  History of Present Illness:    Chase Guerrero is a 73 y.o. male seen today for follow up of the following medical problems.  1.CAD - seen in 05/2017 for chest pain - 05/2017 high risk nuclear stress test as reported below, referred for cath - 06/2017 cath distal LM to LAD 100%, LVEF 35-45% by LV gram, referred for CABG - 06/27/17 CABG with LIMA-LAD, SVG-diag -07/2017 echo LVEF 60-65%.   - has not been on ACE-I due to labile renal function. Last labs have shown Cr stable around 1.3. I think the main lability was at the time of his CABG.    - denies any chest pain. No SOB or doe - compliant with meds   2. HL - 06/2018 TC 132 TG 114 HDL 34 LDL 75 - compliant with meds     The patient does not have symptoms concerning for COVID-19 infection (fever, chills, cough, or new shortness of breath).     Past Medical History:  Diagnosis Date  . Arthritis   . Atrial fibrillation ([place])   . Cancer ([place])    skin  . Coronary artery disease   . Enlarged prostate   . GERD (gastroesophageal reflux disease)   . Hypertension   . Myocardial infarction ([facility])    mild  . Pleural effusion   . Sleep apnea    not wearing  cpap  not worn in 9-10 yrs   Past Surgical History:  Procedure Laterality Date  . CHEST TUBE INSERTION Left 08/16/2017   Procedure: INSERTION PLEURAL DRAINAGE CATHETER;  Surgeon: Ivin Poot, MD;  Location: [place];  Service: Thoracic;  Laterality: Left;  . COLONOSCOPY N/A 07/23/2015   Procedure: COLONOSCOPY;  Surgeon: Daneil Dolin, MD;  [facility];  Service: Endoscopy;  Laterality: N/A;  10:30 Am  . CORONARY ARTERY BYPASS GRAFT N/A 06/27/2017   Procedure: CORONARY ARTERY BYPASS GRAFTING (CABG) x 2  WITH ENDOSCOPIC HARVESTING OF RIGHT SAPHENOUS VEIN;  Surgeon: Ivin Poot, MD;  Location: [place];  Service: Open Heart Surgery;  Laterality: N/A;  . IR THORACENTESIS ASP PLEURAL SPACE W/IMG GUIDE  08/08/2017  . LEFT HEART CATH AND CORONARY ANGIOGRAPHY N/A 06/14/2017   Procedure: LEFT HEART CATH AND CORONARY ANGIOGRAPHY;  Surgeon: Leonie Man, MD;  Location: [place] [facility];  Service: Cardiovascular;  Laterality: N/A;  . REMOVAL OF PLEURAL DRAINAGE CATHETER Left 10/13/2017  Procedure: REMOVAL OF PLEURAL DRAINAGE CATHETER;  Surgeon: Ivin Poot, MD;  Location: [place];  Service: Thoracic;  Laterality: Left;  . Right hand surgery    . Right knee arthroscopy    . TEE WITHOUT CARDIOVERSION N/A 06/27/2017   Procedure: TRANSESOPHAGEAL ECHOCARDIOGRAM (TEE);  Surgeon: Prescott Gum, Collier Salina, MD;  Location: [place];  Service: Open Heart Surgery;  Laterality: N/A;     No outpatient medications have been marked as taking for the 10/17/18 encounter (Appointment) with Chase Lenis, MD.     Allergies:   Patient has no known allergies.   Social History   Tobacco Use  .  Smoking status: Former Smoker    Packs/day: 0.50    Years: 30.00    Pack years: 15.00    Types: Cigarettes  . Smokeless tobacco: Former Systems developer    Types: Snuff  . Tobacco comment: USING NICORETTE GUM TO TRY AND STOP USING SMOKELESS TOBACCO  Substance Use Topics  . Alcohol use: No  . Drug use: No     Family Hx: The patient's family history includes Alzheimer's disease in his mother; Arthritis in his sister; Heart attack in his brother; Hypercholesterolemia in his sister; Liver disease in his sister.  ROS:   Please see the history of present illness.     All other systems reviewed and are negative.   Prior CV studies:   The following studies were reviewed today:  05/2017 nuclear stress  Blood pressure demonstrated a hypertensive response to exercise.  1 mm horizontal ST segment depressions in leads II, III, and aVF in recovery with nonspecific horizontal ST segment depressions in leads V5 and V6. There was excessive artifact with stress which limits interpretation.  Defect 1: There is a large defect of moderate severity present in the mid anterior, mid anteroseptal, mid inferoseptal, apical anterior, apical septal and apical inferior location.  Findings consistent with a large degree of ischemia.  This is a high risk study.  Nuclear stress EF: 50%.   07/2017 echo Study Conclusions  - Left ventricle: The cavity size was normal. Wall thickness was normal. Systolic function was normal. The estimated ejection fraction was in the range of 60% to 65%. Wall motion was normal; there were no regional wall motion abnormalities. Left ventricular diastolic function parameters were normal. Doppler parameters are consistent with indeterminate ventricular filling pressure. - Aortic valve: There was mild regurgitation. - Mitral valve: There was mild regurgitation. - Right ventricle: Systolic function appeared grossly normal (RV poorly visualized, I doubt accuracy of  TAPSE). - Pericardium, extracardiac: There was a left pleural effusion.  Labs/Other Tests and Data Reviewed:    EKG:  No ECG reviewed.  Recent Labs: No results found for requested labs within last 8760 hours.   Recent Lipid Panel Lab Results  Component Value Date/Time   CHOL 121 09/20/2017 09:04 AM   TRIG 85 09/20/2017 09:04 AM   HDL 43 09/20/2017 09:04 AM   CHOLHDL 2.8 09/20/2017 09:04 AM   LDLCALC 61 09/20/2017 09:04 AM    Wt Readings from Last 3 Encounters:  04/03/18 204 lb 12.8 oz (92.9 kg)  01/25/18 189 lb 9.5 oz (86 kg)  11/30/17 195 lb (88.5 kg)     Objective:    Vital Signs:   Today's Vitals   10/17/18 1143  BP: 119/70  Pulse: 70  Weight: 200 lb (90.7 kg)  Height: 5\' 10"  (1.778 m)   Body mass index is 28.7 kg/m.  Normal affect. Normal speech pattern and tone.  Comfortable, no apparent distress. No audible signs of SOB or wheezing.   ASSESSMENT & PLAN:    1. CAD - no recent symptoms, doing well almost 1.5 years out from CABG -Cr has stablized since some prior lability after his surgery, initially ACE-I was held but since Cr has stabilized would start lisinopril 2.5mg  daily. Check BMET in 2 weeks  2. Hyperlipidemia - essentialy at goal with an LDL of 75, continue current therapy. If LDL trends up over time would change to crestor    COVID-19 Education: The signs and symptoms of COVID-19 were discussed with the patient and how to seek care for testing (follow up with PCP or arrange E-visit).  The importance of social distancing was discussed today.  Time:   Today, I have spent 15 minutes with the patient with telehealth technology discussing the above problems.     Medication Adjustments/Labs and Tests Ordered: Current medicines are reviewed at length with the patient today.  Concerns regarding medicines are outlined above.   Tests Ordered: No orders of the defined types were placed in this encounter.   Medication Changes: No orders of the  defined types were placed in this encounter.   Follow Up:  In Person in 6 month(s)  Signed, Carlyle Dolly, MD  10/17/2018 10:33 AM    [place]

## 2018-10-17 NOTE — Patient Instructions (Signed)
Medication Instructions:  START LISINOPRIL 2.5 MG DAILY   Labwork: 2 WEEKS  BMET  Testing/Procedures: NONE  Follow-Up: Your physician wants you to follow-up in: 6 MONTHS.  You will receive a reminder letter in the mail two months in advance. If you don't receive a letter, please call our office to schedule the follow-up appointment.   Any Other Special Instructions Will Be Listed Below (If Applicable).     If you need a refill on your cardiac medications before your next appointment, please call your pharmacy.

## 2018-12-04 DIAGNOSIS — M9902 Segmental and somatic dysfunction of thoracic region: Secondary | ICD-10-CM | POA: Diagnosis not present

## 2018-12-04 DIAGNOSIS — M9903 Segmental and somatic dysfunction of lumbar region: Secondary | ICD-10-CM | POA: Diagnosis not present

## 2018-12-04 DIAGNOSIS — M9901 Segmental and somatic dysfunction of cervical region: Secondary | ICD-10-CM | POA: Diagnosis not present

## 2018-12-04 DIAGNOSIS — M546 Pain in thoracic spine: Secondary | ICD-10-CM | POA: Diagnosis not present

## 2018-12-04 DIAGNOSIS — S134XXA Sprain of ligaments of cervical spine, initial encounter: Secondary | ICD-10-CM | POA: Diagnosis not present

## 2018-12-04 DIAGNOSIS — M47816 Spondylosis without myelopathy or radiculopathy, lumbar region: Secondary | ICD-10-CM | POA: Diagnosis not present

## 2018-12-04 DIAGNOSIS — M545 Low back pain: Secondary | ICD-10-CM | POA: Diagnosis not present

## 2018-12-15 ENCOUNTER — Other Ambulatory Visit: Payer: Self-pay

## 2018-12-15 DIAGNOSIS — Z20822 Contact with and (suspected) exposure to covid-19: Secondary | ICD-10-CM

## 2018-12-16 LAB — NOVEL CORONAVIRUS, NAA: SARS-CoV-2, NAA: NOT DETECTED

## 2019-01-12 DIAGNOSIS — E119 Type 2 diabetes mellitus without complications: Secondary | ICD-10-CM | POA: Diagnosis not present

## 2019-02-12 DIAGNOSIS — M9902 Segmental and somatic dysfunction of thoracic region: Secondary | ICD-10-CM | POA: Diagnosis not present

## 2019-02-12 DIAGNOSIS — M9901 Segmental and somatic dysfunction of cervical region: Secondary | ICD-10-CM | POA: Diagnosis not present

## 2019-02-12 DIAGNOSIS — M47816 Spondylosis without myelopathy or radiculopathy, lumbar region: Secondary | ICD-10-CM | POA: Diagnosis not present

## 2019-02-12 DIAGNOSIS — M546 Pain in thoracic spine: Secondary | ICD-10-CM | POA: Diagnosis not present

## 2019-02-12 DIAGNOSIS — M545 Low back pain: Secondary | ICD-10-CM | POA: Diagnosis not present

## 2019-02-12 DIAGNOSIS — M9903 Segmental and somatic dysfunction of lumbar region: Secondary | ICD-10-CM | POA: Diagnosis not present

## 2019-02-12 DIAGNOSIS — S134XXA Sprain of ligaments of cervical spine, initial encounter: Secondary | ICD-10-CM | POA: Diagnosis not present

## 2019-02-20 DIAGNOSIS — H00025 Hordeolum internum left lower eyelid: Secondary | ICD-10-CM | POA: Diagnosis not present

## 2019-04-09 DIAGNOSIS — M545 Low back pain: Secondary | ICD-10-CM | POA: Diagnosis not present

## 2019-04-09 DIAGNOSIS — S134XXA Sprain of ligaments of cervical spine, initial encounter: Secondary | ICD-10-CM | POA: Diagnosis not present

## 2019-04-09 DIAGNOSIS — M47816 Spondylosis without myelopathy or radiculopathy, lumbar region: Secondary | ICD-10-CM | POA: Diagnosis not present

## 2019-04-09 DIAGNOSIS — M9901 Segmental and somatic dysfunction of cervical region: Secondary | ICD-10-CM | POA: Diagnosis not present

## 2019-04-09 DIAGNOSIS — M546 Pain in thoracic spine: Secondary | ICD-10-CM | POA: Diagnosis not present

## 2019-04-09 DIAGNOSIS — M9902 Segmental and somatic dysfunction of thoracic region: Secondary | ICD-10-CM | POA: Diagnosis not present

## 2019-04-09 DIAGNOSIS — M9903 Segmental and somatic dysfunction of lumbar region: Secondary | ICD-10-CM | POA: Diagnosis not present

## 2019-04-16 ENCOUNTER — Other Ambulatory Visit: Payer: Self-pay | Admitting: Cardiology

## 2019-06-27 DIAGNOSIS — E785 Hyperlipidemia, unspecified: Secondary | ICD-10-CM | POA: Diagnosis not present

## 2019-06-27 DIAGNOSIS — N183 Chronic kidney disease, stage 3 unspecified: Secondary | ICD-10-CM | POA: Diagnosis not present

## 2019-06-27 DIAGNOSIS — Z125 Encounter for screening for malignant neoplasm of prostate: Secondary | ICD-10-CM | POA: Diagnosis not present

## 2019-06-27 DIAGNOSIS — I2510 Atherosclerotic heart disease of native coronary artery without angina pectoris: Secondary | ICD-10-CM | POA: Diagnosis not present

## 2019-06-27 DIAGNOSIS — R7301 Impaired fasting glucose: Secondary | ICD-10-CM | POA: Diagnosis not present

## 2019-06-27 DIAGNOSIS — Z79899 Other long term (current) drug therapy: Secondary | ICD-10-CM | POA: Diagnosis not present

## 2019-06-28 DIAGNOSIS — Z6828 Body mass index (BMI) 28.0-28.9, adult: Secondary | ICD-10-CM | POA: Diagnosis not present

## 2019-06-28 DIAGNOSIS — I2510 Atherosclerotic heart disease of native coronary artery without angina pectoris: Secondary | ICD-10-CM | POA: Diagnosis not present

## 2019-06-28 DIAGNOSIS — N1831 Chronic kidney disease, stage 3a: Secondary | ICD-10-CM | POA: Diagnosis not present

## 2019-06-28 DIAGNOSIS — Z0001 Encounter for general adult medical examination with abnormal findings: Secondary | ICD-10-CM | POA: Diagnosis not present

## 2019-06-28 DIAGNOSIS — E785 Hyperlipidemia, unspecified: Secondary | ICD-10-CM | POA: Diagnosis not present

## 2019-06-28 DIAGNOSIS — N401 Enlarged prostate with lower urinary tract symptoms: Secondary | ICD-10-CM | POA: Diagnosis not present

## 2019-07-02 DIAGNOSIS — M9903 Segmental and somatic dysfunction of lumbar region: Secondary | ICD-10-CM | POA: Diagnosis not present

## 2019-07-02 DIAGNOSIS — M47816 Spondylosis without myelopathy or radiculopathy, lumbar region: Secondary | ICD-10-CM | POA: Diagnosis not present

## 2019-07-02 DIAGNOSIS — M9901 Segmental and somatic dysfunction of cervical region: Secondary | ICD-10-CM | POA: Diagnosis not present

## 2019-07-02 DIAGNOSIS — S134XXA Sprain of ligaments of cervical spine, initial encounter: Secondary | ICD-10-CM | POA: Diagnosis not present

## 2019-07-02 DIAGNOSIS — M9902 Segmental and somatic dysfunction of thoracic region: Secondary | ICD-10-CM | POA: Diagnosis not present

## 2019-07-02 DIAGNOSIS — M545 Low back pain: Secondary | ICD-10-CM | POA: Diagnosis not present

## 2019-07-02 DIAGNOSIS — M546 Pain in thoracic spine: Secondary | ICD-10-CM | POA: Diagnosis not present

## 2019-07-30 ENCOUNTER — Ambulatory Visit: Payer: PPO | Admitting: Cardiology

## 2019-07-30 ENCOUNTER — Encounter: Payer: Self-pay | Admitting: Cardiology

## 2019-07-30 ENCOUNTER — Other Ambulatory Visit: Payer: Self-pay

## 2019-07-30 VITALS — BP 98/58 | HR 58 | Ht 70.0 in | Wt 197.0 lb

## 2019-07-30 DIAGNOSIS — E782 Mixed hyperlipidemia: Secondary | ICD-10-CM

## 2019-07-30 DIAGNOSIS — I2510 Atherosclerotic heart disease of native coronary artery without angina pectoris: Secondary | ICD-10-CM | POA: Diagnosis not present

## 2019-07-30 MED ORDER — ROSUVASTATIN CALCIUM 40 MG PO TABS: 40.0000 mg | ORAL_TABLET | Freq: Every day | ORAL | 3 refills | Status: DC

## 2019-07-30 MED ORDER — METOPROLOL TARTRATE 25 MG PO TABS: 12.5000 mg | ORAL_TABLET | Freq: Two times a day (BID) | ORAL | 3 refills | Status: DC

## 2019-07-30 NOTE — Patient Instructions (Signed)
Medication Instructions:  STOP Atorvastatin   Crestor 40 mg daily   DECREASE Lopressor 12.5 mg twice a day   *If you need a refill on your cardiac medications before your next appointment, please call your pharmacy*   Lab Work: None today  If you have labs (blood work) drawn today and your tests are completely normal, you will receive your results only by:  [place] (if you have MyChart) OR  A paper copy in the mail If you have any lab test that is abnormal or we need to change your treatment, we will call you to review the results.   Testing/Procedures: None today    Follow-Up: At [facility], you and your health needs are our priority.  As part of our continuing mission to provide you with exceptional heart care, we have created designated Provider Care Teams.  These Care Teams include your primary Cardiologist (physician) and Advanced Practice Providers (APPs -  Physician Assistants and Nurse Practitioners) who all work together to provide you with the care you need, when you need it.  We recommend signing up for the patient portal called "MyChart".  Sign up information is provided on this After Visit Summary.  MyChart is used to connect with patients for Virtual Visits (Telemedicine).  Patients are able to view lab/test results, encounter notes, upcoming appointments, etc.  Non-urgent messages can be sent to your provider as well.   To learn more about what you can do with MyChart, go to [email or web address].    Your next appointment:   6 month(s)  The format for your next appointment:   In Person  Provider:   Carlyle Dolly, MD   Other Instructions None       Thank you for choosing [facility] !

## 2019-07-30 NOTE — Progress Notes (Signed)
Clinical Summary Mr. Sopp is a 74 y.o.male seen today for follow up of the following medical problems.  1.CAD - seen in 05/2017 for chest pain - 05/2017 high risk nuclear stress test as reported below, referred for cath - 06/2017 cath distal LM to LAD 100%, LVEF 35-45% by LV gram, referred for CABG - 06/27/17 CABG with LIMA-LAD, SVG-diag -07/2017 echo LVEF 60-65%.   - no recent chest pain. No SOB/DOE - compliant with meds    2. HL - 06/2018 TC 132 TG 114 HDL 34 LDL 75  06/2019 TC 145 TG 130 HDL 36 LDL 86 - compliant with statin   3. Dysphagia - feeling of food getting stuck - he is going to discuss with pcp at [facility]  4. Dizziness - occurs with standing or bending over - drinks at least 4 bottles of water daily.  - home bp's  usually 90s-100s/60s.   SH: had covid vaccine x 2  Past Medical History:  Diagnosis Date  . Arthritis   . Atrial fibrillation ([place])   . Cancer ([place])    skin  . Coronary artery disease   . Enlarged prostate   . GERD (gastroesophageal reflux disease)   . Hypertension   . Myocardial infarction ([facility])    mild  . Pleural effusion   . Sleep apnea    not wearing  cpap  not worn in 9-10 yrs     No Known Allergies   Current Outpatient Medications  Medication Sig Dispense Refill  . acetaminophen (TYLENOL) 500 MG tablet Take 2 tablets (1,000 mg total) by mouth every 6 (six) hours as needed for mild pain or fever. 30 tablet 0  . aspirin EC 81 MG tablet Take 81 mg by mouth daily.    Marland Kitchen atorvastatin (LIPITOR) 80 MG tablet TAKE ONE TABLET (80MG  TOTAL) BY MOUTH DAILY 90 tablet 3  . cetirizine (ZYRTEC) 10 MG tablet Take 10 mg by mouth daily as needed for allergies.    . finasteride (PROSCAR) 5 MG tablet Take 5 mg by mouth daily.    . fluticasone (FLONASE) 50 MCG/ACT nasal spray Place 1 spray into both nostrils daily as needed for allergies or rhinitis.    . furosemide (LASIX) 20 MG tablet Take 1 tablet (20 mg total) by mouth daily as needed.  Take one tab by mouth daily x 3 days, then only as needed for swelling or edema (Patient taking differently: Take 20 mg by mouth every Monday, Wednesday, and Friday. Take one tab by mouth daily x 3 days, then only as needed for swelling or edema) 90 tablet 0  . lisinopril (ZESTRIL) 2.5 MG tablet Take 1 tablet (2.5 mg total) by mouth daily. 90 tablet 3  . Menthol, Topical Analgesic, (BIOFREEZE EX) Apply 1 application topically daily as needed (pain).    . metoprolol tartrate (LOPRESSOR) 25 MG tablet TAKE ONE TABLET (25MG  TOTAL) BY MOUTH TWO TIMES DAILY 180 tablet 3  . nitroGLYCERIN (NITROSTAT) 0.4 MG SL tablet Place 0.4 mg under the tongue every 5 (five) minutes as needed for chest pain.     . potassium chloride SA (K-DUR,KLOR-CON) 20 MEQ tablet Take 1 tablet (20 mEq total) by mouth daily as needed (only on the day you take the as needed Lasix). (Patient taking differently: Take 20 mEq by mouth every Monday, Wednesday, and Friday. ) 90 tablet 0  . ranitidine (ZANTAC) 150 MG tablet Take 150 mg by mouth daily as needed for heartburn.    . Soft  Lens Products (SENSITIVE EYES SALINE) SOLN Place 1 drop into both eyes 3 (three) times daily.     No current facility-administered medications for this visit.     Past Surgical History:  Procedure Laterality Date  . CHEST TUBE INSERTION Left 08/16/2017   Procedure: INSERTION PLEURAL DRAINAGE CATHETER;  Surgeon: Ivin Poot, MD;  Location: [place];  Service: Thoracic;  Laterality: Left;  . COLONOSCOPY N/A 07/23/2015   Procedure: COLONOSCOPY;  Surgeon: Daneil Dolin, MD;  [facility];  Service: Endoscopy;  Laterality: N/A;  10:30 Am  . CORONARY ARTERY BYPASS GRAFT N/A 06/27/2017   Procedure: CORONARY ARTERY BYPASS GRAFTING (CABG) x 2  WITH ENDOSCOPIC HARVESTING OF RIGHT SAPHENOUS VEIN;  Surgeon: Ivin Poot, MD;  Location: [place];  Service: Open Heart Surgery;  Laterality: N/A;  . IR THORACENTESIS ASP PLEURAL SPACE W/IMG GUIDE  08/08/2017  . LEFT  HEART CATH AND CORONARY ANGIOGRAPHY N/A 06/14/2017   Procedure: LEFT HEART CATH AND CORONARY ANGIOGRAPHY;  Surgeon: Leonie Man, MD;  Location: [place] [facility];  Service: Cardiovascular;  Laterality: N/A;  . REMOVAL OF PLEURAL DRAINAGE CATHETER Left 10/13/2017   Procedure: REMOVAL OF PLEURAL DRAINAGE CATHETER;  Surgeon: Ivin Poot, MD;  Location: [place];  Service: Thoracic;  Laterality: Left;  . Right hand surgery    . Right knee arthroscopy    . TEE WITHOUT CARDIOVERSION N/A 06/27/2017   Procedure: TRANSESOPHAGEAL ECHOCARDIOGRAM (TEE);  Surgeon: Prescott Gum, Collier Salina, MD;  Location: [place];  Service: Open Heart Surgery;  Laterality: N/A;     No Known Allergies    Family History  Problem Relation Age of Onset  . Alzheimer's disease Mother   . Arthritis Sister   . Heart attack Brother   . Hypercholesterolemia Sister   . Liver disease Sister      Social History Mr. Gruenhagen reports that he quit smoking about 31 years ago. His smoking use included cigarettes. He has a 15.00 pack-year smoking history. He has quit using smokeless tobacco.  His smokeless tobacco use included snuff. Mr. Tortorella reports no history of alcohol use.   Review of Systems CONSTITUTIONAL: No weight loss, fever, chills, weakness or fatigue.  HEENT: Eyes: No visual loss, blurred vision, double vision or yellow sclerae.No hearing loss, sneezing, congestion, runny nose or sore throat.  SKIN: No rash or itching.  CARDIOVASCULAR: per hpi RESPIRATORY: No shortness of breath, cough or sputum.  GASTROINTESTINAL: No anorexia, nausea, vomiting or diarrhea. No abdominal pain or blood.  GENITOURINARY: No burning on urination, no polyuria NEUROLOGICAL: No headache, dizziness, syncope, paralysis, ataxia, numbness or tingling in the extremities. No change in bowel or bladder control.  MUSCULOSKELETAL: No muscle, back pain, joint pain or stiffness.  LYMPHATICS: No enlarged nodes. No history of splenectomy.  PSYCHIATRIC: No  history of depression or anxiety.  ENDOCRINOLOGIC: No reports of sweating, cold or heat intolerance. No polyuria or polydipsia.  Marland Kitchen   Physical Examination Today's Vitals   07/30/19 0902  BP: (!) 98/58  Pulse: (!) 58  SpO2: 98%  Weight: 197 lb (89.4 kg)  Height: 5\' 10"  (1.778 m)   Body mass index is 28.27 kg/m.  Gen: resting comfortably, no acute distress HEENT: no scleral icterus, pupils equal round and reactive, no palptable cervical adenopathy,  CV: RRR, no m/r/g, no jvd Resp: Clear to auscultation bilaterally GI: abdomen is soft, non-tender, non-distended, normal bowel sounds, no hepatosplenomegaly MSK: extremities are warm, no edema.  Skin: warm, no rash Neuro:  no focal deficits Psych: appropriate affect   Diagnostic Studies  05/2017 nuclear stress  Blood pressure demonstrated a hypertensive response to exercise.  1 mm horizontal ST segment depressions in leads II, III, and aVF in recovery with nonspecific horizontal ST segment depressions in leads V5 and V6. There was excessive artifact with stress which limits interpretation.  Defect 1: There is a large defect of moderate severity present in the mid anterior, mid anteroseptal, mid inferoseptal, apical anterior, apical septal and apical inferior location.  Findings consistent with a large degree of ischemia.  This is a high risk study.  Nuclear stress EF: 50%.   07/2017 echo Study Conclusions  - Left ventricle: The cavity size was normal. Wall thickness was normal. Systolic function was normal. The estimated ejection fraction was in the range of 60% to 65%. Wall motion was normal; there were no regional wall motion abnormalities. Left ventricular diastolic function parameters were normal. Doppler parameters are consistent with indeterminate ventricular filling pressure. - Aortic valve: There was mild regurgitation. - Mitral valve: There was mild regurgitation. - Right ventricle: Systolic  function appeared grossly normal (RV poorly visualized, I doubt accuracy of TAPSE). - Pericardium, extracardiac: There was a left pleural effusion.   Assessment and Plan   1. CAD - no symptoms, continue current meds - some orthostatic sytmpoms, lower lopressor to 12.5mg  bid  2. Hyperlipidemia - not at goal of LDL <70, will change lipitor to crestor   F/u 6 months    [name], M.D.

## 2019-10-01 DIAGNOSIS — S134XXA Sprain of ligaments of cervical spine, initial encounter: Secondary | ICD-10-CM | POA: Diagnosis not present

## 2019-10-01 DIAGNOSIS — M545 Low back pain: Secondary | ICD-10-CM | POA: Diagnosis not present

## 2019-10-01 DIAGNOSIS — M9901 Segmental and somatic dysfunction of cervical region: Secondary | ICD-10-CM | POA: Diagnosis not present

## 2019-10-01 DIAGNOSIS — M47816 Spondylosis without myelopathy or radiculopathy, lumbar region: Secondary | ICD-10-CM | POA: Diagnosis not present

## 2019-10-01 DIAGNOSIS — M9902 Segmental and somatic dysfunction of thoracic region: Secondary | ICD-10-CM | POA: Diagnosis not present

## 2019-10-01 DIAGNOSIS — M9903 Segmental and somatic dysfunction of lumbar region: Secondary | ICD-10-CM | POA: Diagnosis not present

## 2019-10-01 DIAGNOSIS — M546 Pain in thoracic spine: Secondary | ICD-10-CM | POA: Diagnosis not present

## 2019-10-16 ENCOUNTER — Other Ambulatory Visit: Payer: Self-pay | Admitting: Cardiology

## 2019-10-23 ENCOUNTER — Encounter: Payer: Self-pay | Admitting: Physician Assistant

## 2019-10-23 ENCOUNTER — Other Ambulatory Visit: Payer: Self-pay

## 2019-10-23 ENCOUNTER — Ambulatory Visit: Payer: PPO | Admitting: Physician Assistant

## 2019-10-23 DIAGNOSIS — Z86018 Personal history of other benign neoplasm: Secondary | ICD-10-CM

## 2019-10-23 DIAGNOSIS — Z85828 Personal history of other malignant neoplasm of skin: Secondary | ICD-10-CM | POA: Diagnosis not present

## 2019-10-23 DIAGNOSIS — L570 Actinic keratosis: Secondary | ICD-10-CM | POA: Diagnosis not present

## 2019-10-23 DIAGNOSIS — D1800 Hemangioma unspecified site: Secondary | ICD-10-CM

## 2019-10-23 DIAGNOSIS — L578 Other skin changes due to chronic exposure to nonionizing radiation: Secondary | ICD-10-CM

## 2019-10-23 DIAGNOSIS — L821 Other seborrheic keratosis: Secondary | ICD-10-CM | POA: Diagnosis not present

## 2019-10-23 DIAGNOSIS — L814 Other melanin hyperpigmentation: Secondary | ICD-10-CM

## 2019-10-23 DIAGNOSIS — Z1283 Encounter for screening for malignant neoplasm of skin: Secondary | ICD-10-CM

## 2019-10-23 NOTE — Progress Notes (Signed)
   Follow-Up Visit   Subjective  Chase Guerrero is a 74 y.o. male who presents for the following: Annual Exam (skin check--mole on left side of near the ear).   The following portions of the chart were reviewed this encounter and updated as appropriate: Tobacco  Allergies  Meds  Problems  Med Hx  Surg Hx  Fam Hx      Objective  Well appearing patient in no apparent distress; mood and affect are within normal limits.  All skin waist up examined.  Objective  Left Forehead, Right Lower Back: Scars clear  Objective  Dorsum of Nose, Right Occipital Scalp: Scars clear  Objective  Left Parotid Area, Left Posterior Neck (5), Left Zygomatic Area, Mid Parietal Scalp (2): Erythematous patches with gritty scale.  Assessment & Plan  History of dysplastic nevus (2) Right Lower Back; Left Forehead  observe  History of basal cell carcinoma (BCC) (2) Right Occipital Scalp; Dorsum of Nose  observe  AK (actinic keratosis) (9) Mid Parietal Scalp (2); Left Zygomatic Area; Left Parotid Area; Left Posterior Neck (5)  Destruction of lesion - Left Parotid Area, Left Posterior Neck, Left Zygomatic Area, Mid Parietal Scalp Complexity: simple   Destruction method: cryotherapy   Informed consent: discussed and consent obtained   Timeout:  patient name, date of birth, surgical site, and procedure verified Lesion destroyed using liquid nitrogen: Yes   Cryotherapy cycles:  1 Outcome: patient tolerated procedure well with no complications   Post-procedure details: wound care instructions given    Lentigines - Scattered tan macules - Discussed due to sun exposure - Benign, observe - Call for any changes  Seborrheic Keratoses - Stuck-on, waxy, tan-brown papules and plaques  - Discussed benign etiology and prognosis. - Observe - Call for any changes   Hemangiomas - Red papules - Discussed benign nature - Observe - Call for any changes  Actinic Damage - diffuse scaly  erythematous macules with underlying dyspigmentation - Recommend daily broad spectrum sunscreen SPF 30+ to sun-exposed areas, reapply every 2 hours as needed.  - Call for new or changing lesions.    I, Cenia Zaragosa, PA-C, have reviewed all documentation's for this visit.  The documentation on 10/23/19 for the exam, diagnosis, procedures and orders are all accurate and complete.

## 2019-12-06 ENCOUNTER — Ambulatory Visit: Payer: PPO

## 2019-12-20 ENCOUNTER — Ambulatory Visit: Payer: PPO | Attending: Internal Medicine

## 2019-12-20 DIAGNOSIS — Z23 Encounter for immunization: Secondary | ICD-10-CM

## 2019-12-20 NOTE — Progress Notes (Signed)
° °  Covid-19 Vaccination Clinic  Name:  Chase Guerrero    MRN: [id number] DOB: [birth year]  12/20/2019  Chase Guerrero was observed post Covid-19 immunization for 15 minutes without incident. He was provided with Vaccine Information Sheet and instruction to access the V-Safe system.   Chase Guerrero was instructed to call 911 with any severe reactions post vaccine:  Difficulty breathing   Swelling of face and throat   A fast heartbeat   A bad rash all over body   Dizziness and weakness

## 2019-12-31 DIAGNOSIS — M546 Pain in thoracic spine: Secondary | ICD-10-CM | POA: Diagnosis not present

## 2019-12-31 DIAGNOSIS — M9901 Segmental and somatic dysfunction of cervical region: Secondary | ICD-10-CM | POA: Diagnosis not present

## 2019-12-31 DIAGNOSIS — M9903 Segmental and somatic dysfunction of lumbar region: Secondary | ICD-10-CM | POA: Diagnosis not present

## 2019-12-31 DIAGNOSIS — M47816 Spondylosis without myelopathy or radiculopathy, lumbar region: Secondary | ICD-10-CM | POA: Diagnosis not present

## 2019-12-31 DIAGNOSIS — S134XXA Sprain of ligaments of cervical spine, initial encounter: Secondary | ICD-10-CM | POA: Diagnosis not present

## 2019-12-31 DIAGNOSIS — M9902 Segmental and somatic dysfunction of thoracic region: Secondary | ICD-10-CM | POA: Diagnosis not present

## 2019-12-31 DIAGNOSIS — M545 Low back pain, unspecified: Secondary | ICD-10-CM | POA: Diagnosis not present

## 2020-01-16 ENCOUNTER — Ambulatory Visit: Payer: PPO | Admitting: Cardiology

## 2020-03-07 ENCOUNTER — Ambulatory Visit: Payer: PPO | Admitting: Cardiology

## 2020-03-07 ENCOUNTER — Other Ambulatory Visit: Payer: Self-pay

## 2020-03-07 ENCOUNTER — Encounter: Payer: Self-pay | Admitting: Cardiology

## 2020-03-07 VITALS — BP 106/52 | HR 84 | Ht 70.0 in | Wt 200.0 lb

## 2020-03-07 DIAGNOSIS — I2510 Atherosclerotic heart disease of native coronary artery without angina pectoris: Secondary | ICD-10-CM

## 2020-03-07 DIAGNOSIS — E782 Mixed hyperlipidemia: Secondary | ICD-10-CM | POA: Diagnosis not present

## 2020-03-07 NOTE — Progress Notes (Signed)
Clinical Summary Chase Guerrero is a 75 y.o.male seen today for follow up of the following medical problems.  1.CAD - seen in 05/2017 for chest pain - 05/2017 high risk nuclear stress test as reported below, referred for cath - 06/2017 cath distal LM to LAD 100%, LVEF 35-45% by LV gram, referred for CABG - 06/27/17 CABG with LIMA-LAD, SVG-diag -07/2017 echo LVEF 60-65%.   - we lowred lopressor due to some orthostatic symptms. Still ongoing - no recent chest pain. No SOB or DOE - compliant with meds   2. HL  06/2019 TC 145 TG 130 HDL 36 LDL 86 - compliant with statin  - last visit we changed lipitor to crestor.  - has not had repeat labs, followed by pcp     SH: had covid vaccine x 3    Past Medical History:  Diagnosis Date  . Arthritis   . Atrial fibrillation ([place])   . Cancer ([place])    skin  . Coronary artery disease   . Enlarged prostate   . GERD (gastroesophageal reflux disease)   . Hypertension   . Myocardial infarction ([facility])    mild  . Pleural effusion   . Sleep apnea    not wearing  cpap  not worn in 9-10 yrs     No Known Allergies   Current Outpatient Medications  Medication Sig Dispense Refill  . acetaminophen (TYLENOL) 500 MG tablet Take 2 tablets (1,000 mg total) by mouth every 6 (six) hours as needed for mild pain or fever. 30 tablet 0  . aspirin EC 81 MG tablet Take 81 mg by mouth daily.    . finasteride (PROSCAR) 5 MG tablet Take 5 mg by mouth daily. (Patient not taking: Reported on 10/23/2019)    . fluticasone (FLONASE) 50 MCG/ACT nasal spray Place 1 spray into both nostrils daily as needed for allergies or rhinitis.    Marland Kitchen lisinopril (ZESTRIL) 2.5 MG tablet TAKE ONE TABLET (2.5MG  TOTAL) BY MOUTH DAILY 90 tablet 3  . Menthol, Topical Analgesic, (BIOFREEZE EX) Apply 1 application topically daily as needed (pain).    . metoprolol tartrate (LOPRESSOR) 25 MG tablet Take 0.5 tablets (12.5 mg total) by mouth 2 (two) times daily. 90 tablet 3   . nitroGLYCERIN (NITROSTAT) 0.4 MG SL tablet Place 0.4 mg under the tongue every 5 (five) minutes as needed for chest pain.  (Patient not taking: Reported on 10/23/2019)    . potassium chloride SA (KLOR-CON) 20 MEQ tablet Take 20 mEq by mouth daily.    . rosuvastatin (CRESTOR) 40 MG tablet Take 1 tablet (40 mg total) by mouth daily. 90 tablet 3  . Soft Lens Products (SENSITIVE EYES SALINE) SOLN Place 1 drop into both eyes 3 (three) times daily.     No current facility-administered medications for this visit.     Past Surgical History:  Procedure Laterality Date  . CHEST TUBE INSERTION Left 08/16/2017   Procedure: INSERTION PLEURAL DRAINAGE CATHETER;  Surgeon: Ivin Poot, MD;  Location: [facility];  Service: Thoracic;  Laterality: Left;  . COLONOSCOPY N/A 07/23/2015   Procedure: COLONOSCOPY;  Surgeon: Daneil Dolin, MD;  [facility];  Service: Endoscopy;  Laterality: N/A;  10:30 Am  . CORONARY ARTERY BYPASS GRAFT N/A 06/27/2017   Procedure: CORONARY ARTERY BYPASS GRAFTING (CABG) x 2  WITH ENDOSCOPIC HARVESTING OF RIGHT SAPHENOUS VEIN;  Surgeon: Ivin Poot, MD;  Location: [facility];  Service: Open Heart Surgery;  Laterality: N/A;  .  IR THORACENTESIS ASP PLEURAL SPACE W/IMG GUIDE  08/08/2017  . LEFT HEART CATH AND CORONARY ANGIOGRAPHY N/A 06/14/2017   Procedure: LEFT HEART CATH AND CORONARY ANGIOGRAPHY;  Surgeon: Leonie Man, MD;  Location: [place] [facility];  Service: Cardiovascular;  Laterality: N/A;  . REMOVAL OF PLEURAL DRAINAGE CATHETER Left 10/13/2017   Procedure: REMOVAL OF PLEURAL DRAINAGE CATHETER;  Surgeon: Ivin Poot, MD;  Location: [place];  Service: Thoracic;  Laterality: Left;  . Right hand surgery    . Right knee arthroscopy    . TEE WITHOUT CARDIOVERSION N/A 06/27/2017   Procedure: TRANSESOPHAGEAL ECHOCARDIOGRAM (TEE);  Surgeon: Prescott Gum, Collier Salina, MD;  Location: [place];  Service: Open Heart Surgery;  Laterality: N/A;     No Known Allergies    Family History   Problem Relation Age of Onset  . Alzheimer's disease Mother   . Arthritis Sister   . Heart attack Brother   . Hypercholesterolemia Sister   . Liver disease Sister      Social History Mr. Dobler reports that he quit smoking about 32 years ago. His smoking use included cigarettes. He has a 15.00 pack-year smoking history. He has quit using smokeless tobacco.  His smokeless tobacco use included snuff. Mr. Kleckner reports no history of alcohol use.   Review of Systems CONSTITUTIONAL: No weight loss, fever, chills, weakness or fatigue.  HEENT: Eyes: No visual loss, blurred vision, double vision or yellow sclerae.No hearing loss, sneezing, congestion, runny nose or sore throat.  SKIN: No rash or itching.  CARDIOVASCULAR: per hpi RESPIRATORY: No shortness of breath, cough or sputum.  GASTROINTESTINAL: No anorexia, nausea, vomiting or diarrhea. No abdominal pain or blood.  GENITOURINARY: No burning on urination, no polyuria NEUROLOGICAL: No headache, dizziness, syncope, paralysis, ataxia, numbness or tingling in the extremities. No change in bowel or bladder control.  MUSCULOSKELETAL: No muscle, back pain, joint pain or stiffness.  LYMPHATICS: No enlarged nodes. No history of splenectomy.  PSYCHIATRIC: No history of depression or anxiety.  ENDOCRINOLOGIC: No reports of sweating, cold or heat intolerance. No polyuria or polydipsia.  Marland Kitchen   Physical Examination Today's Vitals   03/07/20 1359  BP: (!) 106/52  Pulse: 84  SpO2: 94%  Weight: 200 lb (90.7 kg)  Height: 5\' 10"  (1.778 m)   Body mass index is 28.7 kg/m.  Gen: resting comfortably, no acute distress HEENT: no scleral icterus, pupils equal round and reactive, no palptable cervical adenopathy,  CV: RRR, no m/r/g, no jvd Resp: Clear to auscultation bilaterally GI: abdomen is soft, non-tender, non-distended, normal bowel sounds, no hepatosplenomegaly MSK: extremities are warm, no edema.  Skin: warm, no rash Neuro:  no focal  deficits Psych: appropriate affect   Diagnostic Studies 05/2017 nuclear stress  Blood pressure demonstrated a hypertensive response to exercise.  1 mm horizontal ST segment depressions in leads II, III, and aVF in recovery with nonspecific horizontal ST segment depressions in leads V5 and V6. There was excessive artifact with stress which limits interpretation.  Defect 1: There is a large defect of moderate severity present in the mid anterior, mid anteroseptal, mid inferoseptal, apical anterior, apical septal and apical inferior location.  Findings consistent with a large degree of ischemia.  This is a high risk study.  Nuclear stress EF: 50%.   07/2017 echo Study Conclusions  - Left ventricle: The cavity size was normal. Wall thickness was normal. Systolic function was normal. The estimated ejection fraction was in the range of 60% to 65%. Wall motion was  normal; there were no regional wall motion abnormalities. Left ventricular diastolic function parameters were normal. Doppler parameters are consistent with indeterminate ventricular filling pressure. - Aortic valve: There was mild regurgitation. - Mitral valve: There was mild regurgitation. - Right ventricle: Systolic function appeared grossly normal (RV poorly visualized, I doubt accuracy of TAPSE). - Pericardium, extracardiac: There was a left pleural effusion.    Assessment and Plan  1. CAD - denies any cardiac symptoms - ongoing dizziness, will try holding lopressor x 2 weeks, he will call and update [place] on symptoms  2.Hyperlipidemia - continue crestor, request pcp labs   F/u 6 montsh   [name], M.D.

## 2020-03-07 NOTE — Patient Instructions (Signed)
Medication Instructions:  HOLD Metoprolol for 2 weeks, call us back and let us know if your dizziness is better.    *If you need a refill on your cardiac medications before your next appointment, please call your pharmacy*   Lab Work: None today If you have labs (blood work) drawn today and your tests are completely normal, you will receive your results only by: Marland Kitchen MyChart Message (if you have MyChart) OR . A paper copy in the mail If you have any lab test that is abnormal or we need to change your treatment, we will call you to review the results.   Testing/Procedures: None today   Follow-Up: At [facility], you and your health needs are our priority.  As part of our continuing mission to provide you with exceptional heart care, we have created designated Provider Care Teams.  These Care Teams include your primary Cardiologist (physician) and Advanced Practice Providers (APPs -  Physician Assistants and Nurse Practitioners) who all work together to provide you with the care you need, when you need it.  We recommend signing up for the patient portal called "MyChart".  Sign up information is provided on this After Visit Summary.  MyChart is used to connect with patients for Virtual Visits (Telemedicine).  Patients are able to view lab/test results, encounter notes, upcoming appointments, etc.  Non-urgent messages can be sent to your provider as well.   To learn more about what you can do with MyChart, go to [email or web address].    Your next appointment:   6 month(s)  The format for your next appointment:   In Person  Provider:   Carlyle Dolly, MD   Other Instructions None     Thank you for choosing [facility] !

## 2020-03-10 ENCOUNTER — Telehealth: Payer: Self-pay | Admitting: Cardiology

## 2020-03-10 NOTE — Telephone Encounter (Signed)
Error

## 2020-03-31 DIAGNOSIS — S233XXA Sprain of ligaments of thoracic spine, initial encounter: Secondary | ICD-10-CM | POA: Diagnosis not present

## 2020-03-31 DIAGNOSIS — S338XXA Sprain of other parts of lumbar spine and pelvis, initial encounter: Secondary | ICD-10-CM | POA: Diagnosis not present

## 2020-03-31 DIAGNOSIS — S134XXA Sprain of ligaments of cervical spine, initial encounter: Secondary | ICD-10-CM | POA: Diagnosis not present

## 2020-03-31 DIAGNOSIS — M9901 Segmental and somatic dysfunction of cervical region: Secondary | ICD-10-CM | POA: Diagnosis not present

## 2020-03-31 DIAGNOSIS — M9903 Segmental and somatic dysfunction of lumbar region: Secondary | ICD-10-CM | POA: Diagnosis not present

## 2020-03-31 DIAGNOSIS — M9902 Segmental and somatic dysfunction of thoracic region: Secondary | ICD-10-CM | POA: Diagnosis not present

## 2020-06-12 DIAGNOSIS — S134XXA Sprain of ligaments of cervical spine, initial encounter: Secondary | ICD-10-CM | POA: Diagnosis not present

## 2020-06-12 DIAGNOSIS — S233XXA Sprain of ligaments of thoracic spine, initial encounter: Secondary | ICD-10-CM | POA: Diagnosis not present

## 2020-06-12 DIAGNOSIS — M9901 Segmental and somatic dysfunction of cervical region: Secondary | ICD-10-CM | POA: Diagnosis not present

## 2020-06-12 DIAGNOSIS — M9902 Segmental and somatic dysfunction of thoracic region: Secondary | ICD-10-CM | POA: Diagnosis not present

## 2020-06-12 DIAGNOSIS — S338XXA Sprain of other parts of lumbar spine and pelvis, initial encounter: Secondary | ICD-10-CM | POA: Diagnosis not present

## 2020-06-12 DIAGNOSIS — M9903 Segmental and somatic dysfunction of lumbar region: Secondary | ICD-10-CM | POA: Diagnosis not present

## 2020-06-23 DIAGNOSIS — E785 Hyperlipidemia, unspecified: Secondary | ICD-10-CM | POA: Diagnosis not present

## 2020-06-23 DIAGNOSIS — Z125 Encounter for screening for malignant neoplasm of prostate: Secondary | ICD-10-CM | POA: Diagnosis not present

## 2020-06-23 DIAGNOSIS — S233XXA Sprain of ligaments of thoracic spine, initial encounter: Secondary | ICD-10-CM | POA: Diagnosis not present

## 2020-06-23 DIAGNOSIS — N183 Chronic kidney disease, stage 3 unspecified: Secondary | ICD-10-CM | POA: Diagnosis not present

## 2020-06-23 DIAGNOSIS — S338XXA Sprain of other parts of lumbar spine and pelvis, initial encounter: Secondary | ICD-10-CM | POA: Diagnosis not present

## 2020-06-23 DIAGNOSIS — R7301 Impaired fasting glucose: Secondary | ICD-10-CM | POA: Diagnosis not present

## 2020-06-23 DIAGNOSIS — M9901 Segmental and somatic dysfunction of cervical region: Secondary | ICD-10-CM | POA: Diagnosis not present

## 2020-06-23 DIAGNOSIS — Z79899 Other long term (current) drug therapy: Secondary | ICD-10-CM | POA: Diagnosis not present

## 2020-06-23 DIAGNOSIS — I2510 Atherosclerotic heart disease of native coronary artery without angina pectoris: Secondary | ICD-10-CM | POA: Diagnosis not present

## 2020-06-23 DIAGNOSIS — M9902 Segmental and somatic dysfunction of thoracic region: Secondary | ICD-10-CM | POA: Diagnosis not present

## 2020-06-23 DIAGNOSIS — M9903 Segmental and somatic dysfunction of lumbar region: Secondary | ICD-10-CM | POA: Diagnosis not present

## 2020-06-23 DIAGNOSIS — S134XXA Sprain of ligaments of cervical spine, initial encounter: Secondary | ICD-10-CM | POA: Diagnosis not present

## 2020-06-30 DIAGNOSIS — S233XXA Sprain of ligaments of thoracic spine, initial encounter: Secondary | ICD-10-CM | POA: Diagnosis not present

## 2020-06-30 DIAGNOSIS — M9903 Segmental and somatic dysfunction of lumbar region: Secondary | ICD-10-CM | POA: Diagnosis not present

## 2020-06-30 DIAGNOSIS — E785 Hyperlipidemia, unspecified: Secondary | ICD-10-CM | POA: Diagnosis not present

## 2020-06-30 DIAGNOSIS — I2510 Atherosclerotic heart disease of native coronary artery without angina pectoris: Secondary | ICD-10-CM | POA: Diagnosis not present

## 2020-06-30 DIAGNOSIS — S338XXA Sprain of other parts of lumbar spine and pelvis, initial encounter: Secondary | ICD-10-CM | POA: Diagnosis not present

## 2020-06-30 DIAGNOSIS — Z6829 Body mass index (BMI) 29.0-29.9, adult: Secondary | ICD-10-CM | POA: Diagnosis not present

## 2020-06-30 DIAGNOSIS — M9901 Segmental and somatic dysfunction of cervical region: Secondary | ICD-10-CM | POA: Diagnosis not present

## 2020-06-30 DIAGNOSIS — N1831 Chronic kidney disease, stage 3a: Secondary | ICD-10-CM | POA: Diagnosis not present

## 2020-06-30 DIAGNOSIS — R7309 Other abnormal glucose: Secondary | ICD-10-CM | POA: Diagnosis not present

## 2020-06-30 DIAGNOSIS — S134XXA Sprain of ligaments of cervical spine, initial encounter: Secondary | ICD-10-CM | POA: Diagnosis not present

## 2020-06-30 DIAGNOSIS — M9902 Segmental and somatic dysfunction of thoracic region: Secondary | ICD-10-CM | POA: Diagnosis not present

## 2020-07-08 DIAGNOSIS — S134XXA Sprain of ligaments of cervical spine, initial encounter: Secondary | ICD-10-CM | POA: Diagnosis not present

## 2020-07-08 DIAGNOSIS — M9903 Segmental and somatic dysfunction of lumbar region: Secondary | ICD-10-CM | POA: Diagnosis not present

## 2020-07-08 DIAGNOSIS — M9902 Segmental and somatic dysfunction of thoracic region: Secondary | ICD-10-CM | POA: Diagnosis not present

## 2020-07-08 DIAGNOSIS — S338XXA Sprain of other parts of lumbar spine and pelvis, initial encounter: Secondary | ICD-10-CM | POA: Diagnosis not present

## 2020-07-08 DIAGNOSIS — M9901 Segmental and somatic dysfunction of cervical region: Secondary | ICD-10-CM | POA: Diagnosis not present

## 2020-07-08 DIAGNOSIS — S233XXA Sprain of ligaments of thoracic spine, initial encounter: Secondary | ICD-10-CM | POA: Diagnosis not present

## 2020-07-15 DIAGNOSIS — M9903 Segmental and somatic dysfunction of lumbar region: Secondary | ICD-10-CM | POA: Diagnosis not present

## 2020-07-15 DIAGNOSIS — M9902 Segmental and somatic dysfunction of thoracic region: Secondary | ICD-10-CM | POA: Diagnosis not present

## 2020-07-15 DIAGNOSIS — M9901 Segmental and somatic dysfunction of cervical region: Secondary | ICD-10-CM | POA: Diagnosis not present

## 2020-07-15 DIAGNOSIS — S338XXA Sprain of other parts of lumbar spine and pelvis, initial encounter: Secondary | ICD-10-CM | POA: Diagnosis not present

## 2020-07-15 DIAGNOSIS — S134XXA Sprain of ligaments of cervical spine, initial encounter: Secondary | ICD-10-CM | POA: Diagnosis not present

## 2020-07-15 DIAGNOSIS — S233XXA Sprain of ligaments of thoracic spine, initial encounter: Secondary | ICD-10-CM | POA: Diagnosis not present

## 2020-07-16 ENCOUNTER — Encounter: Payer: Self-pay | Admitting: *Deleted

## 2020-07-22 DIAGNOSIS — M9903 Segmental and somatic dysfunction of lumbar region: Secondary | ICD-10-CM | POA: Diagnosis not present

## 2020-07-22 DIAGNOSIS — S338XXA Sprain of other parts of lumbar spine and pelvis, initial encounter: Secondary | ICD-10-CM | POA: Diagnosis not present

## 2020-07-22 DIAGNOSIS — M9902 Segmental and somatic dysfunction of thoracic region: Secondary | ICD-10-CM | POA: Diagnosis not present

## 2020-07-22 DIAGNOSIS — S134XXA Sprain of ligaments of cervical spine, initial encounter: Secondary | ICD-10-CM | POA: Diagnosis not present

## 2020-07-22 DIAGNOSIS — S233XXA Sprain of ligaments of thoracic spine, initial encounter: Secondary | ICD-10-CM | POA: Diagnosis not present

## 2020-07-22 DIAGNOSIS — M9901 Segmental and somatic dysfunction of cervical region: Secondary | ICD-10-CM | POA: Diagnosis not present

## 2020-07-29 DIAGNOSIS — M9901 Segmental and somatic dysfunction of cervical region: Secondary | ICD-10-CM | POA: Diagnosis not present

## 2020-07-29 DIAGNOSIS — S338XXA Sprain of other parts of lumbar spine and pelvis, initial encounter: Secondary | ICD-10-CM | POA: Diagnosis not present

## 2020-07-29 DIAGNOSIS — M9903 Segmental and somatic dysfunction of lumbar region: Secondary | ICD-10-CM | POA: Diagnosis not present

## 2020-07-29 DIAGNOSIS — S134XXA Sprain of ligaments of cervical spine, initial encounter: Secondary | ICD-10-CM | POA: Diagnosis not present

## 2020-07-29 DIAGNOSIS — M9902 Segmental and somatic dysfunction of thoracic region: Secondary | ICD-10-CM | POA: Diagnosis not present

## 2020-07-29 DIAGNOSIS — S233XXA Sprain of ligaments of thoracic spine, initial encounter: Secondary | ICD-10-CM | POA: Diagnosis not present

## 2020-08-05 DIAGNOSIS — M9901 Segmental and somatic dysfunction of cervical region: Secondary | ICD-10-CM | POA: Diagnosis not present

## 2020-08-05 DIAGNOSIS — S233XXA Sprain of ligaments of thoracic spine, initial encounter: Secondary | ICD-10-CM | POA: Diagnosis not present

## 2020-08-05 DIAGNOSIS — M9903 Segmental and somatic dysfunction of lumbar region: Secondary | ICD-10-CM | POA: Diagnosis not present

## 2020-08-05 DIAGNOSIS — S134XXA Sprain of ligaments of cervical spine, initial encounter: Secondary | ICD-10-CM | POA: Diagnosis not present

## 2020-08-05 DIAGNOSIS — S338XXA Sprain of other parts of lumbar spine and pelvis, initial encounter: Secondary | ICD-10-CM | POA: Diagnosis not present

## 2020-08-05 DIAGNOSIS — M9902 Segmental and somatic dysfunction of thoracic region: Secondary | ICD-10-CM | POA: Diagnosis not present

## 2020-08-12 ENCOUNTER — Other Ambulatory Visit: Payer: Self-pay

## 2020-08-12 ENCOUNTER — Encounter: Payer: Self-pay | Admitting: Internal Medicine

## 2020-08-12 ENCOUNTER — Ambulatory Visit: Payer: PPO | Admitting: Internal Medicine

## 2020-08-12 VITALS — BP 115/67 | HR 71 | Temp 97.3°F | Ht 70.0 in | Wt 198.2 lb

## 2020-08-12 DIAGNOSIS — Z86010 Personal history of colonic polyps: Secondary | ICD-10-CM | POA: Diagnosis not present

## 2020-08-12 MED ORDER — PEG 3350-KCL-NA BICARB-NACL 420 G PO SOLR: 4000.0000 mL | ORAL | 0 refills | Status: DC

## 2020-08-12 NOTE — Patient Instructions (Signed)
We will schedule a colonoscopy (history of colonic polyp).  ASA 3/conscious sedation  Further recommendations to follow after colonoscopy has been performed.

## 2020-08-12 NOTE — Progress Notes (Signed)
Primary Care Physician:  [name], MD Primary Gastroenterologist:  Dr. Gala Romney  Pre-Procedure History & Physical: HPI:  Chase Guerrero is a 75 y.o. male here for for consideration of a surveillance colonoscopy.  Patient underwent a colonoscopy back in 2017 for colorectal cancer screening purposes.  He was found of a small adenoma on the right side.  He was slated for 5-year follow-up.  He states he is occasionally constipated.  He occasionally passes a small amount of fresh blood when he has a hard stool.  He has no associated abdominal pain or any upper GI tract symptoms.  History of CAD-history of MI/status post CABG.  History of atrial fibrillation.  He is not anticoagulated. Past Medical History:  Diagnosis Date   Arthritis    Atrial fibrillation ([place])    Cancer ([facility])    skin   Coronary artery disease    Enlarged prostate    GERD (gastroesophageal reflux disease)    Hypertension    Myocardial infarction ([facility])    mild   Pleural effusion    Sleep apnea    not wearing  cpap  not worn in 9-10 yrs    Past Surgical History:  Procedure Laterality Date   CHEST TUBE INSERTION Left 08/16/2017   Procedure: INSERTION PLEURAL DRAINAGE CATHETER;  Surgeon: Ivin Poot, MD;  Location: [place];  Service: Thoracic;  Laterality: Left;   COLONOSCOPY N/A 07/23/2015   Procedure: COLONOSCOPY;  Surgeon: Daneil Dolin, MD;  [facility];  Service: Endoscopy;  Laterality: N/A;  10:30 Am   CORONARY ARTERY BYPASS GRAFT N/A 06/27/2017   Procedure: CORONARY ARTERY BYPASS GRAFTING (CABG) x 2  WITH ENDOSCOPIC HARVESTING OF RIGHT SAPHENOUS VEIN;  Surgeon: Ivin Poot, MD;  Location: [facility];  Service: Open Heart Surgery;  Laterality: N/A;   IR THORACENTESIS ASP PLEURAL SPACE W/IMG GUIDE  08/08/2017   LEFT HEART CATH AND CORONARY ANGIOGRAPHY N/A 06/14/2017   Procedure: LEFT HEART CATH AND CORONARY ANGIOGRAPHY;  Surgeon: Leonie Man, MD;  Location: [place] [facility];  Service: Cardiovascular;   Laterality: N/A;   REMOVAL OF PLEURAL DRAINAGE CATHETER Left 10/13/2017   Procedure: REMOVAL OF PLEURAL DRAINAGE CATHETER;  Surgeon: Ivin Poot, MD;  [facility];  Service: Thoracic;  Laterality: Left;   Right hand surgery     Right knee arthroscopy     TEE WITHOUT CARDIOVERSION N/A 06/27/2017   Procedure: TRANSESOPHAGEAL ECHOCARDIOGRAM (TEE);  Surgeon: Prescott Gum, Collier Salina, MD;  Location: [place];  Service: Open Heart Surgery;  Laterality: N/A;    Prior to Admission medications   Medication Sig Start Date End Date Taking? Authorizing Provider  acetaminophen (TYLENOL) 500 MG tablet Take 2 tablets (1,000 mg total) by mouth every 6 (six) hours as needed for mild pain or fever. 07/02/17  Yes Barrett, Lodema Hong, PA-C  aspirin EC 81 MG tablet Take 81 mg by mouth daily.   Yes [provider]  finasteride (PROSCAR) 5 MG tablet Take 5 mg by mouth daily.   Yes [provider]  fluticasone (FLONASE) 50 MCG/ACT nasal spray Place 1 spray into both nostrils daily as needed for allergies or rhinitis.   Yes [provider]  lisinopril (ZESTRIL) 2.5 MG tablet Take 2.5 mg by mouth daily.   Yes [provider]  Menthol, Topical Analgesic, (BIOFREEZE EX) Apply 1 application topically daily as needed (pain).   Yes [provider]  nitroGLYCERIN (NITROSTAT) 0.4 MG SL tablet Place 0.4 mg  under the tongue every 5 (five) minutes as needed for chest pain. 05/31/17  Yes [provider]  potassium chloride SA (KLOR-CON) 20 MEQ tablet Take 20 mEq by mouth daily.   Yes [provider]  rosuvastatin (CRESTOR) 40 MG tablet Take 40 mg by mouth daily.   Yes [provider]  Soft Lens Products (SENSITIVE EYES SALINE) SOLN Place 1 drop into both eyes 3 (three) times daily.   Yes [provider]    Allergies as of 08/12/2020   (No Known Allergies)    Family History  Problem Relation Age of Onset   Alzheimer's disease Mother    Arthritis Sister     Heart attack Brother    Hypercholesterolemia Sister    Liver disease Sister     Social History   Socioeconomic History   Marital status: Married    Spouse name: Not on file   Number of children: Not on file   Years of education: Not on file   Highest education level: Not on file  Occupational History   Occupation: Retired  Tobacco Use   Smoking status: Former    Packs/day: 0.50    Years: 30.00    Pack years: 15.00    Types: Cigarettes    Quit date: 02/16/1988    Years since quitting: 32.5   Smokeless tobacco: Former    Types: Snuff   Tobacco comments:    USING NICORETTE GUM TO [place]  Vaping Use   Vaping Use: Never used  Substance and Sexual Activity   Alcohol use: No   Drug use: No   Sexual activity: Not on file  Other Topics Concern   Not on file  Social History Narrative   Not on file   Social Determinants of Health   Financial Resource Strain: Not on file  Food Insecurity: Not on file  Transportation Needs: Not on file  Physical Activity: Not on file  Stress: Not on file  Social Connections: Not on file  Intimate Partner Violence: Not on file    Review of Systems: See HPI, otherwise negative ROS  Physical Exam: BP 115/67   Pulse 71   Temp (!) 97.3 F (36.3 C) (Temporal)   Ht 5\' 10"  (1.778 m)   Wt 198 lb 3.2 oz (89.9 kg)   BMI 28.44 kg/m  General:   Alert,  pleasant and cooperative in NAD Neck:  Supple; no masses or thyromegaly. No significant cervical adenopathy. Lungs:  Clear throughout to auscultation.   No wheezes, crackles, or rhonchi. No acute distress. Heart:  Regular rate and rhythm; no murmurs, clicks, rubs,  or gallops. Abdomen: Non-distended, normal bowel sounds.  Soft and nontender without appreciable mass or hepatosplenomegaly.   Impression/Plan: Pleasant 75 year old gentleman with a history of colonic adenoma removed from his hepatic flexure 2017.  He is here to discuss proceeding with a surveillance  colonoscopy.  He does have paper hematochezia which is likely due to a benign ano-rectal source.  We had a fairly lengthy discussion regarding the pros and cons of 1 more colonoscopy given his current age.  Patient's questions have been answered.  He wishes to proceed with 1 more colonoscopy for surveillance purposes.  Recommendations:  I have offered the patient a surveillance colonoscopy in the near future.  ASA 3/conscious sedation. The risks, benefits, limitations, alternatives and imponderables have been reviewed with the patient. Questions have been answered. All parties are agreeable.    Further recommendations to follow after colonoscopy  has been performed.  Notice: This dictation was prepared with Dragon dictation along with smaller phrase technology. Any transcriptional errors that result from this process are unintentional and may not be corrected upon review.

## 2020-08-12 NOTE — H&P (View-Only) (Signed)
Primary Care Physician:  [name], MD Primary Gastroenterologist:  Dr. Gala Romney  Pre-Procedure History & Physical: HPI:  Chase Guerrero is a 75 y.o. male here for for consideration of a surveillance colonoscopy.  Patient underwent a colonoscopy back in 2017 for colorectal cancer screening purposes.  He was found of a small adenoma on the right side.  He was slated for 5-year follow-up.  He states he is occasionally constipated.  He occasionally passes a small amount of fresh blood when he has a hard stool.  He has no associated abdominal pain or any upper GI tract symptoms.  History of CAD-history of MI/status post CABG.  History of atrial fibrillation.  He is not anticoagulated. Past Medical History:  Diagnosis Date   Arthritis    Atrial fibrillation ([place])    Cancer (H[facility])    skin   Coronary artery disease    Enlarged prostate    GERD (gastroesophageal reflux disease)    Hypertension    Myocardial infarction ([facility])    mild   Pleural effusion    Sleep apnea    not wearing  cpap  not worn in 9-10 yrs    Past Surgical History:  Procedure Laterality Date   CHEST TUBE INSERTION Left 08/16/2017   Procedure: INSERTION PLEURAL DRAINAGE CATHETER;  Surgeon: Ivin Poot, MD;  Location: [place];  Service: Thoracic;  Laterality: Left;   COLONOSCOPY N/A 07/23/2015   Procedure: COLONOSCOPY;  Surgeon: Daneil Dolin, MD;  [facility];  Service: Endoscopy;  Laterality: N/A;  10:30 Am   CORONARY ARTERY BYPASS GRAFT N/A 06/27/2017   Procedure: CORONARY ARTERY BYPASS GRAFTING (CABG) x 2  WITH ENDOSCOPIC HARVESTING OF RIGHT SAPHENOUS VEIN;  Surgeon: Ivin Poot, MD;  Location: [place];  Service: Open Heart Surgery;  Laterality: N/A;   IR THORACENTESIS ASP PLEURAL SPACE W/IMG GUIDE  08/08/2017   LEFT HEART CATH AND CORONARY ANGIOGRAPHY N/A 06/14/2017   Procedure: LEFT HEART CATH AND CORONARY ANGIOGRAPHY;  Surgeon: Leonie Man, MD;  Location: [place] [facility];  Service: Cardiovascular;   Laterality: N/A;   REMOVAL OF PLEURAL DRAINAGE CATHETER Left 10/13/2017   Procedure: REMOVAL OF PLEURAL DRAINAGE CATHETER;  Surgeon: Ivin Poot, MD;  Location: [facility];  Service: Thoracic;  Laterality: Left;   Right hand surgery     Right knee arthroscopy     TEE WITHOUT CARDIOVERSION N/A 06/27/2017   Procedure: TRANSESOPHAGEAL ECHOCARDIOGRAM (TEE);  Surgeon: Prescott Gum, Collier Salina, MD;  Location: [place];  Service: Open Heart Surgery;  Laterality: N/A;    Prior to Admission medications   Medication Sig Start Date End Date Taking? Authorizing Provider  acetaminophen (TYLENOL) 500 MG tablet Take 2 tablets (1,000 mg total) by mouth every 6 (six) hours as needed for mild pain or fever. 07/02/17  Yes Barrett, Lodema Hong, PA-C  aspirin EC 81 MG tablet Take 81 mg by mouth daily.   Yes [provider]  finasteride (PROSCAR) 5 MG tablet Take 5 mg by mouth daily.   Yes [provider]  fluticasone (FLONASE) 50 MCG/ACT nasal spray Place 1 spray into both nostrils daily as needed for allergies or rhinitis.   Yes [provider]  lisinopril (ZESTRIL) 2.5 MG tablet Take 2.5 mg by mouth daily.   Yes [provider]  Menthol, Topical Analgesic, (BIOFREEZE EX) Apply 1 application topically daily as needed (pain).   Yes [provider]  nitroGLYCERIN (NITROSTAT) 0.4 MG SL tablet Place 0.4 mg  under the tongue every 5 (five) minutes as needed for chest pain. 05/31/17  Yes [provider]  potassium chloride SA (KLOR-CON) 20 MEQ tablet Take 20 mEq by mouth daily.   Yes [provider]  rosuvastatin (CRESTOR) 40 MG tablet Take 40 mg by mouth daily.   Yes [provider]  Soft Lens Products (SENSITIVE EYES SALINE) SOLN Place 1 drop into both eyes 3 (three) times daily.   Yes [provider]    Allergies as of 08/12/2020   (No Known Allergies)    Family History  Problem Relation Age of Onset   Alzheimer's disease Mother    Arthritis Sister     Heart attack Brother    Hypercholesterolemia Sister    Liver disease Sister     Social History   Socioeconomic History   Marital status: Married    Spouse name: Not on file   Number of children: Not on file   Years of education: Not on file   Highest education level: Not on file  Occupational History   Occupation: Retired  Tobacco Use   Smoking status: Former    Packs/day: 0.50    Years: 30.00    Pack years: 15.00    Types: Cigarettes    Quit date: 02/16/1988    Years since quitting: 32.5   Smokeless tobacco: Former    Types: Snuff   Tobacco comments:    USING NICORETTE GUM TO [place]  Vaping Use   Vaping Use: Never used  Substance and Sexual Activity   Alcohol use: No   Drug use: No   Sexual activity: Not on file  Other Topics Concern   Not on file  Social History Narrative   Not on file   Social Determinants of Health   Financial Resource Strain: Not on file  Food Insecurity: Not on file  Transportation Needs: Not on file  Physical Activity: Not on file  Stress: Not on file  Social Connections: Not on file  Intimate Partner Violence: Not on file    Review of Systems: See HPI, otherwise negative ROS  Physical Exam: BP 115/67   Pulse 71   Temp (!) 97.3 F (36.3 C) (Temporal)   Ht 5\' 10"  (1.778 m)   Wt 198 lb 3.2 oz (89.9 kg)   BMI 28.44 kg/m  General:   Alert,  pleasant and cooperative in NAD Neck:  Supple; no masses or thyromegaly. No significant cervical adenopathy. Lungs:  Clear throughout to auscultation.   No wheezes, crackles, or rhonchi. No acute distress. Heart:  Regular rate and rhythm; no murmurs, clicks, rubs,  or gallops. Abdomen: Non-distended, normal bowel sounds.  Soft and nontender without appreciable mass or hepatosplenomegaly.   Impression/Plan: Pleasant 75 year old gentleman with a history of colonic adenoma removed from his hepatic flexure 2017.  He is here to discuss proceeding with a surveillance  colonoscopy.  He does have paper hematochezia which is likely due to a benign ano-rectal source.  We had a fairly lengthy discussion regarding the pros and cons of 1 more colonoscopy given his current age.  Patient's questions have been answered.  He wishes to proceed with 1 more colonoscopy for surveillance purposes.  Recommendations:  I have offered the patient a surveillance colonoscopy in the near future.  ASA 3/conscious sedation. The risks, benefits, limitations, alternatives and imponderables have been reviewed with the patient. Questions have been answered. All parties are agreeable.    Further recommendations to follow after colonoscopy  has been performed.  Notice: This dictation was prepared with Dragon dictation along with smaller phrase technology. Any transcriptional errors that result from this process are unintentional and may not be corrected upon review.

## 2020-08-13 DIAGNOSIS — S233XXA Sprain of ligaments of thoracic spine, initial encounter: Secondary | ICD-10-CM | POA: Diagnosis not present

## 2020-08-13 DIAGNOSIS — S338XXA Sprain of other parts of lumbar spine and pelvis, initial encounter: Secondary | ICD-10-CM | POA: Diagnosis not present

## 2020-08-13 DIAGNOSIS — M9901 Segmental and somatic dysfunction of cervical region: Secondary | ICD-10-CM | POA: Diagnosis not present

## 2020-08-13 DIAGNOSIS — M9902 Segmental and somatic dysfunction of thoracic region: Secondary | ICD-10-CM | POA: Diagnosis not present

## 2020-08-13 DIAGNOSIS — M9903 Segmental and somatic dysfunction of lumbar region: Secondary | ICD-10-CM | POA: Diagnosis not present

## 2020-08-13 DIAGNOSIS — S134XXA Sprain of ligaments of cervical spine, initial encounter: Secondary | ICD-10-CM | POA: Diagnosis not present

## 2020-08-27 ENCOUNTER — Ambulatory Visit (HOSPITAL_COMMUNITY)
Admission: RE | Admit: 2020-08-27 | Discharge: 2020-08-27 | Disposition: A | Discharge status: Home / Self Care | Payer: PPO | Attending: Internal Medicine | Admitting: Internal Medicine

## 2020-08-27 ENCOUNTER — Encounter (HOSPITAL_COMMUNITY): Payer: Self-pay | Admitting: Internal Medicine

## 2020-08-27 ENCOUNTER — Encounter (HOSPITAL_COMMUNITY)
Admission: RE | Disposition: A | Discharge status: Home / Self Care | Payer: Self-pay | Source: Home / Self Care | Attending: Internal Medicine

## 2020-08-27 ENCOUNTER — Other Ambulatory Visit: Payer: Self-pay

## 2020-08-27 DIAGNOSIS — I2510 Atherosclerotic heart disease of native coronary artery without angina pectoris: Secondary | ICD-10-CM | POA: Insufficient documentation

## 2020-08-27 DIAGNOSIS — Z7982 Long term (current) use of aspirin: Secondary | ICD-10-CM | POA: Diagnosis not present

## 2020-08-27 DIAGNOSIS — Z1211 Encounter for screening for malignant neoplasm of colon: Secondary | ICD-10-CM | POA: Insufficient documentation

## 2020-08-27 DIAGNOSIS — Z951 Presence of aortocoronary bypass graft: Secondary | ICD-10-CM | POA: Diagnosis not present

## 2020-08-27 DIAGNOSIS — K5730 Diverticulosis of large intestine without perforation or abscess without bleeding: Secondary | ICD-10-CM | POA: Diagnosis not present

## 2020-08-27 DIAGNOSIS — Z79899 Other long term (current) drug therapy: Secondary | ICD-10-CM | POA: Diagnosis not present

## 2020-08-27 DIAGNOSIS — I4891 Unspecified atrial fibrillation: Secondary | ICD-10-CM | POA: Diagnosis not present

## 2020-08-27 DIAGNOSIS — I252 Old myocardial infarction: Secondary | ICD-10-CM | POA: Insufficient documentation

## 2020-08-27 DIAGNOSIS — Z87891 Personal history of nicotine dependence: Secondary | ICD-10-CM | POA: Diagnosis not present

## 2020-08-27 DIAGNOSIS — Z86010 Personal history of colonic polyps: Secondary | ICD-10-CM | POA: Diagnosis not present

## 2020-08-27 DIAGNOSIS — D123 Benign neoplasm of transverse colon: Secondary | ICD-10-CM | POA: Insufficient documentation

## 2020-08-27 DIAGNOSIS — K635 Polyp of colon: Secondary | ICD-10-CM | POA: Diagnosis not present

## 2020-08-27 HISTORY — PX: POLYPECTOMY: SHX5525

## 2020-08-27 HISTORY — PX: COLONOSCOPY: SHX5424

## 2020-08-27 SURGERY — COLONOSCOPY
Anesthesia: Moderate Sedation

## 2020-08-27 MED ORDER — ONDANSETRON HCL 4 MG/2ML IJ SOLN
INTRAMUSCULAR | Status: AC
  Filled 2020-08-27: qty 2

## 2020-08-27 MED ORDER — MIDAZOLAM HCL 5 MG/5ML IJ SOLN
INTRAMUSCULAR | Status: DC | PRN
  Administered 2020-08-27 (×2): 2 mg via INTRAVENOUS

## 2020-08-27 MED ORDER — MEPERIDINE HCL 50 MG/ML IJ SOLN
INTRAMUSCULAR | Status: AC
  Filled 2020-08-27: qty 1

## 2020-08-27 MED ORDER — MEPERIDINE HCL 100 MG/ML IJ SOLN
INTRAMUSCULAR | Status: DC | PRN
  Administered 2020-08-27: 25 mg via INTRAVENOUS
  Administered 2020-08-27: 15 mg via INTRAVENOUS

## 2020-08-27 MED ORDER — SODIUM CHLORIDE 0.9 % IV SOLN: INTRAVENOUS | Status: DC

## 2020-08-27 MED ORDER — MIDAZOLAM HCL 5 MG/5ML IJ SOLN
INTRAMUSCULAR | Status: AC
  Filled 2020-08-27: qty 10

## 2020-08-27 NOTE — Discharge Instructions (Addendum)
  Colonoscopy Discharge Instructions  Read the instructions outlined below and refer to this sheet in the next few weeks. These discharge instructions provide you with general information on caring for yourself after you leave the hospital. Your doctor may also give you specific instructions. While your treatment has been planned according to the most current medical practices available, unavoidable complications occasionally occur. If you have any problems or questions after discharge, call Dr. Gala Romney at [phone]. ACTIVITY You may resume your regular activity, but move at a slower pace for the next 24 hours.  Take frequent rest periods for the next 24 hours.  Walking will help get rid of the air and reduce the bloated feeling in your belly (abdomen).  No driving for 24 hours (because of the medicine (anesthesia) used during the test).   Do not sign any important legal documents or operate any machinery for 24 hours (because of the anesthesia used during the test).  NUTRITION Drink plenty of fluids.  You may resume your normal diet as instructed by your doctor.  Begin with a light meal and progress to your normal diet. Heavy or fried foods are harder to digest and may make you feel sick to your stomach (nauseated).  Avoid alcoholic beverages for 24 hours or as instructed.  MEDICATIONS You may resume your normal medications unless your doctor tells you otherwise.  WHAT YOU CAN EXPECT TODAY Some feelings of bloating in the abdomen.  Passage of more gas than usual.  Spotting of blood in your stool or on the toilet paper.  IF YOU HAD POLYPS REMOVED DURING THE COLONOSCOPY: No aspirin products for 7 days or as instructed.  No alcohol for 7 days or as instructed.  Eat a soft diet for the next 24 hours.  FINDING OUT THE RESULTS OF YOUR TEST Not all test results are available during your visit. If your test results are not back during the visit, make an appointment with your caregiver to find out the  results. Do not assume everything is normal if you have not heard from your caregiver or the medical facility. It is important for you to follow up on all of your test results.  SEEK IMMEDIATE MEDICAL ATTENTION IF: You have more than a spotting of blood in your stool.  Your belly is swollen (abdominal distention).  You are nauseated or vomiting.  You have a temperature over 101.  You have abdominal pain or discomfort that is severe or gets worse throughout the day.    3 small polyps removed today  Polyp and diverticulosis information provided  Further recommendations to follow pending review of pathology report  Patient request, I called Hoyle Sauer at [phone] -got voicemail left a message.

## 2020-08-27 NOTE — Op Note (Signed)
[facility] Patient Name: Chase Guerrero Procedure Date: 08/27/2020 10:16 AM MRN: [id number] Date of Birth: Oct 15, 1945 Attending MD: Norvel Richards , MD CSN: [id number] Age: 75 Admit Type: Outpatient Procedure:                Colonoscopy Indications:              High risk colon cancer surveillance: Personal                            history of colonic polyps Providers:                Norvel Richards, MD, Lurline Del, RN, Raphael Gibney, Technician Referring MD:              Medicines:                Midazolam 4 mg IV, Meperidine 40 mg IV Complications:            No immediate complications. Estimated Blood Loss:     Estimated blood loss was minimal. Procedure:                Pre-Anesthesia Assessment:                           - Prior to the procedure, a History and Physical                            was performed, and patient medications and                            allergies were reviewed. The patient's tolerance of                            previous anesthesia was also reviewed. The risks                            and benefits of the procedure and the sedation                            options and risks were discussed with the patient.                            All questions were answered, and informed consent                            was obtained. Prior Anticoagulants: The patient has                            taken no previous anticoagulant or antiplatelet                            agents. ASA Grade Assessment: III - A patient with  severe systemic disease. After reviewing the risks                            and benefits, the patient was deemed in                            satisfactory condition to undergo the procedure.                           After obtaining informed consent, the colonoscope                            was passed under direct vision. Throughout the                             procedure, the patient's blood pressure, pulse, and                            oxygen saturations were monitored continuously. The                            CF-HQ190L ([id number]) scope was introduced through                            the anus and advanced to the the cecum, identified                            by appendiceal orifice and ileocecal valve. The                            colonoscopy was performed without difficulty. The                            patient tolerated the procedure well. The quality                            of the bowel preparation was adequate. Scope In: 10:38:52 AM Scope Out: 10:53:03 AM Scope Withdrawal Time: 0 hours 10 minutes 5 seconds  Total Procedure Duration: 0 hours 14 minutes 11 seconds  Findings:      The perianal and digital rectal examinations were normal.      Three sessile polyps were found in the descending colon and cecum. The       polyps were 3 to 4 mm in size. These polyps were removed with a cold       snare. Resection and retrieval were complete. Estimated blood loss was       minimal.      Scattered medium-mouthed diverticula were found in the sigmoid colon and       descending colon.      The exam was otherwise without abnormality on direct and retroflexion       views. Impression:               - Three 3 to 4 mm polyps in the descending colon  and in the cecum, removed with a cold snare.                            Resected and retrieved.                           - Diverticulosis in the sigmoid colon and in the                            descending colon.                           - The examination was otherwise normal on direct                            and retroflexion views. Moderate Sedation:      Moderate (conscious) sedation was administered by the endoscopy nurse       and supervised by the endoscopist. The following parameters were       monitored: oxygen saturation, heart rate, blood pressure,  respiratory       rate, EKG, adequacy of pulmonary ventilation, and response to care.       Total physician intraservice time was 20 minutes. Recommendation:           - Patient has a contact number available for                            emergencies. The signs and symptoms of potential                            delayed complications were discussed with the                            patient. Return to normal activities tomorrow.                            Written discharge instructions were provided to the                            patient.                           - Advance diet as tolerated.                           - Continue present medications.                           - Repeat colonoscopy date to be determined after                            pending pathology results are reviewed for                            surveillance.                           -  Return to GI office (date not yet determined). Procedure Code(s):        --- Professional ---                           [phone], Colonoscopy, flexible; with removal of                            tumor(s), polyp(s), or other lesion(s) by snare                            technique                           G0500, Moderate sedation services provided by the                            same physician or other qualified health care                            professional performing a gastrointestinal                            endoscopic service that sedation supports,                            requiring the presence of an independent trained                            observer to assist in the monitoring of the                            patient's level of consciousness and physiological                            status; initial 15 minutes of intra-service time;                            patient age 102 years or older (additional time may                            be reported with [phone], as appropriate) Diagnosis Code(s):        ---  Professional ---                           Z86.010, Personal history of colonic polyps                           K63.5, Polyp of colon                           K57.30, Diverticulosis of large intestine without                            perforation or abscess without bleeding CPT copyright 2019 [employer]. All rights reserved. The codes documented  in this report are preliminary and upon coder review may  be revised to meet current compliance requirements. Cristopher Estimable. Majel Giel, MD Norvel Richards, MD 08/27/2020 11:04:25 AM This report has been signed electronically. Number of Addenda: 0

## 2020-08-27 NOTE — Interval H&P Note (Signed)
History and Physical Interval Note:  08/27/2020 10:20 AM  Chase Guerrero  has presented today for surgery, with the diagnosis of history of colonic polyps.  The various methods of treatment have been discussed with the patient and family. After consideration of risks, benefits and other options for treatment, the patient has consented to  Procedure(s) with comments: COLONOSCOPY (N/A) - 10:30am as a surgical intervention.  The patient's history has been reviewed, patient examined, no change in status, stable for surgery.  I have reviewed the patient's chart and labs.  Questions were answered to the patient's satisfaction.     Jeremaine Maraj  No change.  Diagnostic colonoscopy per plan. The risks, benefits, limitations, alternatives and imponderables have been reviewed with the patient. Questions have been answered. All parties are agreeable.

## 2020-08-28 DIAGNOSIS — S338XXA Sprain of other parts of lumbar spine and pelvis, initial encounter: Secondary | ICD-10-CM | POA: Diagnosis not present

## 2020-08-28 DIAGNOSIS — M9901 Segmental and somatic dysfunction of cervical region: Secondary | ICD-10-CM | POA: Diagnosis not present

## 2020-08-28 DIAGNOSIS — S134XXA Sprain of ligaments of cervical spine, initial encounter: Secondary | ICD-10-CM | POA: Diagnosis not present

## 2020-08-28 DIAGNOSIS — M9902 Segmental and somatic dysfunction of thoracic region: Secondary | ICD-10-CM | POA: Diagnosis not present

## 2020-08-28 DIAGNOSIS — M9903 Segmental and somatic dysfunction of lumbar region: Secondary | ICD-10-CM | POA: Diagnosis not present

## 2020-08-28 DIAGNOSIS — S233XXA Sprain of ligaments of thoracic spine, initial encounter: Secondary | ICD-10-CM | POA: Diagnosis not present

## 2020-08-28 LAB — SURGICAL PATHOLOGY

## 2020-09-02 ENCOUNTER — Encounter (HOSPITAL_COMMUNITY): Payer: Self-pay | Admitting: Internal Medicine

## 2020-09-04 ENCOUNTER — Encounter: Payer: Self-pay | Admitting: Internal Medicine

## 2020-09-12 DIAGNOSIS — M9902 Segmental and somatic dysfunction of thoracic region: Secondary | ICD-10-CM | POA: Diagnosis not present

## 2020-09-12 DIAGNOSIS — M9903 Segmental and somatic dysfunction of lumbar region: Secondary | ICD-10-CM | POA: Diagnosis not present

## 2020-09-12 DIAGNOSIS — S134XXA Sprain of ligaments of cervical spine, initial encounter: Secondary | ICD-10-CM | POA: Diagnosis not present

## 2020-09-12 DIAGNOSIS — S233XXA Sprain of ligaments of thoracic spine, initial encounter: Secondary | ICD-10-CM | POA: Diagnosis not present

## 2020-09-12 DIAGNOSIS — S338XXA Sprain of other parts of lumbar spine and pelvis, initial encounter: Secondary | ICD-10-CM | POA: Diagnosis not present

## 2020-09-12 DIAGNOSIS — M9901 Segmental and somatic dysfunction of cervical region: Secondary | ICD-10-CM | POA: Diagnosis not present

## 2020-09-20 ENCOUNTER — Other Ambulatory Visit: Payer: Self-pay | Admitting: Cardiology

## 2020-09-23 DIAGNOSIS — E785 Hyperlipidemia, unspecified: Secondary | ICD-10-CM | POA: Diagnosis not present

## 2020-09-23 DIAGNOSIS — I2510 Atherosclerotic heart disease of native coronary artery without angina pectoris: Secondary | ICD-10-CM | POA: Diagnosis not present

## 2020-09-23 DIAGNOSIS — Z79899 Other long term (current) drug therapy: Secondary | ICD-10-CM | POA: Diagnosis not present

## 2020-09-26 DIAGNOSIS — S338XXA Sprain of other parts of lumbar spine and pelvis, initial encounter: Secondary | ICD-10-CM | POA: Diagnosis not present

## 2020-09-26 DIAGNOSIS — S134XXA Sprain of ligaments of cervical spine, initial encounter: Secondary | ICD-10-CM | POA: Diagnosis not present

## 2020-09-26 DIAGNOSIS — M9902 Segmental and somatic dysfunction of thoracic region: Secondary | ICD-10-CM | POA: Diagnosis not present

## 2020-09-26 DIAGNOSIS — S233XXA Sprain of ligaments of thoracic spine, initial encounter: Secondary | ICD-10-CM | POA: Diagnosis not present

## 2020-09-26 DIAGNOSIS — M9903 Segmental and somatic dysfunction of lumbar region: Secondary | ICD-10-CM | POA: Diagnosis not present

## 2020-09-26 DIAGNOSIS — M9901 Segmental and somatic dysfunction of cervical region: Secondary | ICD-10-CM | POA: Diagnosis not present

## 2020-09-30 DIAGNOSIS — E785 Hyperlipidemia, unspecified: Secondary | ICD-10-CM | POA: Diagnosis not present

## 2020-09-30 DIAGNOSIS — I2510 Atherosclerotic heart disease of native coronary artery without angina pectoris: Secondary | ICD-10-CM | POA: Diagnosis not present

## 2020-10-06 DIAGNOSIS — H81399 Other peripheral vertigo, unspecified ear: Secondary | ICD-10-CM | POA: Diagnosis not present

## 2020-10-08 DIAGNOSIS — D239 Other benign neoplasm of skin, unspecified: Secondary | ICD-10-CM | POA: Diagnosis not present

## 2020-10-08 DIAGNOSIS — Z85828 Personal history of other malignant neoplasm of skin: Secondary | ICD-10-CM | POA: Diagnosis not present

## 2020-10-08 DIAGNOSIS — H81399 Other peripheral vertigo, unspecified ear: Secondary | ICD-10-CM | POA: Diagnosis not present

## 2020-10-08 DIAGNOSIS — D225 Melanocytic nevi of trunk: Secondary | ICD-10-CM | POA: Diagnosis not present

## 2020-10-08 DIAGNOSIS — D485 Neoplasm of uncertain behavior of skin: Secondary | ICD-10-CM | POA: Diagnosis not present

## 2020-10-08 DIAGNOSIS — L905 Scar conditions and fibrosis of skin: Secondary | ICD-10-CM | POA: Diagnosis not present

## 2020-10-08 DIAGNOSIS — L570 Actinic keratosis: Secondary | ICD-10-CM | POA: Diagnosis not present

## 2020-10-08 DIAGNOSIS — D044 Carcinoma in situ of skin of scalp and neck: Secondary | ICD-10-CM | POA: Diagnosis not present

## 2020-10-10 DIAGNOSIS — M9903 Segmental and somatic dysfunction of lumbar region: Secondary | ICD-10-CM | POA: Diagnosis not present

## 2020-10-10 DIAGNOSIS — S134XXA Sprain of ligaments of cervical spine, initial encounter: Secondary | ICD-10-CM | POA: Diagnosis not present

## 2020-10-10 DIAGNOSIS — M9901 Segmental and somatic dysfunction of cervical region: Secondary | ICD-10-CM | POA: Diagnosis not present

## 2020-10-10 DIAGNOSIS — M9902 Segmental and somatic dysfunction of thoracic region: Secondary | ICD-10-CM | POA: Diagnosis not present

## 2020-10-10 DIAGNOSIS — S338XXA Sprain of other parts of lumbar spine and pelvis, initial encounter: Secondary | ICD-10-CM | POA: Diagnosis not present

## 2020-10-10 DIAGNOSIS — S233XXA Sprain of ligaments of thoracic spine, initial encounter: Secondary | ICD-10-CM | POA: Diagnosis not present

## 2020-10-14 DIAGNOSIS — H81399 Other peripheral vertigo, unspecified ear: Secondary | ICD-10-CM | POA: Diagnosis not present

## 2020-10-16 DIAGNOSIS — H81399 Other peripheral vertigo, unspecified ear: Secondary | ICD-10-CM | POA: Diagnosis not present

## 2020-10-20 ENCOUNTER — Other Ambulatory Visit: Payer: Self-pay | Admitting: Cardiology

## 2020-10-21 DIAGNOSIS — H81399 Other peripheral vertigo, unspecified ear: Secondary | ICD-10-CM | POA: Diagnosis not present

## 2020-10-23 ENCOUNTER — Other Ambulatory Visit: Payer: Self-pay | Admitting: Cardiology

## 2020-10-23 DIAGNOSIS — H81399 Other peripheral vertigo, unspecified ear: Secondary | ICD-10-CM | POA: Diagnosis not present

## 2020-11-03 ENCOUNTER — Ambulatory Visit: Payer: PPO | Admitting: Cardiology

## 2020-11-03 ENCOUNTER — Encounter: Payer: Self-pay | Admitting: Cardiology

## 2020-11-03 ENCOUNTER — Encounter: Payer: Self-pay | Admitting: *Deleted

## 2020-11-03 VITALS — BP 112/52 | HR 77 | Ht 70.0 in | Wt 195.0 lb

## 2020-11-03 DIAGNOSIS — E782 Mixed hyperlipidemia: Secondary | ICD-10-CM

## 2020-11-03 DIAGNOSIS — I2510 Atherosclerotic heart disease of native coronary artery without angina pectoris: Secondary | ICD-10-CM

## 2020-11-03 NOTE — Addendum Note (Signed)
Addended by: Laurine Blazer on: 11/03/2020 04:43 PM   Modules accepted: Orders

## 2020-11-03 NOTE — Patient Instructions (Signed)
Medication Instructions:  Continue all current medications.   Labwork: none  Testing/Procedures: none  Follow-Up: 6 months   Any Other Special Instructions Will Be Listed Below (If Applicable).   If you need a refill on your cardiac medications before your next appointment, please call your pharmacy.  

## 2020-11-03 NOTE — Progress Notes (Signed)
Clinical Summary Mr. Broker is a 75 y.o.male seen today for follow up of the following medical problems.    1. CAD - seen in 05/2017 for chest pain - 05/2017 high risk nuclear stress test as reported below, referred for cath - 06/2017 cath distal LM to LAD 100%, LVEF 35-45% by LV gram, referred for CABG - 06/27/17 CABG with LIMA-LAD, SVG-diag -07/2017 echo LVEF 60-65%.      - we lowred lopressor due to some orthostatic symptms. Still ongoing at last visit, was to hold for 2 weeks and monitor symptoms  - he is off metoprolol due to low bp's - has been doing some rehab exercise he reports for ongoing dizziness - home bp's 110s/60s - no recent chest pains    2. HL - labs followed by pcp - we had preivously changed lipitor to crestor, he is also on zetia.         Past Medical History:  Diagnosis Date   Arthritis    Atrial fibrillation ([place])    Cancer ([place])    skin   Coronary artery disease    Enlarged prostate    GERD (gastroesophageal reflux disease)    Hypertension    Myocardial infarction ([facility])    mild   Pleural effusion    Sleep apnea    not wearing  cpap  not worn in 9-10 yrs     No Known Allergies   Current Outpatient Medications  Medication Sig Dispense Refill   acetaminophen (TYLENOL) 500 MG tablet Take 2 tablets (1,000 mg total) by mouth every 6 (six) hours as needed for mild pain or fever. 30 tablet 0   aspirin EC 81 MG tablet Take 81 mg by mouth in the morning.     Cannabidiol POWD Apply 1 application topically 3 (three) times daily as needed (pain.). CBD cream     Carboxymethylcellulose Sodium (THERATEARS) 0.25 % SOLN Place 1-2 drops into both eyes 3 (three) times daily as needed (dry/irritation eyes).     ezetimibe (ZETIA) 10 MG tablet Take 10 mg by mouth in the morning.     finasteride (PROSCAR) 5 MG tablet Take 5 mg by mouth in the morning.     fluticasone (FLONASE) 50 MCG/ACT nasal spray Place 1 spray into both nostrils daily as needed for  allergies or rhinitis.     lisinopril (ZESTRIL) 2.5 MG tablet TAKE ONE TABLET (2.5MG  TOTAL) BY MOUTH DAILY 30 tablet 0   nitroGLYCERIN (NITROSTAT) 0.4 MG SL tablet Place 0.4 mg under the tongue every 5 (five) minutes x 3 doses as needed for chest pain.     polyethylene glycol-electrolytes (TRILYTE) 420 g solution Take 4,000 mLs by mouth as directed. 4000 mL 0   rosuvastatin (CRESTOR) 40 MG tablet TAKE ONE TABLET (40MG  TOTAL) BY MOUTH DAILY 30 tablet 0   Soft Lens Products (SENSITIVE EYES SALINE) SOLN Place 1 drop into both eyes 3 (three) times daily.     No current facility-administered medications for this visit.     Past Surgical History:  Procedure Laterality Date   CHEST TUBE INSERTION Left 08/16/2017   Procedure: INSERTION PLEURAL DRAINAGE CATHETER;  Surgeon: Ivin Poot, MD;  Location: [place];  Service: Thoracic;  Laterality: Left;   COLONOSCOPY N/A 07/23/2015   Procedure: COLONOSCOPY;  Surgeon: Daneil Dolin, MD;  [facility];  Service: Endoscopy;  Laterality: N/A;  10:30 Am   COLONOSCOPY N/A 08/27/2020   Procedure: COLONOSCOPY;  Surgeon: Daneil Dolin, MD;  [facility];  Service: Endoscopy;  Laterality: N/A;  10:30am   CORONARY ARTERY BYPASS GRAFT N/A 06/27/2017   Procedure: CORONARY ARTERY BYPASS GRAFTING (CABG) x 2  WITH ENDOSCOPIC HARVESTING OF RIGHT SAPHENOUS VEIN;  Surgeon: Ivin Poot, MD;  Location: [place];  Service: Open Heart Surgery;  Laterality: N/A;   IR THORACENTESIS ASP PLEURAL SPACE W/IMG GUIDE  08/08/2017   LEFT HEART CATH AND CORONARY ANGIOGRAPHY N/A 06/14/2017   Procedure: LEFT HEART CATH AND CORONARY ANGIOGRAPHY;  Surgeon: Leonie Man, MD;  Location: [place] [facility];  Service: Cardiovascular;  Laterality: N/A;   POLYPECTOMY  08/27/2020   Procedure: POLYPECTOMY;  Surgeon: Daneil Dolin, MD;  [facility];  Service: Endoscopy;;  cecal x2   REMOVAL OF PLEURAL DRAINAGE CATHETER Left 10/13/2017   Procedure: REMOVAL OF  PLEURAL DRAINAGE CATHETER;  Surgeon: Ivin Poot, MD;  Location[facility];  Service: Thoracic;  Laterality: Left;   Right hand surgery     Right knee arthroscopy     TEE WITHOUT CARDIOVERSION N/A 06/27/2017   Procedure: TRANSESOPHAGEAL ECHOCARDIOGRAM (TEE);  Surgeon: Prescott Gum, Collier Salina, MD;  Location: [place];  Service: Open Heart Surgery;  Laterality: N/A;     No Known Allergies    Family History  Problem Relation Age of Onset   Alzheimer's disease Mother    Arthritis Sister    Heart attack Brother    Hypercholesterolemia Sister    Liver disease Sister      Social History Mr. Rodier reports that he quit smoking about 32 years ago. His smoking use included cigarettes. He has a 15.00 pack-year smoking history. He has quit using smokeless tobacco.  His smokeless tobacco use included snuff. Mr. Wahlen reports no history of alcohol use.   Review of Systems CONSTITUTIONAL: No weight loss, fever, chills, weakness or fatigue.  HEENT: Eyes: No visual loss, blurred vision, double vision or yellow sclerae.No hearing loss, sneezing, congestion, runny nose or sore throat.  SKIN: No rash or itching.  CARDIOVASCULAR: per hpi RESPIRATORY: No shortness of breath, cough or sputum.  GASTROINTESTINAL: No anorexia, nausea, vomiting or diarrhea. No abdominal pain or blood.  GENITOURINARY: No burning on urination, no polyuria NEUROLOGICAL: No headache, dizziness, syncope, paralysis, ataxia, numbness or tingling in the extremities. No change in bowel or bladder control.  MUSCULOSKELETAL: No muscle, back pain, joint pain or stiffness.  LYMPHATICS: No enlarged nodes. No history of splenectomy.  PSYCHIATRIC: No history of depression or anxiety.  ENDOCRINOLOGIC: No reports of sweating, cold or heat intolerance. No polyuria or polydipsia.  Marland Kitchen   Physical Examination Today's Vitals   11/03/20 0939  BP: (!) 112/52  Pulse: 77  SpO2: 100%  Weight: 195 lb (88.5 kg)  Height: 5\' 10"  (1.778 m)   Body mass  index is 27.98 kg/m.  Gen: resting comfortably, no acute distress HEENT: no scleral icterus, pupils equal round and reactive, no palptable cervical adenopathy,  CV: RRR, no m/r,g no jvd Resp: Clear to auscultation bilaterally GI: abdomen is soft, non-tender, non-distended, normal bowel sounds, no hepatosplenomegaly MSK: extremities are warm, no edema.  Skin: warm, no rash Neuro:  no focal deficits Psych: appropriate affect   Diagnostic Studies  05/2017 nuclear stress Blood pressure demonstrated a hypertensive response to exercise. 1 mm horizontal ST segment depressions in leads II, III, and aVF in recovery with nonspecific horizontal ST segment depressions in leads V5 and V6. There was excessive artifact with stress which limits interpretation. Defect 1: There is  a large defect of moderate severity present in the mid anterior, mid anteroseptal, mid inferoseptal, apical anterior, apical septal and apical inferior location. Findings consistent with a large degree of ischemia. This is a high risk study. Nuclear stress EF: 50%.     07/2017 echo Study Conclusions   - Left ventricle: The cavity size was normal. Wall thickness was   normal. Systolic function was normal. The estimated ejection   fraction was in the range of 60% to 65%. Wall motion was normal;   there were no regional wall motion abnormalities. Left   ventricular diastolic function parameters were normal. Doppler   parameters are consistent with indeterminate ventricular filling   pressure. - Aortic valve: There was mild regurgitation. - Mitral valve: There was mild regurgitation. - Right ventricle: Systolic function appeared grossly normal (RV   poorly visualized, I doubt accuracy of TAPSE). - Pericardium, extracardiac: There was a left pleural effusion.     Assessment and Plan   1. CAD - no symptosm - off beta blocker due to low bp's and dizziness - continue current meds   2. Hyperlipidemia -continue crestor  and zetia, request labs from pcp  F/u 1 year      [name], M.D.

## 2020-11-05 DIAGNOSIS — H81399 Other peripheral vertigo, unspecified ear: Secondary | ICD-10-CM | POA: Diagnosis not present

## 2020-11-13 DIAGNOSIS — L988 Other specified disorders of the skin and subcutaneous tissue: Secondary | ICD-10-CM | POA: Diagnosis not present

## 2020-11-13 DIAGNOSIS — D485 Neoplasm of uncertain behavior of skin: Secondary | ICD-10-CM | POA: Diagnosis not present

## 2020-11-27 DIAGNOSIS — C4442 Squamous cell carcinoma of skin of scalp and neck: Secondary | ICD-10-CM | POA: Diagnosis not present

## 2020-11-28 ENCOUNTER — Other Ambulatory Visit: Payer: Self-pay | Admitting: Cardiology

## 2021-01-28 DIAGNOSIS — S134XXA Sprain of ligaments of cervical spine, initial encounter: Secondary | ICD-10-CM | POA: Diagnosis not present

## 2021-01-28 DIAGNOSIS — S233XXA Sprain of ligaments of thoracic spine, initial encounter: Secondary | ICD-10-CM | POA: Diagnosis not present

## 2021-01-28 DIAGNOSIS — M9903 Segmental and somatic dysfunction of lumbar region: Secondary | ICD-10-CM | POA: Diagnosis not present

## 2021-01-28 DIAGNOSIS — M9902 Segmental and somatic dysfunction of thoracic region: Secondary | ICD-10-CM | POA: Diagnosis not present

## 2021-01-28 DIAGNOSIS — S338XXA Sprain of other parts of lumbar spine and pelvis, initial encounter: Secondary | ICD-10-CM | POA: Diagnosis not present

## 2021-01-28 DIAGNOSIS — M9901 Segmental and somatic dysfunction of cervical region: Secondary | ICD-10-CM | POA: Diagnosis not present

## 2021-02-23 DIAGNOSIS — Z79899 Other long term (current) drug therapy: Secondary | ICD-10-CM | POA: Diagnosis not present

## 2021-02-23 DIAGNOSIS — E785 Hyperlipidemia, unspecified: Secondary | ICD-10-CM | POA: Diagnosis not present

## 2021-02-23 DIAGNOSIS — R7303 Prediabetes: Secondary | ICD-10-CM | POA: Diagnosis not present

## 2021-02-23 DIAGNOSIS — I2510 Atherosclerotic heart disease of native coronary artery without angina pectoris: Secondary | ICD-10-CM | POA: Diagnosis not present

## 2021-03-02 DIAGNOSIS — R7309 Other abnormal glucose: Secondary | ICD-10-CM | POA: Diagnosis not present

## 2021-03-02 DIAGNOSIS — N1831 Chronic kidney disease, stage 3a: Secondary | ICD-10-CM | POA: Diagnosis not present

## 2021-03-03 ENCOUNTER — Other Ambulatory Visit (HOSPITAL_COMMUNITY): Payer: Self-pay | Admitting: Internal Medicine

## 2021-03-03 ENCOUNTER — Other Ambulatory Visit: Payer: Self-pay | Admitting: Internal Medicine

## 2021-03-03 DIAGNOSIS — N189 Chronic kidney disease, unspecified: Secondary | ICD-10-CM

## 2021-03-12 ENCOUNTER — Other Ambulatory Visit: Payer: Self-pay

## 2021-03-12 ENCOUNTER — Ambulatory Visit (HOSPITAL_COMMUNITY)
Admission: RE | Admit: 2021-03-12 | Discharge: 2021-03-12 | Disposition: A | Discharge status: Home / Self Care | Payer: PPO | Source: Ambulatory Visit | Attending: Internal Medicine | Admitting: Internal Medicine

## 2021-03-12 DIAGNOSIS — N281 Cyst of kidney, acquired: Secondary | ICD-10-CM | POA: Diagnosis not present

## 2021-03-12 DIAGNOSIS — N189 Chronic kidney disease, unspecified: Secondary | ICD-10-CM | POA: Insufficient documentation

## 2021-03-23 ENCOUNTER — Other Ambulatory Visit: Payer: Self-pay | Admitting: Cardiology

## 2021-04-01 DIAGNOSIS — M9902 Segmental and somatic dysfunction of thoracic region: Secondary | ICD-10-CM | POA: Diagnosis not present

## 2021-04-01 DIAGNOSIS — S338XXA Sprain of other parts of lumbar spine and pelvis, initial encounter: Secondary | ICD-10-CM | POA: Diagnosis not present

## 2021-04-01 DIAGNOSIS — S233XXA Sprain of ligaments of thoracic spine, initial encounter: Secondary | ICD-10-CM | POA: Diagnosis not present

## 2021-04-01 DIAGNOSIS — S134XXA Sprain of ligaments of cervical spine, initial encounter: Secondary | ICD-10-CM | POA: Diagnosis not present

## 2021-04-01 DIAGNOSIS — M9903 Segmental and somatic dysfunction of lumbar region: Secondary | ICD-10-CM | POA: Diagnosis not present

## 2021-04-01 DIAGNOSIS — M9901 Segmental and somatic dysfunction of cervical region: Secondary | ICD-10-CM | POA: Diagnosis not present

## 2021-04-08 DIAGNOSIS — L570 Actinic keratosis: Secondary | ICD-10-CM | POA: Diagnosis not present

## 2021-05-04 ENCOUNTER — Ambulatory Visit: Payer: PPO | Admitting: Cardiology

## 2021-05-04 ENCOUNTER — Encounter: Payer: Self-pay | Admitting: Cardiology

## 2021-05-04 ENCOUNTER — Encounter: Payer: Self-pay | Admitting: *Deleted

## 2021-05-04 VITALS — BP 110/60 | HR 76 | Ht 70.0 in | Wt 193.0 lb

## 2021-05-04 DIAGNOSIS — I2510 Atherosclerotic heart disease of native coronary artery without angina pectoris: Secondary | ICD-10-CM

## 2021-05-04 DIAGNOSIS — E782 Mixed hyperlipidemia: Secondary | ICD-10-CM

## 2021-05-04 NOTE — Progress Notes (Signed)
? ? ? ?Clinical Summary ?Chase Guerrero is a 76 y.o.male seen today for follow up of the following medical problems.  ?  ?1. CAD ?- seen in 05/2017 for chest pain ?- 05/2017 high risk nuclear stress test as reported below, referred for cath ?- 06/2017 cath distal LM to LAD 100%, LVEF 35-45% by LV gram, referred for CABG ?- 06/27/17 CABG with LIMA-LAD, SVG-diag ?-07/2017 echo LVEF 60-65%.  ?  ? ?  ?- he is off metoprolol due to low bp's and orthostatic symptoms ?- has been doing some rehab exercise he reports for ongoing dizziness ? ?-no chest pains, no SOB/DOE ?- compliant with meds ? ?  ?  ?2. HL ?- labs followed by pcp ?- we had preivously changed lipitor to crestor, he is also on zetia.  ?- 09/2020 TC 127 TG 92 HDL 36 LDL 73 ? ? ? ? ? ? ?Past Medical History:  ?Diagnosis Date  ? Arthritis   ? Atrial fibrillation ([place])   ? Cancer [facility])   ? skin  ? Coronary artery disease   ? Enlarged prostate   ? GERD (gastroesophageal reflux disease)   ? Hypertension   ? Myocardial infarction [facility])   ? mild  ? Pleural effusion   ? Sleep apnea   ? not wearing  cpap  not worn in 9-10 yrs  ? ? ? ?No Known Allergies ? ? ?Current Outpatient Medications  ?Medication Sig Dispense Refill  ? acetaminophen (TYLENOL) 500 MG tablet Take 2 tablets (1,000 mg total) by mouth every 6 (six) hours as needed for mild pain or fever. 30 tablet 0  ? aspirin EC 81 MG tablet Take 81 mg by mouth in the morning.    ? Cannabidiol POWD Apply 1 application topically 3 (three) times daily as needed (pain.). CBD cream    ? Carboxymethylcellulose Sodium (THERATEARS) 0.25 % SOLN Place 1-2 drops into both eyes 3 (three) times daily as needed (dry/irritation eyes).    ? ezetimibe (ZETIA) 10 MG tablet Take 10 mg by mouth in the morning.    ? finasteride (PROSCAR) 5 MG tablet Take 5 mg by mouth in the morning.    ? fluticasone (FLONASE) 50 MCG/ACT nasal spray Place 1 spray into both nostrils daily as needed for allergies or rhinitis.    ? lisinopril (ZESTRIL) 2.5 MG tablet  TAKE ONE TABLET (2.'5MG'$  TOTAL) BY MOUTH DAILY 30 tablet 6  ? nitroGLYCERIN (NITROSTAT) 0.4 MG SL tablet Place 0.4 mg under the tongue every 5 (five) minutes x 3 doses as needed for chest pain.    ? rosuvastatin (CRESTOR) 40 MG tablet TAKE ONE TABLET ('40MG'$  TOTAL) BY MOUTH DAILY 30 tablet 11  ? Soft Lens Products (SENSITIVE EYES SALINE) SOLN Place 1 drop into both eyes 3 (three) times daily.    ? ?No current facility-administered medications for this visit.  ? ? ? ?Past Surgical History:  ?Procedure Laterality Date  ? CHEST TUBE INSERTION Left 08/16/2017  ? Procedure: INSERTION PLEURAL DRAINAGE CATHETER;  Surgeon: Ivin Poot, MD;  Location: [place];  Service: Thoracic;  Laterality: Left;  ? COLONOSCOPY N/A 07/23/2015  ? Procedure: COLONOSCOPY;  Surgeon: Daneil Dolin, MD;  [facility];  Service: Endoscopy;  Laterality: N/A;  10:30 Am  ? COLONOSCOPY N/A 08/27/2020  ? Procedure: COLONOSCOPY;  Surgeon: Daneil Dolin, MD;  [facility];  Service: Endoscopy;  Laterality: N/A;  10:30am  ? CORONARY ARTERY BYPASS GRAFT N/A 06/27/2017  ? Procedure: CORONARY ARTERY  BYPASS GRAFTING (CABG) x 2  WITH ENDOSCOPIC HARVESTING OF RIGHT SAPHENOUS VEIN;  Surgeon: Ivin Poot, MD;  Location: [place];  Service: Open Heart Surgery;  Laterality: N/A;  ? IR THORACENTESIS ASP PLEURAL SPACE W/IMG GUIDE  08/08/2017  ? LEFT HEART CATH AND CORONARY ANGIOGRAPHY N/A 06/14/2017  ? Procedure: LEFT HEART CATH AND CORONARY ANGIOGRAPHY;  Surgeon: Leonie Man, MD;  Location: [place] [facility];  Service: Cardiovascular;  Laterality: N/A;  ? POLYPECTOMY  08/27/2020  ? Procedure: POLYPECTOMY;  Surgeon: Daneil Dolin, MD;  [facility];  Service: Endoscopy;;  cecal x2  ? REMOVAL OF PLEURAL DRAINAGE CATHETER Left 10/13/2017  ? Procedure: REMOVAL OF PLEURAL DRAINAGE CATHETER;  Surgeon: Ivin Poot, MD;  Location: [place];  Service: Thoracic;  Laterality: Left;  ? Right hand surgery    ? Right knee arthroscopy    ? TEE  WITHOUT CARDIOVERSION N/A 06/27/2017  ? Procedure: TRANSESOPHAGEAL ECHOCARDIOGRAM (TEE);  Surgeon: Prescott Gum, Collier Salina, MD;  Location: [place];  Service: Open Heart Surgery;  Laterality: N/A;  ? ? ? ?No Known Allergies ? ? ? ?Family History  ?Problem Relation Age of Onset  ? Alzheimer's disease Mother   ? Arthritis Sister   ? Heart attack Brother   ? Hypercholesterolemia Sister   ? Liver disease Sister   ? ? ? ?Social History ?Chase Guerrero reports that he quit smoking about 33 years ago. His smoking use included cigarettes. He has a 15.00 pack-year smoking history. He has quit using smokeless tobacco.  His smokeless tobacco use included snuff. ?Chase Guerrero reports no history of alcohol use. ? ? ?Review of Systems ?CONSTITUTIONAL: No weight loss, fever, chills, weakness or fatigue.  ?HEENT: Eyes: No visual loss, blurred vision, double vision or yellow sclerae.No hearing loss, sneezing, congestion, runny nose or sore throat.  ?SKIN: No rash or itching.  ?CARDIOVASCULAR: per hpi ?RESPIRATORY: No shortness of breath, cough or sputum.  ?GASTROINTESTINAL: No anorexia, nausea, vomiting or diarrhea. No abdominal pain or blood.  ?GENITOURINARY: No burning on urination, no polyuria ?NEUROLOGICAL: occasional dizziness ?MUSCULOSKELETAL: No muscle, back pain, joint pain or stiffness.  ?LYMPHATICS: No enlarged nodes. No history of splenectomy.  ?PSYCHIATRIC: No history of depression or anxiety.  ?ENDOCRINOLOGIC: No reports of sweating, cold or heat intolerance. No polyuria or polydipsia.  ?. ? ? ?Physical Examination ?Today's Vitals  ? 05/04/21 0905  ?BP: 110/60  ?Pulse: 76  ?Weight: 193 lb (87.5 kg)  ?Height: '5\' 10"'$  (1.778 m)  ? ?Body mass index is 27.69 kg/m?. ? ?Gen: resting comfortably, no acute distress ?HEENT: no scleral icterus, pupils equal round and reactive, no palptable cervical adenopathy,  ?CV: RRR, no m/r/g no jvd ?Resp: Clear to auscultation bilaterally ?GI: abdomen is soft, non-tender, non-distended, normal bowel sounds,  no hepatosplenomegaly ?MSK: extremities are warm, no edema.  ?Skin: warm, no rash ?Neuro:  no focal deficits ?Psych: appropriate affect ? ? ?Diagnostic Studies ?05/2017 nuclear stress ?Blood pressure demonstrated a hypertensive response to exercise. ?1 mm horizontal ST segment depressions in leads II, III, and aVF in recovery with nonspecific horizontal ST segment depressions in leads V5 and V6. There was excessive artifact with stress which limits interpretation. ?Defect 1: There is a large defect of moderate severity present in the mid anterior, mid anteroseptal, mid inferoseptal, apical anterior, apical septal and apical inferior location. ?Findings consistent with a large degree of ischemia. ?This is a high risk study. ?Nuclear stress EF: 50%. ?  ?  ?07/2017 echo ?Study  Conclusions ?  ?- Left ventricle: The cavity size was normal. Wall thickness was ?  normal. Systolic function was normal. The estimated ejection ?  fraction was in the range of 60% to 65%. Wall motion was normal; ?  there were no regional wall motion abnormalities. Left ?  ventricular diastolic function parameters were normal. Doppler ?  parameters are consistent with indeterminate ventricular filling ?  pressure. ?- Aortic valve: There was mild regurgitation. ?- Mitral valve: There was mild regurgitation. ?- Right ventricle: Systolic function appeared grossly normal (RV ?  poorly visualized, I doubt accuracy of TAPSE). ?- Pericardium, extracardiac: There was a left pleural effusion. ?  ? ? ? ?Assessment and Plan  ? ?1. CAD ?- doing well without symptoms, medical therapy limited by prior orthostatic symptoms and low bp's ?- continue current meds ?  ?2. Hyperlipidemia ?-request pcp labs, continue crestor and zetia.  ? ? ? ? ?[name], M.D. ?

## 2021-05-04 NOTE — Patient Instructions (Signed)
Medication Instructions:  Continue all current medications.   Labwork: none  Testing/Procedures: none  Follow-Up: 6 months   Any Other Special Instructions Will Be Listed Below (If Applicable).   If you need a refill on your cardiac medications before your next appointment, please call your pharmacy.  

## 2021-05-27 DIAGNOSIS — M9903 Segmental and somatic dysfunction of lumbar region: Secondary | ICD-10-CM | POA: Diagnosis not present

## 2021-05-27 DIAGNOSIS — S233XXA Sprain of ligaments of thoracic spine, initial encounter: Secondary | ICD-10-CM | POA: Diagnosis not present

## 2021-05-27 DIAGNOSIS — S338XXA Sprain of other parts of lumbar spine and pelvis, initial encounter: Secondary | ICD-10-CM | POA: Diagnosis not present

## 2021-05-27 DIAGNOSIS — M9902 Segmental and somatic dysfunction of thoracic region: Secondary | ICD-10-CM | POA: Diagnosis not present

## 2021-05-27 DIAGNOSIS — M9901 Segmental and somatic dysfunction of cervical region: Secondary | ICD-10-CM | POA: Diagnosis not present

## 2021-05-27 DIAGNOSIS — S134XXA Sprain of ligaments of cervical spine, initial encounter: Secondary | ICD-10-CM | POA: Diagnosis not present

## 2021-06-22 DIAGNOSIS — R7303 Prediabetes: Secondary | ICD-10-CM | POA: Diagnosis not present

## 2021-06-22 DIAGNOSIS — N1831 Chronic kidney disease, stage 3a: Secondary | ICD-10-CM | POA: Diagnosis not present

## 2021-06-22 DIAGNOSIS — I2510 Atherosclerotic heart disease of native coronary artery without angina pectoris: Secondary | ICD-10-CM | POA: Diagnosis not present

## 2021-06-22 DIAGNOSIS — N400 Enlarged prostate without lower urinary tract symptoms: Secondary | ICD-10-CM | POA: Diagnosis not present

## 2021-06-22 DIAGNOSIS — Z79899 Other long term (current) drug therapy: Secondary | ICD-10-CM | POA: Diagnosis not present

## 2021-06-22 DIAGNOSIS — E785 Hyperlipidemia, unspecified: Secondary | ICD-10-CM | POA: Diagnosis not present

## 2021-06-22 DIAGNOSIS — G4739 Other sleep apnea: Secondary | ICD-10-CM | POA: Diagnosis not present

## 2021-06-22 DIAGNOSIS — Z125 Encounter for screening for malignant neoplasm of prostate: Secondary | ICD-10-CM | POA: Diagnosis not present

## 2021-06-30 DIAGNOSIS — N401 Enlarged prostate with lower urinary tract symptoms: Secondary | ICD-10-CM | POA: Diagnosis not present

## 2021-06-30 DIAGNOSIS — E785 Hyperlipidemia, unspecified: Secondary | ICD-10-CM | POA: Diagnosis not present

## 2021-06-30 DIAGNOSIS — R7309 Other abnormal glucose: Secondary | ICD-10-CM | POA: Diagnosis not present

## 2021-06-30 DIAGNOSIS — R3129 Other microscopic hematuria: Secondary | ICD-10-CM | POA: Diagnosis not present

## 2021-06-30 DIAGNOSIS — I2510 Atherosclerotic heart disease of native coronary artery without angina pectoris: Secondary | ICD-10-CM | POA: Diagnosis not present

## 2021-06-30 DIAGNOSIS — N1831 Chronic kidney disease, stage 3a: Secondary | ICD-10-CM | POA: Diagnosis not present

## 2021-07-13 ENCOUNTER — Ambulatory Visit: Payer: PPO | Admitting: Urology

## 2021-07-13 VITALS — BP 119/67 | HR 90 | Ht 70.0 in | Wt 200.0 lb

## 2021-07-13 DIAGNOSIS — R3129 Other microscopic hematuria: Secondary | ICD-10-CM | POA: Diagnosis not present

## 2021-07-13 DIAGNOSIS — N400 Enlarged prostate without lower urinary tract symptoms: Secondary | ICD-10-CM

## 2021-07-13 LAB — MICROSCOPIC EXAMINATION
Epithelial Cells (non renal): NONE SEEN /hpf (ref 0–10)
Renal Epithel, UA: NONE SEEN /hpf
WBC, UA: NONE SEEN /hpf (ref 0–5)

## 2021-07-13 LAB — URINALYSIS, ROUTINE W REFLEX MICROSCOPIC
Bilirubin, UA: NEGATIVE
Glucose, UA: NEGATIVE
Leukocytes,UA: NEGATIVE
Nitrite, UA: NEGATIVE
Protein,UA: NEGATIVE
Specific Gravity, UA: 1.025 (ref 1.005–1.030)
Urobilinogen, Ur: 0.2 mg/dL (ref 0.2–1.0)
pH, UA: 5.5 (ref 5.0–7.5)

## 2021-07-13 MED ORDER — SOLIFENACIN SUCCINATE 5 MG PO TABS: 5.0000 mg | ORAL_TABLET | Freq: Every day | ORAL | 2 refills | Status: AC

## 2021-07-13 NOTE — Progress Notes (Unsigned)
07/13/2021 1:32 PM   Chase Guerrero August 15, 1945 [id number]  Referring provider: [name], MD [address] [place],  [place] [address]  No chief complaint on file.   HPI:  New patient-  1) BPH with lower urinary tract symptoms-patient complaining of frequency. AUA symptom score 12. He is on finasteride. Tamsulosin is mentioned but I don't see it on his med list. His prostate was 65 g on renal ultrasound in February 2023. His May 2023 PSA was 1.5 (3).  No surgery. He did have elevated PSA and two prostate bx in the past. He saw Dr. Nevada Crane in the past - last in 2020. Has h/o elevated psa (5 range) s/p prior trus bx x 2 last approx 2011. Path benign. On 5ari since.   2) microscopic hematuria-his May 2023 UA showed 11-30 red blood cells. No gross hematuria. A renal ultrasound was benign February 2023 with a 1.7 cm right renal cyst and 65 g prostate.  UA today, June 2023 - with 3-10 rbc.   Today, seen for the above.   He retired as a Environmental education officer at [employer]. He has a rx for NTG but doesn't carry it around. He had a CABG. No anticoagulations.     PMH: Past Medical History:  Diagnosis Date   Arthritis    Atrial fibrillation ([place])    Cancer ([place])    skin   Coronary artery disease    Enlarged prostate    GERD (gastroesophageal reflux disease)    Hypertension    Myocardial infarction ([facility])    mild   Pleural effusion    Sleep apnea    not wearing  cpap  not worn in 9-10 yrs    Surgical History: Past Surgical History:  Procedure Laterality Date   CHEST TUBE INSERTION Left 08/16/2017   Procedure: INSERTION PLEURAL DRAINAGE CATHETER;  Surgeon: Ivin Poot, MD;  Location: [place];  Service: Thoracic;  Laterality: Left;   COLONOSCOPY N/A 07/23/2015   Procedure: COLONOSCOPY;  Surgeon: Daneil Dolin, MD;  [facility];  Service: Endoscopy;  Laterality: N/A;  10:30 Am   COLONOSCOPY N/A 08/27/2020   Procedure: COLONOSCOPY;  Surgeon: Daneil Dolin, MD;   [facility];  Service: Endoscopy;  Laterality: N/A;  10:30am   CORONARY ARTERY BYPASS GRAFT N/A 06/27/2017   Procedure: CORONARY ARTERY BYPASS GRAFTING (CABG) x 2  WITH ENDOSCOPIC HARVESTING OF RIGHT SAPHENOUS VEIN;  Surgeon: Ivin Poot, MD;  Location: [place];  Service: Open Heart Surgery;  Laterality: N/A;   IR THORACENTESIS ASP PLEURAL SPACE W/IMG GUIDE  08/08/2017   LEFT HEART CATH AND CORONARY ANGIOGRAPHY N/A 06/14/2017   Procedure: LEFT HEART CATH AND CORONARY ANGIOGRAPHY;  Surgeon: Leonie Man, MD;  Location: [place] [facility];  Service: Cardiovascular;  Laterality: N/A;   POLYPECTOMY  08/27/2020   Procedure: POLYPECTOMY;  Surgeon: Daneil Dolin, MD;  [facility];  Service: Endoscopy;;  cecal x2   REMOVAL OF PLEURAL DRAINAGE CATHETER Left 10/13/2017   Procedure: REMOVAL OF PLEURAL DRAINAGE CATHETER;  Surgeon: Ivin Poot, MD;  Location[facility];  Service: Thoracic;  Laterality: Left;   Right hand surgery     Right knee arthroscopy     TEE WITHOUT CARDIOVERSION N/A 06/27/2017   Procedure: TRANSESOPHAGEAL ECHOCARDIOGRAM (TEE);  Surgeon: Prescott Gum, Collier Salina, MD;  Location: [place];  Service: Open Heart Surgery;  Laterality: N/A;    Home Medications:  Allergies as of 07/13/2021   No  Known Allergies      Medication List        Accurate as of July 13, 2021  1:32 PM. If you have any questions, ask your nurse or doctor.          acetaminophen 500 MG tablet Commonly known as: TYLENOL Take 1,000 mg by mouth every 6 (six) hours as needed.   aspirin EC 81 MG tablet Take 81 mg by mouth in the morning.   Cannabidiol Powd Apply 1 application topically 3 (three) times daily as needed (pain.). CBD cream   ezetimibe 10 MG tablet Commonly known as: ZETIA Take 10 mg by mouth in the morning.   ezetimibe 10 MG tablet Commonly known as: ZETIA Take 1 tablet by mouth daily.   finasteride 5 MG tablet Commonly known as: PROSCAR Take 5 mg by mouth in the  morning.   fluticasone 50 MCG/ACT nasal spray Commonly known as: FLONASE Place 1 spray into both nostrils daily as needed for allergies or rhinitis.   lisinopril 2.5 MG tablet Commonly known as: ZESTRIL TAKE ONE TABLET (2.'5MG'$  TOTAL) BY MOUTH DAILY   nitroGLYCERIN 0.4 MG SL tablet Commonly known as: NITROSTAT Place 0.4 mg under the tongue every 5 (five) minutes x 3 doses as needed for chest pain.   rosuvastatin 40 MG tablet Commonly known as: CRESTOR TAKE ONE TABLET ('40MG'$  TOTAL) BY MOUTH DAILY   Sensitive Eyes Saline Soln Place 1 drop into both eyes 3 (three) times daily.   Theratears 0.25 % Soln Generic drug: Carboxymethylcellulose Sodium Place 1-2 drops into both eyes 3 (three) times daily as needed (dry/irritation eyes).        Allergies: No Known Allergies  Family History: Family History  Problem Relation Age of Onset   Alzheimer's disease Mother    Arthritis Sister    Heart attack Brother    Hypercholesterolemia Sister    Liver disease Sister     Social History:  reports that he quit smoking about 33 years ago. His smoking use included cigarettes. He has a 15.00 pack-year smoking history. His smokeless tobacco use includes snuff. He reports that he does not drink alcohol and does not use drugs.   Physical Exam: BP 119/67   Pulse 90   Ht '5\' 10"'$  (1.778 m)   Wt 200 lb (90.7 kg)   BMI 28.70 kg/m   Constitutional:  Alert and oriented, No acute distress. HEENT: [place] AT, moist mucus membranes.  Trachea midline, no masses. Cardiovascular: No clubbing, cyanosis, or edema. Respiratory: Normal respiratory effort, no increased work of breathing. GI: Abdomen is soft, nontender, nondistended, no abdominal masses GU: No CVA tenderness Lymph: No cervical or inguinal lymphadenopathy. Skin: No rashes, bruises or suspicious lesions. Neurologic: Grossly intact, no focal deficits, moving all 4 extremities. Psychiatric: Normal mood and affect. DRE: prostate - 40 g. Smooth, no  hard area or nodule.    Laboratory Data: Lab Results  Component Value Date   WBC 7.9 08/16/2017   HGB 12.9 (L) 08/16/2017   HCT 43.8 08/16/2017   MCV 89.8 08/16/2017   PLT 348 08/16/2017    Lab Results  Component Value Date   CREATININE 1.39 (H) 09/20/2017    No results found for: PSA  No results found for: TESTOSTERONE  Lab Results  Component Value Date   HGBA1C 5.9 (H) 06/24/2017    Urinalysis    Component Value Date/Time   COLORURINE YELLOW 06/24/2017 0919   APPEARANCEUR CLEAR 06/24/2017 0919   LABSPEC 1.013 06/24/2017 0919  PHURINE 5.0 06/24/2017 0919   GLUCOSEU NEGATIVE 06/24/2017 0919   HGBUR MODERATE (A) 06/24/2017 0919   BILIRUBINUR NEGATIVE 06/24/2017 0919   KETONESUR NEGATIVE 06/24/2017 0919   PROTEINUR NEGATIVE 06/24/2017 0919   NITRITE NEGATIVE 06/24/2017 0919   LEUKOCYTESUR NEGATIVE 06/24/2017 0919    Lab Results  Component Value Date   BACTERIA RARE (A) 06/24/2017    Pertinent Imaging: Renal US - images  Results for orders placed during the hospital encounter of 03/12/21  US RENAL  Narrative CLINICAL DATA:  Chronic kidney disease.  EXAM: RENAL / URINARY TRACT ULTRASOUND COMPLETE  COMPARISON:  None.  FINDINGS: Right Kidney:  Renal measurements: 10.0 x 5.9 x 4.8 cm = volume: 147 mL. 1.8 cm cyst. Normal echotexture. No hydronephrosis.  Left Kidney:  Renal measurements: 10.0 x 5.3 x 4.8 cm = volume: 134 mL. Echogenicity within normal limits. No mass or hydronephrosis visualized.  Bladder:  Appears normal for degree of bladder distention.  Other:  Moderately to markedly enlarged prostate gland protruding into the base of the urinary bladder.  IMPRESSION: 1. Unremarkable kidneys. 2. Moderate to marked prostatic hypertrophy.   Electronically Signed By: Claudie Revering M.D. On: 03/14/2021 15:48  No results found for this or any previous visit.  No results found for this or any previous visit.  No results found for  this or any previous visit.   Assessment & Plan:    1. Benign prostatic hyperplasia, unspecified whether lower urinary tract symptoms present Disc nature r/b to alpha blocker, 5ari, adding OAB med or daily pde5i. Start solifenacin. Check to see if he is taking tamsulosin. Also disc prostate procedures and he will consider.   - Urinalysis, Routine w reflex microscopic  2. MH - see back for cystoscopy to rule out any cancer and better determine etiology of his urinary symptoms.   No follow-ups on file.  Festus Aloe, MD  [facility]  [address] [place], [place] [address] [phone]

## 2021-07-29 DIAGNOSIS — M9903 Segmental and somatic dysfunction of lumbar region: Secondary | ICD-10-CM | POA: Diagnosis not present

## 2021-07-29 DIAGNOSIS — M9901 Segmental and somatic dysfunction of cervical region: Secondary | ICD-10-CM | POA: Diagnosis not present

## 2021-07-29 DIAGNOSIS — S134XXA Sprain of ligaments of cervical spine, initial encounter: Secondary | ICD-10-CM | POA: Diagnosis not present

## 2021-07-29 DIAGNOSIS — S338XXA Sprain of other parts of lumbar spine and pelvis, initial encounter: Secondary | ICD-10-CM | POA: Diagnosis not present

## 2021-07-29 DIAGNOSIS — M9902 Segmental and somatic dysfunction of thoracic region: Secondary | ICD-10-CM | POA: Diagnosis not present

## 2021-07-29 DIAGNOSIS — S233XXA Sprain of ligaments of thoracic spine, initial encounter: Secondary | ICD-10-CM | POA: Diagnosis not present

## 2021-08-03 ENCOUNTER — Other Ambulatory Visit: Payer: Self-pay | Admitting: Cardiology

## 2021-08-24 ENCOUNTER — Ambulatory Visit (INDEPENDENT_AMBULATORY_CARE_PROVIDER_SITE_OTHER): Payer: PPO | Admitting: Urology

## 2021-08-24 ENCOUNTER — Encounter: Payer: Self-pay | Admitting: Urology

## 2021-08-24 VITALS — BP 121/71 | HR 82 | Ht 70.0 in | Wt 200.0 lb

## 2021-08-24 DIAGNOSIS — R3129 Other microscopic hematuria: Secondary | ICD-10-CM | POA: Diagnosis not present

## 2021-08-24 DIAGNOSIS — N400 Enlarged prostate without lower urinary tract symptoms: Secondary | ICD-10-CM | POA: Diagnosis not present

## 2021-08-24 DIAGNOSIS — R350 Frequency of micturition: Secondary | ICD-10-CM

## 2021-08-24 LAB — MICROSCOPIC EXAMINATION
Renal Epithel, UA: NONE SEEN /hpf
WBC, UA: NONE SEEN /hpf (ref 0–5)

## 2021-08-24 LAB — URINALYSIS, ROUTINE W REFLEX MICROSCOPIC
Bilirubin, UA: NEGATIVE
Glucose, UA: NEGATIVE
Ketones, UA: NEGATIVE
Leukocytes,UA: NEGATIVE
Nitrite, UA: NEGATIVE
Protein,UA: NEGATIVE
Specific Gravity, UA: 1.01 (ref 1.005–1.030)
Urobilinogen, Ur: 0.2 mg/dL (ref 0.2–1.0)
pH, UA: 5.5 (ref 5.0–7.5)

## 2021-08-24 MED ORDER — CIPROFLOXACIN HCL 500 MG PO TABS
500.0000 mg | ORAL_TABLET | Freq: Once | ORAL | Status: AC
  Administered 2021-08-24: 500 mg via ORAL

## 2021-08-24 NOTE — Progress Notes (Signed)
   08/24/21  CC: No chief complaint on file.   HPI: F/u -   1) BPH with lower urinary tract symptoms-patient complaining of frequency. AUA symptom score 12. He is on finasteride. Tamsulosin is mentioned but I don't see it on his med list. No surgery. His prostate was 65 g on renal ultrasound in February 2023.    He did have elevated PSA and two prostate bx in the past. He saw Dr. [place] Crane in the past - last in 2020. Has h/o elevated psa (5 range) s/p prior trus bx x 2 last approx 2011. Path benign. On 5ari since.   His May 2023 PSA was 1.5 (3).   2) microscopic hematuria-his May 2023 UA showed 11-30 red blood cells. No gross hematuria. A renal ultrasound was benign February 2023 with a 1.7 cm right renal cyst and 65 g prostate.   UA June 2023 - with 3-10 rbc.    Today, seen for the above.  He is here for cystoscopy for microscopic hematuria and management of BPH with lower urinary tract symptoms.  He has no dysuria or gross hematuria. He noticed some dark urine.    He retired as a Environmental education officer at [employer]. He has a rx for NTG but doesn't carry it around. He had a CABG. No anticoagulations.    Blood pressure 121/71, pulse 82, height '5\' 10"'$  (1.778 m), weight 200 lb (90.7 kg). NED. A&Ox3.   No respiratory distress   Abd soft, NT, ND Normal phallus with bilateral descended testicles  Cystoscopy Procedure Note  Patient identification was confirmed, informed consent was obtained, and patient was prepped using Betadine solution.  Lidocaine jelly was administered per urethral meatus.     Pre-Procedure: - Inspection reveals a normal caliber ureteral meatus.  Procedure: The flexible cystoscope was introduced without difficulty - No urethral strictures/lesions are present. -Prostate obstructing with lateral lobe hypertrophy and a small median lobe -Normal bladder neck - Bilateral ureteral orifices identified - Bladder mucosa  reveals no ulcers, tumors, or lesions - No bladder stones -  No trabeculation  Retroflexion shows normal bladder and bladder neck, small median lobe   Post-Procedure: - Patient tolerated the procedure well  Assessment/ Plan:  Microscopic hematuria-benign evaluation  BPH with lower urinary tract symptoms-we will continue tamsulosin and finasteride.  We discussed adding an overactive bladder medicine or a daily PDE 5 inhibitor for his frequency.  We also discussed procedures in the office or to the operating room.  He will continue tamsulosin and finasteride.  No follow-ups on file.  Festus Aloe, MD

## 2021-08-26 DIAGNOSIS — S233XXA Sprain of ligaments of thoracic spine, initial encounter: Secondary | ICD-10-CM | POA: Diagnosis not present

## 2021-08-26 DIAGNOSIS — M9902 Segmental and somatic dysfunction of thoracic region: Secondary | ICD-10-CM | POA: Diagnosis not present

## 2021-08-26 DIAGNOSIS — S338XXA Sprain of other parts of lumbar spine and pelvis, initial encounter: Secondary | ICD-10-CM | POA: Diagnosis not present

## 2021-08-26 DIAGNOSIS — S134XXA Sprain of ligaments of cervical spine, initial encounter: Secondary | ICD-10-CM | POA: Diagnosis not present

## 2021-08-26 DIAGNOSIS — M9901 Segmental and somatic dysfunction of cervical region: Secondary | ICD-10-CM | POA: Diagnosis not present

## 2021-08-26 DIAGNOSIS — M9903 Segmental and somatic dysfunction of lumbar region: Secondary | ICD-10-CM | POA: Diagnosis not present

## 2021-09-23 DIAGNOSIS — S233XXA Sprain of ligaments of thoracic spine, initial encounter: Secondary | ICD-10-CM | POA: Diagnosis not present

## 2021-09-23 DIAGNOSIS — M9901 Segmental and somatic dysfunction of cervical region: Secondary | ICD-10-CM | POA: Diagnosis not present

## 2021-09-23 DIAGNOSIS — M9903 Segmental and somatic dysfunction of lumbar region: Secondary | ICD-10-CM | POA: Diagnosis not present

## 2021-09-23 DIAGNOSIS — S134XXA Sprain of ligaments of cervical spine, initial encounter: Secondary | ICD-10-CM | POA: Diagnosis not present

## 2021-09-23 DIAGNOSIS — S338XXA Sprain of other parts of lumbar spine and pelvis, initial encounter: Secondary | ICD-10-CM | POA: Diagnosis not present

## 2021-09-23 DIAGNOSIS — M9902 Segmental and somatic dysfunction of thoracic region: Secondary | ICD-10-CM | POA: Diagnosis not present

## 2021-10-21 DIAGNOSIS — L570 Actinic keratosis: Secondary | ICD-10-CM | POA: Diagnosis not present

## 2021-10-26 DIAGNOSIS — R7303 Prediabetes: Secondary | ICD-10-CM | POA: Diagnosis not present

## 2021-10-26 DIAGNOSIS — E785 Hyperlipidemia, unspecified: Secondary | ICD-10-CM | POA: Diagnosis not present

## 2021-10-26 DIAGNOSIS — I2510 Atherosclerotic heart disease of native coronary artery without angina pectoris: Secondary | ICD-10-CM | POA: Diagnosis not present

## 2021-10-26 DIAGNOSIS — Z79899 Other long term (current) drug therapy: Secondary | ICD-10-CM | POA: Diagnosis not present

## 2021-10-28 DIAGNOSIS — M9901 Segmental and somatic dysfunction of cervical region: Secondary | ICD-10-CM | POA: Diagnosis not present

## 2021-10-28 DIAGNOSIS — S338XXA Sprain of other parts of lumbar spine and pelvis, initial encounter: Secondary | ICD-10-CM | POA: Diagnosis not present

## 2021-10-28 DIAGNOSIS — S233XXA Sprain of ligaments of thoracic spine, initial encounter: Secondary | ICD-10-CM | POA: Diagnosis not present

## 2021-10-28 DIAGNOSIS — M9903 Segmental and somatic dysfunction of lumbar region: Secondary | ICD-10-CM | POA: Diagnosis not present

## 2021-10-28 DIAGNOSIS — M9902 Segmental and somatic dysfunction of thoracic region: Secondary | ICD-10-CM | POA: Diagnosis not present

## 2021-10-28 DIAGNOSIS — S134XXA Sprain of ligaments of cervical spine, initial encounter: Secondary | ICD-10-CM | POA: Diagnosis not present

## 2021-11-02 ENCOUNTER — Encounter: Payer: Self-pay | Admitting: Internal Medicine

## 2021-11-02 DIAGNOSIS — Z23 Encounter for immunization: Secondary | ICD-10-CM | POA: Diagnosis not present

## 2021-11-02 DIAGNOSIS — N183 Chronic kidney disease, stage 3 unspecified: Secondary | ICD-10-CM | POA: Diagnosis not present

## 2021-11-02 DIAGNOSIS — R1310 Dysphagia, unspecified: Secondary | ICD-10-CM | POA: Diagnosis not present

## 2021-11-02 DIAGNOSIS — I2510 Atherosclerotic heart disease of native coronary artery without angina pectoris: Secondary | ICD-10-CM | POA: Diagnosis not present

## 2021-11-04 ENCOUNTER — Ambulatory Visit: Payer: PPO | Attending: Cardiology | Admitting: Cardiology

## 2021-11-04 ENCOUNTER — Encounter: Payer: Self-pay | Admitting: Cardiology

## 2021-11-04 VITALS — BP 110/64 | HR 78 | Ht 70.0 in | Wt 189.6 lb

## 2021-11-04 DIAGNOSIS — E782 Mixed hyperlipidemia: Secondary | ICD-10-CM

## 2021-11-04 DIAGNOSIS — I2510 Atherosclerotic heart disease of native coronary artery without angina pectoris: Secondary | ICD-10-CM

## 2021-11-04 NOTE — Patient Instructions (Signed)

## 2021-11-04 NOTE — Progress Notes (Signed)
Clinical Summary [name] is a 76 y.o.male seen today for follow up of the following medical problems.    1. CAD - seen in 05/2017 for chest pain - 05/2017 high risk nuclear stress test as reported below, referred for cath - 06/2017 cath distal LM to LAD 100%, LVEF 35-45% by LV gram, referred for CABG  - 06/27/17 CABG with LIMA-LAD, SVG-diag -07/2017 echo LVEF 60-65%.        - he is off metoprolol due to low bp's and orthostatic symptoms -no chest pain, no SOB/DOE - compliant with meds       2. Hyperlipidemia  - labs followed by pcp - we had preivously changed lipitor to crestor, he is also on zetia.  - 09/2020 TC 127 TG 92 HDL 36 LDL 73 - reports labs with pcp earlier this week.     3. Dizziness - infrequent symptoms.    Past Medical History:  Diagnosis Date   Arthritis    Atrial fibrillation ([place])    Cancer ([place])    skin   Coronary artery disease    Enlarged prostate    GERD (gastroesophageal reflux disease)    Hypertension    Myocardial infarction ([facility])    mild   Pleural effusion    Sleep apnea    not wearing  cpap  not worn in 9-10 yrs     No Known Allergies   Current Outpatient Medications  Medication Sig Dispense Refill   acetaminophen (TYLENOL) 500 MG tablet Take 1,000 mg by mouth every 6 (six) hours as needed.     aspirin EC 81 MG tablet Take 81 mg by mouth in the morning.     Cannabidiol POWD Apply 1 application topically 3 (three) times daily as needed (pain.). CBD cream     Carboxymethylcellulose Sodium (THERATEARS) 0.25 % SOLN Place 1-2 drops into both eyes 3 (three) times daily as needed (dry/irritation eyes).     ezetimibe (ZETIA) 10 MG tablet Take 10 mg by mouth in the morning.     ezetimibe (ZETIA) 10 MG tablet Take 1 tablet by mouth daily.     finasteride (PROSCAR) 5 MG tablet Take 5 mg by mouth in the morning.     fluticasone (FLONASE) 50 MCG/ACT nasal spray Place 1 spray into both nostrils daily as needed for allergies or rhinitis.      lisinopril (ZESTRIL) 2.5 MG tablet TAKE ONE TABLET (2.'5MG'$  TOTAL) BY MOUTH DAILY 30 tablet 6   nitroGLYCERIN (NITROSTAT) 0.4 MG SL tablet Place 0.4 mg under the tongue every 5 (five) minutes x 3 doses as needed for chest pain.     rosuvastatin (CRESTOR) 40 MG tablet TAKE ONE TABLET ('40MG'$  TOTAL) BY MOUTH DAILY 30 tablet 11   Soft Lens Products (SENSITIVE EYES SALINE) SOLN Place 1 drop into both eyes 3 (three) times daily.     solifenacin (VESICARE) 5 MG tablet Take 1 tablet (5 mg total) by mouth daily. 30 tablet 2   No current facility-administered medications for this visit.     Past Surgical History:  Procedure Laterality Date   CHEST TUBE INSERTION Left 08/16/2017   Procedure: INSERTION PLEURAL DRAINAGE CATHETER;  Surgeon: Ivin Poot, MD;  Location: [place];  Service: Thoracic;  Laterality: Left;   COLONOSCOPY N/A 07/23/2015   Procedure: COLONOSCOPY;  Surgeon: Daneil Dolin, MD;  [facility];  Service: Endoscopy;  Laterality: N/A;  10:30 Am   COLONOSCOPY N/A 08/27/2020   Procedure: COLONOSCOPY;  Surgeon: Daneil Dolin, MD;  [facility];  Service: Endoscopy;  Laterality: N/A;  10:30am   CORONARY ARTERY BYPASS GRAFT N/A 06/27/2017   Procedure: CORONARY ARTERY BYPASS GRAFTING (CABG) x 2  WITH ENDOSCOPIC HARVESTING OF RIGHT SAPHENOUS VEIN;  Surgeon: Ivin Poot, MD;  Location: [place];  Service: Open Heart Surgery;  Laterality: N/A;   IR THORACENTESIS ASP PLEURAL SPACE W/IMG GUIDE  08/08/2017   LEFT HEART CATH AND CORONARY ANGIOGRAPHY N/A 06/14/2017   Procedure: LEFT HEART CATH AND CORONARY ANGIOGRAPHY;  Surgeon: Leonie Man, MD;  Location: [place] [facility];  Service: Cardiovascular;  Laterality: N/A;   POLYPECTOMY  08/27/2020   Procedure: POLYPECTOMY;  Surgeon: Daneil Dolin, MD;  [facility];  Service: Endoscopy;;  cecal x2   REMOVAL OF PLEURAL DRAINAGE CATHETER Left 10/13/2017   Procedure: REMOVAL OF PLEURAL DRAINAGE CATHETER;  Surgeon: Ivin Poot, MD;  [facility];  Service: Thoracic;  Laterality: Left;   Right hand surgery     Right knee arthroscopy     TEE WITHOUT CARDIOVERSION N/A 06/27/2017   Procedure: TRANSESOPHAGEAL ECHOCARDIOGRAM (TEE);  Surgeon: Prescott Gum, Collier Salina, MD;  Location: [place];  Service: Open Heart Surgery;  Laterality: N/A;     No Known Allergies    Family History  Problem Relation Age of Onset   Alzheimer's disease Mother    Arthritis Sister    Heart attack Brother    Hypercholesterolemia Sister    Liver disease Sister      Social History [name] reports that he quit smoking about 33 years ago. His smoking use included cigarettes. He has a 15.00 pack-year smoking history. His smokeless tobacco use includes snuff. [name] reports no history of alcohol use.   Review of Systems CONSTITUTIONAL: No weight loss, fever, chills, weakness or fatigue.  HEENT: Eyes: No visual loss, blurred vision, double vision or yellow sclerae.No hearing loss, sneezing, congestion, runny nose or sore throat.  SKIN: No rash or itching.  CARDIOVASCULAR: per hpi RESPIRATORY: No shortness of breath, cough or sputum.  GASTROINTESTINAL: No anorexia, nausea, vomiting or diarrhea. No abdominal pain or blood.  GENITOURINARY: No burning on urination, no polyuria NEUROLOGICAL: No headache, dizziness, syncope, paralysis, ataxia, numbness or tingling in the extremities. No change in bowel or bladder control.  MUSCULOSKELETAL: No muscle, back pain, joint pain or stiffness.  LYMPHATICS: No enlarged nodes. No history of splenectomy.  PSYCHIATRIC: No history of depression or anxiety.  ENDOCRINOLOGIC: No reports of sweating, cold or heat intolerance. No polyuria or polydipsia.  Marland Kitchen   Physical Examination Today's Vitals   11/04/21 0929  BP: 110/64  Pulse: 78  SpO2: 97%  Weight: 189 lb 9.6 oz (86 kg)  Height: '5\' 10"'$  (1.778 m)   Body mass index is 27.2 kg/m.  Gen: resting comfortably, no acute distress HEENT: no  scleral icterus, pupils equal round and reactive, no palptable cervical adenopathy,  CV: RRR, no m/rg, no jvd Resp: Clear to auscultation bilaterally GI: abdomen is soft, non-tender, non-distended, normal bowel sounds, no hepatosplenomegaly MSK: extremities are warm, no edema.  Skin: warm, no rash Neuro:  no focal deficits Psych: appropriate affect   Diagnostic Studies 05/2017 nuclear stress Blood pressure demonstrated a hypertensive response to exercise. 1 mm horizontal ST segment depressions in leads II, III, and aVF in recovery with nonspecific horizontal ST segment depressions in leads V5 and V6. There was excessive artifact with stress which limits interpretation. Defect 1: There is a  large defect of moderate severity present in the mid anterior, mid anteroseptal, mid inferoseptal, apical anterior, apical septal and apical inferior location. Findings consistent with a large degree of ischemia. This is a high risk study. Nuclear stress EF: 50%.     07/2017 echo Study Conclusions   - Left ventricle: The cavity size was normal. Wall thickness was   normal. Systolic function was normal. The estimated ejection   fraction was in the range of 60% to 65%. Wall motion was normal;   there were no regional wall motion abnormalities. Left   ventricular diastolic function parameters were normal. Doppler   parameters are consistent with indeterminate ventricular filling   pressure. - Aortic valve: There was mild regurgitation. - Mitral valve: There was mild regurgitation. - Right ventricle: Systolic function appeared grossly normal (RV   poorly visualized, I doubt accuracy of TAPSE). - Pericardium, extracardiac: There was a left pleural effusion.          Assessment and Plan  1. CAD - medical therapy limited by prior orthostatic symptoms and low bp's - no recent symptoms, continue current meds - EKG today SR, no ischemic changes.    2. Hyperlipidemia -continue current meds,  request labs from pcp        Arnoldo Chase Guerrero, M.D.

## 2021-11-19 ENCOUNTER — Ambulatory Visit: Payer: PPO | Admitting: Internal Medicine

## 2021-11-24 DIAGNOSIS — S233XXA Sprain of ligaments of thoracic spine, initial encounter: Secondary | ICD-10-CM | POA: Diagnosis not present

## 2021-11-24 DIAGNOSIS — M9901 Segmental and somatic dysfunction of cervical region: Secondary | ICD-10-CM | POA: Diagnosis not present

## 2021-11-24 DIAGNOSIS — M9903 Segmental and somatic dysfunction of lumbar region: Secondary | ICD-10-CM | POA: Diagnosis not present

## 2021-11-24 DIAGNOSIS — S338XXA Sprain of other parts of lumbar spine and pelvis, initial encounter: Secondary | ICD-10-CM | POA: Diagnosis not present

## 2021-11-24 DIAGNOSIS — S134XXA Sprain of ligaments of cervical spine, initial encounter: Secondary | ICD-10-CM | POA: Diagnosis not present

## 2021-11-24 DIAGNOSIS — M9902 Segmental and somatic dysfunction of thoracic region: Secondary | ICD-10-CM | POA: Diagnosis not present

## 2021-11-26 ENCOUNTER — Encounter: Payer: Self-pay | Admitting: Internal Medicine

## 2021-11-26 ENCOUNTER — Ambulatory Visit (INDEPENDENT_AMBULATORY_CARE_PROVIDER_SITE_OTHER): Payer: PPO | Admitting: Internal Medicine

## 2021-11-26 ENCOUNTER — Encounter: Payer: Self-pay | Admitting: *Deleted

## 2021-11-26 VITALS — BP 110/63 | HR 88 | Temp 97.9°F | Ht 70.0 in | Wt 189.6 lb

## 2021-11-26 DIAGNOSIS — R1319 Other dysphagia: Secondary | ICD-10-CM

## 2021-11-26 NOTE — Progress Notes (Signed)
Primary Care Physician:  [name], MD Primary Gastroenterologist:  Dr. Gala Romney  Pre-Procedure History & Physical: HPI:  Chase Guerrero is a 76 y.o. male here for chronic esophageal dysphagia to solid food.  Patient's had symptoms for at least a few years  - insidiously worsening lately.  Main issue with meats.  Earlier in life had lots of reflux but no reflux on these days.  No nausea or vomiting.  He has been able to eat well otherwise. No antireflux meds.  Has had any melena or rectal bleeding.  No prior history of peptic ulcer disease, etc.  No prior EGD.  Distant smoking history.  Very little alcohol over the years. Recent colonoscopy,  small adenomas diverticulosis-no future surveillance recommended. He has lost 10 pounds in the past year and a half by her scales. History of CAD and CABG.  History of atrial fibrillation.  Not anticoagulated.  Takes 1 baby aspirin every 3 days. Past Medical History:  Diagnosis Date   Arthritis    Atrial fibrillation ([place])    Cancer ([facility])    skin   Coronary artery disease    Enlarged prostate    GERD (gastroesophageal reflux disease)    Hypertension    Myocardial infarction ([facility])    mild   Pleural effusion    Sleep apnea    not wearing  cpap  not worn in 9-10 yrs    Past Surgical History:  Procedure Laterality Date   CHEST TUBE INSERTION Left 08/16/2017   Procedure: INSERTION PLEURAL DRAINAGE CATHETER;  Surgeon: Ivin Poot, MD;  Location: [place];  Service: Thoracic;  Laterality: Left;   COLONOSCOPY N/A 07/23/2015   Procedure: COLONOSCOPY;  Surgeon: Daneil Dolin, MD;  [facility];  Service: Endoscopy;  Laterality: N/A;  10:30 Am   COLONOSCOPY N/A 08/27/2020   Procedure: COLONOSCOPY;  Surgeon: Daneil Dolin, MD;  [facility];  Service: Endoscopy;  Laterality: N/A;  10:30am   CORONARY ARTERY BYPASS GRAFT N/A 06/27/2017   Procedure: CORONARY ARTERY BYPASS GRAFTING (CABG) x 2  WITH ENDOSCOPIC HARVESTING OF RIGHT  SAPHENOUS VEIN;  Surgeon: Ivin Poot, MD;  Location: [place];  Service: Open Heart Surgery;  Laterality: N/A;   IR THORACENTESIS ASP PLEURAL SPACE W/IMG GUIDE  08/08/2017   LEFT HEART CATH AND CORONARY ANGIOGRAPHY N/A 06/14/2017   Procedure: LEFT HEART CATH AND CORONARY ANGIOGRAPHY;  Surgeon: Leonie Man, MD;  Location: [place] [facility];  Service: Cardiovascular;  Laterality: N/A;   POLYPECTOMY  08/27/2020   Procedure: POLYPECTOMY;  Surgeon: Daneil Dolin, MD;  [facility];  Service: Endoscopy;;  cecal x2   REMOVAL OF PLEURAL DRAINAGE CATHETER Left 10/13/2017   Procedure: REMOVAL OF PLEURAL DRAINAGE CATHETER;  Surgeon: Ivin Poot, MD;  Location: [facility];  Service: Thoracic;  Laterality: Left;   Right hand surgery     Right knee arthroscopy     TEE WITHOUT CARDIOVERSION N/A 06/27/2017   Procedure: TRANSESOPHAGEAL ECHOCARDIOGRAM (TEE);  Surgeon: Prescott Gum, Collier Salina, MD;  Location: [place];  Service: Open Heart Surgery;  Laterality: N/A;    Prior to Admission medications   Medication Sig Start Date End Date Taking? Authorizing Provider  acetaminophen (TYLENOL) 500 MG tablet Take 1,000 mg by mouth every 6 (six) hours as needed.   Yes [provider]  aspirin EC 81 MG tablet Take 81 mg by mouth in the morning.   Yes [provider]  Cannabidiol POWD  Apply 1 application topically 3 (three) times daily as needed (pain.). CBD cream   Yes [provider]  Carboxymethylcellulose Sodium (THERATEARS) 0.25 % SOLN Place 1-2 drops into both eyes 3 (three) times daily as needed (dry/irritation eyes).   Yes [provider]  ezetimibe (ZETIA) 10 MG tablet Take 1 tablet by mouth daily. 01/07/21  Yes [provider]  finasteride (PROSCAR) 5 MG tablet Take 5 mg by mouth in the morning.   Yes [provider]  fluticasone (FLONASE) 50 MCG/ACT nasal spray Place 1 spray into both nostrils daily as needed for allergies or rhinitis.   Yes [provider]  lisinopril (ZESTRIL) 2.5 MG tablet TAKE ONE TABLET (2.'5MG'$  TOTAL) BY MOUTH DAILY 08/03/21  Yes Branch, Alphonse Guild, MD  nitroGLYCERIN (NITROSTAT) 0.4 MG SL tablet Place 0.4 mg under the tongue every 5 (five) minutes x 3 doses as needed for chest pain. 05/31/17  Yes [provider]  rosuvastatin (CRESTOR) 40 MG tablet TAKE ONE TABLET ('40MG'$  TOTAL) BY MOUTH DAILY 03/23/21  Yes Branch, Alphonse Guild, MD  Soft Lens Products (SENSITIVE EYES SALINE) SOLN Place 1 drop into both eyes 3 (three) times daily.   Yes [provider]  solifenacin (VESICARE) 5 MG tablet Take 1 tablet (5 mg total) by mouth daily. 07/13/21 07/13/22 Yes Festus Aloe, MD    Allergies as of 11/26/2021   (No Known Allergies)    Family History  Problem Relation Age of Onset   Alzheimer's disease Mother    Arthritis Sister    Heart attack Brother    Hypercholesterolemia Sister    Liver disease Sister     Social History   Socioeconomic History   Marital status: Married    Spouse name: Not on file   Number of children: Not on file   Years of education: Not on file   Highest education level: Not on file  Occupational History   Occupation: Retired  Tobacco Use   Smoking status: Former    Packs/day: 0.50    Years: 30.00    Total pack years: 15.00    Types: Cigarettes    Quit date: 02/16/1988    Years since quitting: 33.8   Smokeless tobacco: Current    Types: Snuff   Tobacco comments:    USING NICORETTE GUM TO TRY AND STOP USING SMOKELESS TOBACCO  Vaping Use   Vaping Use: Never used  Substance and Sexual Activity   Alcohol use: No   Drug use: No   Sexual activity: Not Currently  Other Topics Concern   Not on file  Social History Narrative   Not on file   Social Determinants of Health   Financial Resource Strain: Not on file  Food Insecurity: Not on file  Transportation Needs: Not on file  Physical Activity: Not on file  Stress: Not on file  Social Connections: Not on file   Intimate Partner Violence: Not on file    Review of Systems: See HPI, otherwise negative ROS  Physical Exam: BP 110/63 (BP Location: Right Arm, Patient Position: Sitting, Cuff Size: Normal)   Pulse 88   Temp 97.9 F (36.6 C) (Oral)   Ht '5\' 10"'$  (1.778 m)   Wt 189 lb 9.6 oz (86 kg)   BMI 27.20 kg/m  General:   Alert,   pleasant and cooperative in NAD Mouth:  No deformity or lesions. Neck:  Supple; no masses or thyromegaly. No significant cervical adenopathy. Lungs:  Clear throughout to auscultation.   No wheezes,  crackles, or rhonchi. No acute distress. Heart:  Regular rate and rhythm; no murmurs, clicks, rubs,  or gallops. Abdomen: Non-distended, normal bowel sounds.  Soft and nontender without appreciable mass or hepatosplenomegaly.  Pulses:  Normal pulses noted. Extremities:  Without clubbing or edema.  Impression/Plan: 75 year old gentleman here for further evaluation of subtle progressive chronic esophageal dysphagia mainly to solid food.  Denies any typical reflux symptoms these days.  Distant history of tobacco exposure.  Alcohol exposure as well over the years.  I agree with Dr. Willey Blade, further evaluation is warranted. I suspect more likely he has a benign lesion such as a ring, occult peptic stricture, web, etc. Although a neoplastic process cannot be excluded at this time.   Recommendations:  I discussed the importance of work-up as the cause of dysphagia.  I discussed proceeding with an EGD with potential esophageal dilation as feasible/appropriate vs.starting the evaluation with a barium pill esophagram.  Even if we initiate evaluation with a barium study, he will likely ultimately come to upper endoscopic evaluation.  After some discussion, patient has opted to go straight to EGD which I feel is appropriate. The risks, benefits, limitations, alternatives and imponderables have been reviewed with the patient. Potential for esophageal dilation, biopsy, etc. have also been  reviewed.  Questions have been answered. All parties agreeable.      Notice: This dictation was prepared with Dragon dictation along with smaller phrase technology. Any transcriptional errors that result from this process are unintentional and may not be corrected upon review.

## 2021-11-26 NOTE — Patient Instructions (Signed)
It was good seeing you today!  As discussed, we will proceed with an upper endoscopy with esophageal dilation as appropriate for dysphagia-ASA 2  Further recommendations to follow after endoscopic evaluation has been carried out.

## 2021-12-22 DIAGNOSIS — M9903 Segmental and somatic dysfunction of lumbar region: Secondary | ICD-10-CM | POA: Diagnosis not present

## 2021-12-22 DIAGNOSIS — M9902 Segmental and somatic dysfunction of thoracic region: Secondary | ICD-10-CM | POA: Diagnosis not present

## 2021-12-22 DIAGNOSIS — S134XXA Sprain of ligaments of cervical spine, initial encounter: Secondary | ICD-10-CM | POA: Diagnosis not present

## 2021-12-22 DIAGNOSIS — M9901 Segmental and somatic dysfunction of cervical region: Secondary | ICD-10-CM | POA: Diagnosis not present

## 2021-12-22 DIAGNOSIS — S338XXA Sprain of other parts of lumbar spine and pelvis, initial encounter: Secondary | ICD-10-CM | POA: Diagnosis not present

## 2021-12-22 DIAGNOSIS — S233XXA Sprain of ligaments of thoracic spine, initial encounter: Secondary | ICD-10-CM | POA: Diagnosis not present

## 2022-01-04 ENCOUNTER — Ambulatory Visit (HOSPITAL_COMMUNITY)
Admission: RE | Admit: 2022-01-04 | Discharge: 2022-01-04 | Disposition: A | Discharge status: Home / Self Care | Payer: PPO | Source: Ambulatory Visit | Attending: Internal Medicine | Admitting: Internal Medicine

## 2022-01-04 ENCOUNTER — Ambulatory Visit (HOSPITAL_BASED_OUTPATIENT_CLINIC_OR_DEPARTMENT_OTHER): Payer: PPO | Admitting: Anesthesiology

## 2022-01-04 ENCOUNTER — Encounter (HOSPITAL_COMMUNITY): Payer: Self-pay | Admitting: Internal Medicine

## 2022-01-04 ENCOUNTER — Other Ambulatory Visit: Payer: Self-pay

## 2022-01-04 ENCOUNTER — Encounter (HOSPITAL_COMMUNITY)
Admission: RE | Disposition: A | Discharge status: Home / Self Care | Payer: Self-pay | Source: Ambulatory Visit | Attending: Internal Medicine

## 2022-01-04 ENCOUNTER — Ambulatory Visit (HOSPITAL_COMMUNITY): Payer: PPO | Admitting: Anesthesiology

## 2022-01-04 DIAGNOSIS — I10 Essential (primary) hypertension: Secondary | ICD-10-CM | POA: Insufficient documentation

## 2022-01-04 DIAGNOSIS — R1310 Dysphagia, unspecified: Secondary | ICD-10-CM | POA: Diagnosis not present

## 2022-01-04 DIAGNOSIS — I25119 Atherosclerotic heart disease of native coronary artery with unspecified angina pectoris: Secondary | ICD-10-CM | POA: Insufficient documentation

## 2022-01-04 DIAGNOSIS — I252 Old myocardial infarction: Secondary | ICD-10-CM | POA: Insufficient documentation

## 2022-01-04 DIAGNOSIS — I4891 Unspecified atrial fibrillation: Secondary | ICD-10-CM | POA: Diagnosis not present

## 2022-01-04 DIAGNOSIS — G4730 Sleep apnea, unspecified: Secondary | ICD-10-CM | POA: Insufficient documentation

## 2022-01-04 DIAGNOSIS — Z87891 Personal history of nicotine dependence: Secondary | ICD-10-CM | POA: Insufficient documentation

## 2022-01-04 DIAGNOSIS — M1990 Unspecified osteoarthritis, unspecified site: Secondary | ICD-10-CM | POA: Diagnosis not present

## 2022-01-04 DIAGNOSIS — Z951 Presence of aortocoronary bypass graft: Secondary | ICD-10-CM | POA: Diagnosis not present

## 2022-01-04 DIAGNOSIS — Z8249 Family history of ischemic heart disease and other diseases of the circulatory system: Secondary | ICD-10-CM | POA: Diagnosis not present

## 2022-01-04 DIAGNOSIS — K219 Gastro-esophageal reflux disease without esophagitis: Secondary | ICD-10-CM | POA: Insufficient documentation

## 2022-01-04 DIAGNOSIS — R1319 Other dysphagia: Secondary | ICD-10-CM

## 2022-01-04 DIAGNOSIS — Z8261 Family history of arthritis: Secondary | ICD-10-CM | POA: Diagnosis not present

## 2022-01-04 HISTORY — PX: ESOPHAGOGASTRODUODENOSCOPY (EGD) WITH PROPOFOL: SHX5813

## 2022-01-04 HISTORY — PX: BIOPSY: SHX5522

## 2022-01-04 HISTORY — PX: MALONEY DILATION: SHX5535

## 2022-01-04 SURGERY — ESOPHAGOGASTRODUODENOSCOPY (EGD) WITH PROPOFOL
Anesthesia: General

## 2022-01-04 MED ORDER — PHENYLEPHRINE HCL (PRESSORS) 10 MG/ML IV SOLN
INTRAVENOUS | Status: DC | PRN
  Administered 2022-01-04 (×5): 160 ug via INTRAVENOUS

## 2022-01-04 MED ORDER — PROPOFOL 10 MG/ML IV BOLUS
INTRAVENOUS | Status: DC | PRN
  Administered 2022-01-04: 20 mg via INTRAVENOUS
  Administered 2022-01-04: 50 mg via INTRAVENOUS
  Administered 2022-01-04: 20 mg via INTRAVENOUS
  Administered 2022-01-04: 30 mg via INTRAVENOUS

## 2022-01-04 MED ORDER — LACTATED RINGERS IV SOLN: INTRAVENOUS | Status: DC

## 2022-01-04 NOTE — Op Note (Signed)
[place] [facility] Patient Name: Chase Guerrero Procedure Date: 01/04/2022 8:37 AM MRN: [id number] Date of Birth: 08-22-1945 Attending MD: Norvel Richards , MD, [id number] CSN: [id number] Age: 76 Admit Type: Outpatient Procedure:                Upper GI endoscopy Indications:              Dysphagia Providers:                Norvel Richards, MD, Rosina Lowenstein, RN,                            Everardo Pacific Referring MD:              Medicines:                Propofol per Anesthesia Complications:            No immediate complications. Estimated Blood Loss:     Estimated blood loss was minimal. Procedure:                Pre-Anesthesia Assessment:                           - Prior to the procedure, a History and Physical                            was performed, and patient medications and                            allergies were reviewed. The patient's tolerance of                            previous anesthesia was also reviewed. The risks                            and benefits of the procedure and the sedation                            options and risks were discussed with the patient.                            All questions were answered, and informed consent                            was obtained. Prior Anticoagulants: The patient has                            taken no anticoagulant or antiplatelet agents. ASA                            Grade Assessment: II - A patient with mild systemic                            disease. After reviewing the risks and benefits,  the patient was deemed in satisfactory condition to                            undergo the procedure.                           After obtaining informed consent, the endoscope was                            passed under direct vision. Throughout the                            procedure, the patient's blood pressure, pulse, and                            oxygen saturations were  monitored continuously. The                            [id number]-H190 ([id number]) scope was introduced through the                            mouth, and advanced to the second part of duodenum.                            The upper GI endoscopy was accomplished without                            difficulty. The patient tolerated the procedure                            well. Scope In: 8:54:34 AM Scope Out: 9:05:49 AM Total Procedure Duration: 0 hours 11 minutes 15 seconds  Findings:      Widely patent tubular esophagus. "Tongue" of salmon-colored epithelium 2       cm above the EG junction. No esophagitis. No nodularity.      Stomach empty. The entire examined stomach was normal. Patent pylorus.      The duodenal bulb and second portion of the duodenum were normal. Scope       was withdrawn and a 56 French Maloney dilator was passed to full and       insertion with mild resistance. A look back revealed superficial tear       through the UES mucosa and at the GE junction. There was no apparent       complication. Minimal blood loss. Finally, biopsies of the abnormal       distal esophagus taken for histologic study. Impression:               - "Tongue" of salmon-colored epithelium distal                            esophagus. Status post esophageal dilation followed                            by biopsy                           -  Normal stomach.                           - Normal duodenal bulb and second portion of the                            duodenum. Moderate Sedation:      Moderate (conscious) sedation was personally administered by an       anesthesia professional. The following parameters were monitored: oxygen       saturation, heart rate, blood pressure, respiratory rate, EKG, adequacy       of pulmonary ventilation, and response to care. Recommendation:           - Patient has a contact number available for                            emergencies. The signs and symptoms of potential                             delayed complications were discussed with the                            patient. Return to normal activities tomorrow.                            Written discharge instructions were provided to the                            patient.                           - Advance diet as tolerated. Follow-up on                            pathology. Office visit with [place] in 3 months. Procedure Code(s):        --- Professional ---                           [phone], Esophagogastroduodenoscopy, flexible,                            transoral; diagnostic, including collection of                            specimen(s) by brushing or washing, when performed                            (separate procedure) Diagnosis Code(s):        --- Professional ---                           R13.10, Dysphagia, unspecified CPT copyright 2022 [employer] Association. All rights reserved. The codes documented in this report are preliminary and upon coder review may  be revised to meet current compliance requirements. Cristopher Estimable. Anael Rosch, MD Norvel Richards, MD 01/04/2022 9:19:01 AM This report has been signed electronically. Number of Addenda: 0

## 2022-01-04 NOTE — H&P (Signed)
$'@LOGO'u$ @   Primary Care Physician:  [name], MD Primary Gastroenterologist:  Dr. Gala Romney  Pre-Procedure History & Physical: HPI:  Chase Guerrero is a 76 y.o. male here for  further evaluate for esophageal  dysphagia in a background of longstanding GERD.  History of atrial fibrillation but not anticoagulated.  Past Medical History:  Diagnosis Date   Arthritis    Atrial fibrillation ([place])    Cancer (HCC)    skin   Coronary artery disease    Enlarged prostate    GERD (gastroesophageal reflux disease)    Hypertension    Myocardial infarction (HCC)    mild   Pleural effusion    Sleep apnea    not wearing  cpap  not worn in 9-10 yrs    Past Surgical History:  Procedure Laterality Date   CHEST TUBE INSERTION Left 08/16/2017   Procedure: INSERTION PLEURAL DRAINAGE CATHETER;  Surgeon: Ivin Poot, MD;  Location: [place];  Service: Thoracic;  Laterality: Left;   COLONOSCOPY N/A 07/23/2015   Procedure: COLONOSCOPY;  Surgeon: Daneil Dolin, MD;  [facility];  Service: Endoscopy;  Laterality: N/A;  10:30 Am   COLONOSCOPY N/A 08/27/2020   Procedure: COLONOSCOPY;  Surgeon: Daneil Dolin, MD;  [facility];  Service: Endoscopy;  Laterality: N/A;  10:30am   CORONARY ARTERY BYPASS GRAFT N/A 06/27/2017   Procedure: CORONARY ARTERY BYPASS GRAFTING (CABG) x 2  WITH ENDOSCOPIC HARVESTING OF RIGHT SAPHENOUS VEIN;  Surgeon: Ivin Poot, MD;  Location: [place];  Service: Open Heart Surgery;  Laterality: N/A;   IR THORACENTESIS ASP PLEURAL SPACE W/IMG GUIDE  08/08/2017   LEFT HEART CATH AND CORONARY ANGIOGRAPHY N/A 06/14/2017   Procedure: LEFT HEART CATH AND CORONARY ANGIOGRAPHY;  Surgeon: Leonie Man, MD;  Location: [place] [facility];  Service: Cardiovascular;  Laterality: N/A;   POLYPECTOMY  08/27/2020   Procedure: POLYPECTOMY;  Surgeon: Daneil Dolin, MD;  [facility];  Service: Endoscopy;;  cecal x2   REMOVAL OF PLEURAL DRAINAGE CATHETER Left 10/13/2017    Procedure: REMOVAL OF PLEURAL DRAINAGE CATHETER;  Surgeon: Ivin Poot, MD;  Location: [facility];  Service: Thoracic;  Laterality: Left;   Right hand surgery     Right knee arthroscopy     TEE WITHOUT CARDIOVERSION N/A 06/27/2017   Procedure: TRANSESOPHAGEAL ECHOCARDIOGRAM (TEE);  Surgeon: Prescott Gum, Collier Salina, MD;  Location: [place];  Service: Open Heart Surgery;  Laterality: N/A;    Prior to Admission medications   Medication Sig Start Date End Date Taking? Authorizing Provider  acetaminophen (TYLENOL) 500 MG tablet Take 1,000 mg by mouth every 6 (six) hours as needed for moderate pain.   Yes [provider]  aspirin EC 81 MG tablet Take 81 mg by mouth in the morning.   Yes [provider]  Cannabidiol POWD Apply 1 application topically 3 (three) times daily as needed (pain.). CBD cream   Yes [provider]  Carboxymethylcellulose Sodium (THERATEARS) 0.25 % SOLN Place 1-2 drops into both eyes 3 (three) times daily as needed (dry/irritation eyes).   Yes [provider]  ezetimibe (ZETIA) 10 MG tablet Take 10 mg by mouth daily. 01/07/21  Yes [provider]  finasteride (PROSCAR) 5 MG tablet Take 5 mg by mouth in the morning.   Yes [provider]  fluticasone (FLONASE) 50 MCG/ACT nasal spray Place 1 spray into both nostrils daily as needed for allergies or rhinitis.   Yes [provider]  lisinopril (ZESTRIL) 2.5 MG tablet TAKE ONE TABLET (2.'5MG'$  TOTAL) BY MOUTH DAILY 08/03/21  Yes Branch, Alphonse Guild, MD  nitroGLYCERIN (NITROSTAT) 0.4 MG SL tablet Place 0.4 mg under the tongue every 5 (five) minutes x 3 doses as needed for chest pain. 05/31/17  Yes [provider]  rosuvastatin (CRESTOR) 40 MG tablet TAKE ONE TABLET ('40MG'$  TOTAL) BY MOUTH DAILY 03/23/21  Yes Branch, Alphonse Guild, MD  Soft Lens Products (SENSITIVE EYES SALINE) SOLN Place 1 drop into both eyes 3 (three) times daily.   Yes [provider]  solifenacin (VESICARE) 5  MG tablet Take 1 tablet (5 mg total) by mouth daily. 07/13/21 07/13/22 Yes Festus Aloe, MD    Allergies as of 11/26/2021   (No Known Allergies)    Family History  Problem Relation Age of Onset   Alzheimer's disease Mother    Arthritis Sister    Heart attack Brother    Hypercholesterolemia Sister    Liver disease Sister     Social History   Socioeconomic History   Marital status: Married    Spouse name: Not on file   Number of children: Not on file   Years of education: Not on file   Highest education level: Not on file  Occupational History   Occupation: Retired  Tobacco Use   Smoking status: Former    Packs/day: 0.50    Years: 30.00    Total pack years: 15.00    Types: Cigarettes    Quit date: 02/16/1988    Years since quitting: 33.9   Smokeless tobacco: Current    Types: Snuff   Tobacco comments:    USING NICORETTE GUM TO TRY AND STOP USING SMOKELESS TOBACCO  Vaping Use   Vaping Use: Never used  Substance and Sexual Activity   Alcohol use: No   Drug use: No   Sexual activity: Not Currently  Other Topics Concern   Not on file  Social History Narrative   Not on file   Social Determinants of Health   Financial Resource Strain: Not on file  Food Insecurity: Not on file  Transportation Needs: Not on file  Physical Activity: Not on file  Stress: Not on file  Social Connections: Not on file  Intimate Partner Violence: Not on file    Review of Systems: See HPI, otherwise negative ROS  Physical Exam: BP 116/64   Pulse 70   Temp 98.1 F (36.7 C) (Oral)   Resp 12   Ht '5\' 10"'$  (1.778 m)   Wt 83.9 kg   SpO2 99%   BMI 26.54 kg/m  General:   Alert,  Well-developed, well-nourished, pleasant and cooperative in NAD Neck:  Supple; no masses or thyromegaly. No significant cervical adenopathy. Lungs:  Clear throughout to auscultation.   No wheezes, crackles, or rhonchi. No acute distress. Heart:  Regular rate and rhythm; no murmurs, clicks, rubs,  or  gallops. Abdomen: Non-distended, normal bowel sounds.  Soft and nontender without appreciable mass or hepatosplenomegaly.  Pulses:  Normal pulses noted. Extremities:  Without clubbing or edema.  Impression/Plan:    76 year old gentleman with esophageal dysphagia in the setting of longstanding history of GERD but not some much recently.   I have offered the patient an EGD with esophageal dilation as feasible/ The risks, benefits, limitations, alternatives and imponderables have been reviewed with the patient. Potential for esophageal dilation, biopsy, etc. have also been reviewed.  Questions have been answered. All parties agreeable.     Notice: This  dictation was prepared with Dragon dictation along with smaller phrase technology. Any transcriptional errors that result from this process are unintentional and may not be corrected upon review.

## 2022-01-04 NOTE — Anesthesia Postprocedure Evaluation (Signed)
Anesthesia Post Note  Patient: Chase Guerrero  Procedure(s) Performed: ESOPHAGOGASTRODUODENOSCOPY (EGD) WITH PROPOFOL [place]  Patient location during evaluation: Phase II Anesthesia Type: General Level of consciousness: awake and alert and oriented Pain management: pain level controlled Vital Signs Assessment: post-procedure vital signs reviewed and stable Respiratory status: spontaneous breathing, nonlabored ventilation and respiratory function stable Cardiovascular status: blood pressure returned to baseline and stable Postop Assessment: no apparent nausea or vomiting Anesthetic complications: no  No notable events documented.   Last Vitals:  Vitals:   01/04/22 0911 01/04/22 0916  BP: (!) 80/39 95/69  Pulse: 77 81  Resp: 13 16  Temp:    SpO2: 98% 98%    Last Pain:  Vitals:   01/04/22 0916  TempSrc:   PainSc: 0-No pain                 Chase Guerrero Grisel [place]umenstock

## 2022-01-04 NOTE — Anesthesia Preprocedure Evaluation (Addendum)
Anesthesia Evaluation  Patient identified by MRN, date of birth, ID band Patient awake    Reviewed: Allergy & Precautions, H&P , NPO status , Patient's Chart, lab work & pertinent test results  Airway Mallampati: II  TM Distance: >3 FB Neck ROM: Full    Dental  (+) Dental Advisory Given, Missing   Pulmonary sleep apnea , former smoker Thoracentesis    Pulmonary exam normal breath sounds clear to auscultation       Cardiovascular Exercise Tolerance: Good hypertension, Pt. on medications + angina  + CAD, + Past MI and + CABG  Normal cardiovascular exam Rhythm:Regular Rate:Normal  Study Conclusions   - Left ventricle: The cavity size was normal. Wall thickness was    normal. Systolic function was normal. The estimated ejection    fraction was in the range of 60% to 65%. Wall motion was normal;    there were no regional wall motion abnormalities. Left    ventricular diastolic function parameters were normal. Doppler    parameters are consistent with indeterminate ventricular filling    pressure.  - Aortic valve: There was mild regurgitation.  - Mitral valve: There was mild regurgitation.  - Right ventricle: Systolic function appeared grossly normal (RV    poorly visualized, I doubt accuracy of TAPSE).  - Pericardium, extracardiac: There was a left pleural effusion.      Neuro/Psych negative neurological ROS  negative psych ROS   GI/Hepatic Neg liver ROS,GERD  Medicated and Controlled,,  Endo/Other  negative endocrine ROS    Renal/GU negative Renal ROS  negative genitourinary   Musculoskeletal  (+) Arthritis , Osteoarthritis,    Abdominal   Peds negative pediatric ROS (+)  Hematology negative hematology ROS (+)   Anesthesia Other Findings   Reproductive/Obstetrics negative OB ROS                             Anesthesia Physical Anesthesia Plan  ASA: 3  Anesthesia Plan: General    Post-op Pain Management: Minimal or no pain anticipated   Induction: Intravenous  PONV Risk Score and Plan: Propofol infusion  Airway Management Planned: Nasal Cannula and Natural Airway  Additional Equipment:   Intra-op Plan:   Post-operative Plan:   Informed Consent: I have reviewed the patients History and Physical, chart, labs and discussed the procedure including the risks, benefits and alternatives for the proposed anesthesia with the patient or authorized representative who has indicated his/her understanding and acceptance.     Dental advisory given  Plan Discussed with: CRNA and Surgeon  Anesthesia Plan Comments:        Anesthesia Quick Evaluation

## 2022-01-04 NOTE — Transfer of Care (Signed)
Immediate Anesthesia Transfer of Care Note  Patient: Chase Guerrero  Procedure(s) Performed: ESOPHAGOGASTRODUODENOSCOPY (EGD) WITH PROPOFOL [place]  Patient Location: Endoscopy Unit  Anesthesia Type:MAC  Level of Consciousness: drowsy  Airway & Oxygen Therapy: Patient Spontanous Breathing and Patient connected to nasal cannula oxygen  Post-op Assessment: Report given to RN and Post -op Vital signs reviewed and stable  Post vital signs: Reviewed and stable, BP soft.  Fluids opened and patient placed in trendelenburg.  BP improved to systolic 63O and patient waking up.  RNs will continue to monitor.  Last Vitals:  Vitals Value Taken Time  BP 74/37 01/04/22 0909  Temp 36.4 C 01/04/22 0909  Pulse 81 01/04/22 0909  Resp 13 01/04/22 0909  SpO2 97 % 01/04/22 0909    Last Pain:  Vitals:   01/04/22 0909  TempSrc: Axillary  PainSc:       Patients Stated Pain Goal: 7 (75/64/33 2951)  Complications: No notable events documented.

## 2022-01-04 NOTE — Discharge Instructions (Addendum)
EGD Discharge instructions Please read the instructions outlined below and refer to this sheet in the next few weeks. These discharge instructions provide you with general information on caring for yourself after you leave the hospital. Your doctor may also give you specific instructions. While your treatment has been planned according to the most current medical practices available, unavoidable complications occasionally occur. If you have any problems or questions after discharge, please call your doctor. ACTIVITY You may resume your regular activity but move at a slower pace for the next 24 hours.  Take frequent rest periods for the next 24 hours.  Walking will help expel (get rid of) the air and reduce the bloated feeling in your abdomen.  No driving for 24 hours (because of the anesthesia (medicine) used during the test).  You may shower.  Do not sign any important legal documents or operate any machinery for 24 hours (because of the anesthesia used during the test).  NUTRITION Drink plenty of fluids.  You may resume your normal diet.  Begin with a light meal and progress to your normal diet.  Avoid alcoholic beverages for 24 hours or as instructed by your caregiver.  MEDICATIONS You may resume your normal medications unless your caregiver tells you otherwise.  WHAT YOU CAN EXPECT TODAY You may experience abdominal discomfort such as a feeling of fullness or "gas" pains.  FOLLOW-UP Your doctor will discuss the results of your test with you.  SEEK IMMEDIATE MEDICAL ATTENTION IF ANY OF THE FOLLOWING OCCUR: Excessive nausea (feeling sick to your stomach) and/or vomiting.  Severe abdominal pain and distention (swelling).  Trouble swallowing.  Temperature over 101 F (37.8 C).  Rectal bleeding or vomiting of blood.      Your esophagus was stretched today.  Your esophagus was biopsied.    Further recommendations to follow pending review of lab report.  At patient request, I called  Hoyle Sauer at [phone] findings and recommendations  Office visit with [place] in 3 months    OFFICE WILL CONTACT YOU OR CHECK [place]

## 2022-01-05 LAB — SURGICAL PATHOLOGY

## 2022-01-06 ENCOUNTER — Encounter: Payer: Self-pay | Admitting: Internal Medicine

## 2022-01-08 ENCOUNTER — Encounter (HOSPITAL_COMMUNITY): Payer: Self-pay | Admitting: Internal Medicine

## 2022-01-19 DIAGNOSIS — S233XXA Sprain of ligaments of thoracic spine, initial encounter: Secondary | ICD-10-CM | POA: Diagnosis not present

## 2022-01-19 DIAGNOSIS — S338XXA Sprain of other parts of lumbar spine and pelvis, initial encounter: Secondary | ICD-10-CM | POA: Diagnosis not present

## 2022-01-19 DIAGNOSIS — M9902 Segmental and somatic dysfunction of thoracic region: Secondary | ICD-10-CM | POA: Diagnosis not present

## 2022-01-19 DIAGNOSIS — S134XXA Sprain of ligaments of cervical spine, initial encounter: Secondary | ICD-10-CM | POA: Diagnosis not present

## 2022-01-19 DIAGNOSIS — M9903 Segmental and somatic dysfunction of lumbar region: Secondary | ICD-10-CM | POA: Diagnosis not present

## 2022-01-19 DIAGNOSIS — M9901 Segmental and somatic dysfunction of cervical region: Secondary | ICD-10-CM | POA: Diagnosis not present

## 2022-01-22 ENCOUNTER — Ambulatory Visit (INDEPENDENT_AMBULATORY_CARE_PROVIDER_SITE_OTHER): Payer: PPO | Admitting: Urology

## 2022-01-22 VITALS — BP 119/70 | HR 98

## 2022-01-22 DIAGNOSIS — R310 Gross hematuria: Secondary | ICD-10-CM

## 2022-01-22 DIAGNOSIS — R109 Unspecified abdominal pain: Secondary | ICD-10-CM

## 2022-01-22 LAB — MICROSCOPIC EXAMINATION
Bacteria, UA: NONE SEEN
RBC, Urine: >30 /hpf — AB (ref 0–2)
WBC, UA: NONE SEEN /hpf (ref 0–5)

## 2022-01-22 LAB — URINALYSIS, ROUTINE W REFLEX MICROSCOPIC
Bilirubin, UA: NEGATIVE
Glucose, UA: NEGATIVE
Ketones, UA: NEGATIVE
Leukocytes,UA: NEGATIVE
Nitrite, UA: NEGATIVE
Specific Gravity, UA: 1.025 (ref 1.005–1.030)
Urobilinogen, Ur: 0.2 mg/dL (ref 0.2–1.0)
pH, UA: 5.5 (ref 5.0–7.5)

## 2022-01-25 LAB — URINE CULTURE: Organism ID, Bacteria: NO GROWTH

## 2022-01-28 NOTE — Progress Notes (Signed)
Patient scheduled for CT stone study

## 2022-02-09 ENCOUNTER — Ambulatory Visit (HOSPITAL_COMMUNITY)
Admission: RE | Admit: 2022-02-09 | Discharge: 2022-02-09 | Disposition: A | Discharge status: Home / Self Care | Payer: PPO | Source: Ambulatory Visit | Attending: Urology | Admitting: Urology

## 2022-02-09 DIAGNOSIS — N400 Enlarged prostate without lower urinary tract symptoms: Secondary | ICD-10-CM | POA: Diagnosis not present

## 2022-02-09 DIAGNOSIS — R310 Gross hematuria: Secondary | ICD-10-CM | POA: Diagnosis not present

## 2022-02-09 DIAGNOSIS — R109 Unspecified abdominal pain: Secondary | ICD-10-CM | POA: Diagnosis not present

## 2022-02-09 DIAGNOSIS — K5730 Diverticulosis of large intestine without perforation or abscess without bleeding: Secondary | ICD-10-CM | POA: Diagnosis not present

## 2022-02-09 DIAGNOSIS — M4317 Spondylolisthesis, lumbosacral region: Secondary | ICD-10-CM | POA: Diagnosis not present

## 2022-02-09 DIAGNOSIS — R319 Hematuria, unspecified: Secondary | ICD-10-CM | POA: Diagnosis not present

## 2022-02-16 DIAGNOSIS — S338XXA Sprain of other parts of lumbar spine and pelvis, initial encounter: Secondary | ICD-10-CM | POA: Diagnosis not present

## 2022-02-16 DIAGNOSIS — M9903 Segmental and somatic dysfunction of lumbar region: Secondary | ICD-10-CM | POA: Diagnosis not present

## 2022-02-16 DIAGNOSIS — M9901 Segmental and somatic dysfunction of cervical region: Secondary | ICD-10-CM | POA: Diagnosis not present

## 2022-02-16 DIAGNOSIS — M9902 Segmental and somatic dysfunction of thoracic region: Secondary | ICD-10-CM | POA: Diagnosis not present

## 2022-02-16 DIAGNOSIS — S233XXA Sprain of ligaments of thoracic spine, initial encounter: Secondary | ICD-10-CM | POA: Diagnosis not present

## 2022-02-16 DIAGNOSIS — S134XXA Sprain of ligaments of cervical spine, initial encounter: Secondary | ICD-10-CM | POA: Diagnosis not present

## 2022-02-24 DIAGNOSIS — N1831 Chronic kidney disease, stage 3a: Secondary | ICD-10-CM | POA: Diagnosis not present

## 2022-02-24 DIAGNOSIS — I2510 Atherosclerotic heart disease of native coronary artery without angina pectoris: Secondary | ICD-10-CM | POA: Diagnosis not present

## 2022-02-24 DIAGNOSIS — Z79899 Other long term (current) drug therapy: Secondary | ICD-10-CM | POA: Diagnosis not present

## 2022-02-24 DIAGNOSIS — E785 Hyperlipidemia, unspecified: Secondary | ICD-10-CM | POA: Diagnosis not present

## 2022-02-24 DIAGNOSIS — R7303 Prediabetes: Secondary | ICD-10-CM | POA: Diagnosis not present

## 2022-03-02 DIAGNOSIS — N183 Chronic kidney disease, stage 3 unspecified: Secondary | ICD-10-CM | POA: Diagnosis not present

## 2022-03-02 DIAGNOSIS — G9601 Cranial cerebrospinal fluid leak, spontaneous: Secondary | ICD-10-CM | POA: Diagnosis not present

## 2022-03-02 DIAGNOSIS — N400 Enlarged prostate without lower urinary tract symptoms: Secondary | ICD-10-CM | POA: Diagnosis not present

## 2022-03-08 ENCOUNTER — Other Ambulatory Visit (HOSPITAL_COMMUNITY): Payer: PPO

## 2022-03-15 ENCOUNTER — Encounter (HOSPITAL_COMMUNITY): Payer: Self-pay | Admitting: Internal Medicine

## 2022-03-16 DIAGNOSIS — M9901 Segmental and somatic dysfunction of cervical region: Secondary | ICD-10-CM | POA: Diagnosis not present

## 2022-03-16 DIAGNOSIS — S233XXA Sprain of ligaments of thoracic spine, initial encounter: Secondary | ICD-10-CM | POA: Diagnosis not present

## 2022-03-16 DIAGNOSIS — M9902 Segmental and somatic dysfunction of thoracic region: Secondary | ICD-10-CM | POA: Diagnosis not present

## 2022-03-16 DIAGNOSIS — S338XXA Sprain of other parts of lumbar spine and pelvis, initial encounter: Secondary | ICD-10-CM | POA: Diagnosis not present

## 2022-03-16 DIAGNOSIS — S134XXA Sprain of ligaments of cervical spine, initial encounter: Secondary | ICD-10-CM | POA: Diagnosis not present

## 2022-03-16 DIAGNOSIS — M9903 Segmental and somatic dysfunction of lumbar region: Secondary | ICD-10-CM | POA: Diagnosis not present

## 2022-03-29 ENCOUNTER — Other Ambulatory Visit: Payer: Self-pay | Admitting: Cardiology

## 2022-04-06 NOTE — Progress Notes (Unsigned)
GI Office Note    Referring Provider: [name], MD Primary Care Physician:  [name], MD  Primary Gastroenterologist: Garfield Cornea, MD   Chief Complaint   No chief complaint on file.   History of Present Illness   Chase Guerrero is a 77 y.o. male presenting today for follow-up.  Last seen in the office back in October 2023 for dysphagia.      CT renal stone study January 2024:  IMPRESSION: 1. No findings to account for the patient's history of hematuria. Specifically, no urinary tract calculi no findings of urinary tract obstruction are noted at this time. 2. There are 2 small low-attenuation lesions in the interpolar regions of the kidneys. These are incompletely characterized on today's noncontrast CT examination, but statistically likely to represent small cysts. Should the patient's hematuria persist or worsen, further evaluation with nonemergent abdominal MRI with and without IV gadolinium would be recommended to provide definitive characterization in the setting of hematuria. 3. Colonic diverticulosis without evidence of acute diverticulitis at this time. 4. Aortic atherosclerosis. 5. Additional incidental findings, as above.  EGD November 2023: -Tongues of salmon-colored epithelium distal esophagus status post esophageal dilation followed by biopsy, biopsy showing gastroesophageal mucosa with mild reflux changes. -Normal stomach -Normal duodenal bulb and second portion of duodenum.     Medications   Current Outpatient Medications  Medication Sig Dispense Refill   acetaminophen (TYLENOL) 500 MG tablet Take 1,000 mg by mouth every 6 (six) hours as needed for moderate pain.     aspirin EC 81 MG tablet Take 81 mg by mouth in the morning.     Cannabidiol POWD Apply 1 application topically 3 (three) times daily as needed (pain.). CBD cream     Carboxymethylcellulose Sodium (THERATEARS) 0.25 % SOLN Place 1-2 drops into both eyes 3 (three) times daily  as needed (dry/irritation eyes).     ezetimibe (ZETIA) 10 MG tablet Take 10 mg by mouth daily.     finasteride (PROSCAR) 5 MG tablet Take 5 mg by mouth in the morning.     fluticasone (FLONASE) 50 MCG/ACT nasal spray Place 1 spray into both nostrils daily as needed for allergies or rhinitis.     lisinopril (ZESTRIL) 2.5 MG tablet TAKE ONE TABLET (2.'5MG'$  TOTAL) BY MOUTH DAILY 30 tablet 6   nitroGLYCERIN (NITROSTAT) 0.4 MG SL tablet Place 0.4 mg under the tongue every 5 (five) minutes x 3 doses as needed for chest pain.     rosuvastatin (CRESTOR) 40 MG tablet TAKE ONE TABLET ('40MG'$  TOTAL) BY MOUTH DAILY 30 tablet 0   Soft Lens Products (SENSITIVE EYES SALINE) SOLN Place 1 drop into both eyes 3 (three) times daily.     solifenacin (VESICARE) 5 MG tablet Take 1 tablet (5 mg total) by mouth daily. 30 tablet 2   No current facility-administered medications for this visit.    Allergies   Allergies as of 04/07/2022   (No Known Allergies)     Past Medical History   Past Medical History:  Diagnosis Date   Arthritis    Atrial fibrillation (HCC)    Cancer (HCC)    skin   Coronary artery disease    Enlarged prostate    GERD (gastroesophageal reflux disease)    Hypertension    Myocardial infarction (HCC)    mild   Pleural effusion    Sleep apnea    not wearing  cpap  not worn in 9-10 yrs    Past Surgical History  Past Surgical History:  Procedure Laterality Date   BIOPSY  01/04/2022   Procedure: BIOPSY;  Surgeon: Daneil Dolin, MD;  [facility];  Service: Endoscopy;;   CHEST TUBE INSERTION Left 08/16/2017   Procedure: INSERTION PLEURAL DRAINAGE CATHETER;  Surgeon: Ivin Poot, MD;  Location: [place];  Service: Thoracic;  Laterality: Left;   COLONOSCOPY N/A 07/23/2015   Procedure: COLONOSCOPY;  Surgeon: Daneil Dolin, MD;  [facility];  Service: Endoscopy;  Laterality: N/A;  10:30 Am   COLONOSCOPY N/A 08/27/2020   Procedure: COLONOSCOPY;  Surgeon: Daneil Dolin, MD;  [facility];  Service: Endoscopy;  Laterality: N/A;  10:30am   CORONARY ARTERY BYPASS GRAFT N/A 06/27/2017   Procedure: CORONARY ARTERY BYPASS GRAFTING (CABG) x 2  WITH ENDOSCOPIC HARVESTING OF RIGHT SAPHENOUS VEIN;  Surgeon: Ivin Poot, MD;  Location: [place];  Service: Open Heart Surgery;  Laterality: N/A;   ESOPHAGOGASTRODUODENOSCOPY (EGD) WITH PROPOFOL N/A 01/04/2022   Procedure: ESOPHAGOGASTRODUODENOSCOPY (EGD) WITH PROPOFOL;  Surgeon: Daneil Dolin, MD;  [facility];  Service: Endoscopy;  Laterality: N/A;  12:15 PM, pt knows to arrive at 7:15   IR THORACENTESIS ASP PLEURAL SPACE W/IMG GUIDE  08/08/2017   LEFT HEART CATH AND CORONARY ANGIOGRAPHY N/A 06/14/2017   Procedure: LEFT HEART CATH AND CORONARY ANGIOGRAPHY;  Surgeon: Leonie Man, MD;  Location: [place] [facility];  Service: Cardiovascular;  Laterality: N/A;   MALONEY DILATION N/A 01/04/2022   Procedure: Venia Minks DILATION;  Surgeon: Daneil Dolin, MD;  [facility];  Service: Endoscopy;  Laterality: N/A;   POLYPECTOMY  08/27/2020   Procedure: POLYPECTOMY;  Surgeon: Daneil Dolin, MD;  [facility];  Service: Endoscopy;;  cecal x2   REMOVAL OF PLEURAL DRAINAGE CATHETER Left 10/13/2017   Procedure: REMOVAL OF PLEURAL DRAINAGE CATHETER;  Surgeon: Ivin Poot, MD;  Location: [facility];  Service: Thoracic;  Laterality: Left;   Right hand surgery     Right knee arthroscopy     TEE WITHOUT CARDIOVERSION N/A 06/27/2017   Procedure: TRANSESOPHAGEAL ECHOCARDIOGRAM (TEE);  Surgeon: Prescott Gum, Collier Salina, MD;  Location: [place];  Service: Open Heart Surgery;  Laterality: N/A;    Past Family History   Family History  Problem Relation Age of Onset   Alzheimer's disease Mother    Arthritis Sister    Heart attack Brother    Hypercholesterolemia Sister    Liver disease Sister     Past Social History   Social History   Socioeconomic History   Marital status: Married    Spouse  name: Not on file   Number of children: Not on file   Years of education: Not on file   Highest education level: Not on file  Occupational History   Occupation: Retired  Tobacco Use   Smoking status: Former    Packs/day: 0.50    Years: 30.00    Total pack years: 15.00    Types: Cigarettes    Quit date: 02/16/1988    Years since quitting: 34.1   Smokeless tobacco: Current    Types: Snuff   Tobacco comments:    USING NICORETTE GUM TO TRY AND STOP USING SMOKELESS TOBACCO  Vaping Use   Vaping Use: Never used  Substance and Sexual Activity   Alcohol use: No   Drug use: No   Sexual activity: Not Currently  Other Topics Concern   Not on file  Social History Narrative  Not on file   Social Determinants of Health   Financial Resource Strain: Not on file  Food Insecurity: Not on file  Transportation Needs: Not on file  Physical Activity: Not on file  Stress: Not on file  Social Connections: Not on file  Intimate Partner Violence: Not on file    Review of Systems   General: Negative for anorexia, weight loss, fever, chills, fatigue, weakness. ENT: Negative for hoarseness, difficulty swallowing , nasal congestion. CV: Negative for chest pain, angina, palpitations, dyspnea on exertion, peripheral edema.  Respiratory: Negative for dyspnea at rest, dyspnea on exertion, cough, sputum, wheezing.  GI: See history of present illness. GU:  Negative for dysuria, hematuria, urinary incontinence, urinary frequency, nocturnal urination.  Endo: Negative for unusual weight change.     Physical Exam   There were no vitals taken for this visit.   General: Well-nourished, well-developed in no acute distress.  Eyes: No icterus. Mouth: Oropharyngeal mucosa moist and pink , no lesions erythema or exudate. Lungs: Clear to auscultation bilaterally.  Heart: Regular rate and rhythm, no murmurs rubs or gallops.  Abdomen: Bowel sounds are normal, nontender, nondistended, no hepatosplenomegaly or  masses,  no abdominal bruits or hernia , no rebound or guarding.  Rectal: ***  Extremities: No lower extremity edema. No clubbing or deformities. Neuro: Alert and oriented x 4   Skin: Warm and dry, no jaundice.   Psych: Alert and cooperative, normal mood and affect.  Labs   *** Imaging Studies   No results found.  Assessment       PLAN   ***   Laureen Ochs. Bobby Rumpf, [place], [facility]

## 2022-04-07 ENCOUNTER — Encounter: Payer: Self-pay | Admitting: Gastroenterology

## 2022-04-07 ENCOUNTER — Ambulatory Visit (INDEPENDENT_AMBULATORY_CARE_PROVIDER_SITE_OTHER): Payer: PPO | Admitting: Gastroenterology

## 2022-04-07 VITALS — BP 102/66 | HR 90 | Temp 97.9°F | Ht 70.0 in | Wt 194.6 lb

## 2022-04-07 DIAGNOSIS — R1319 Other dysphagia: Secondary | ICD-10-CM | POA: Diagnosis not present

## 2022-04-07 NOTE — Patient Instructions (Signed)
Follow up as needed

## 2022-04-13 DIAGNOSIS — S233XXA Sprain of ligaments of thoracic spine, initial encounter: Secondary | ICD-10-CM | POA: Diagnosis not present

## 2022-04-13 DIAGNOSIS — M9902 Segmental and somatic dysfunction of thoracic region: Secondary | ICD-10-CM | POA: Diagnosis not present

## 2022-04-13 DIAGNOSIS — M9903 Segmental and somatic dysfunction of lumbar region: Secondary | ICD-10-CM | POA: Diagnosis not present

## 2022-04-13 DIAGNOSIS — S338XXA Sprain of other parts of lumbar spine and pelvis, initial encounter: Secondary | ICD-10-CM | POA: Diagnosis not present

## 2022-04-13 DIAGNOSIS — S134XXA Sprain of ligaments of cervical spine, initial encounter: Secondary | ICD-10-CM | POA: Diagnosis not present

## 2022-04-13 DIAGNOSIS — M9901 Segmental and somatic dysfunction of cervical region: Secondary | ICD-10-CM | POA: Diagnosis not present

## 2022-04-22 DIAGNOSIS — L570 Actinic keratosis: Secondary | ICD-10-CM | POA: Diagnosis not present

## 2022-05-04 ENCOUNTER — Other Ambulatory Visit: Payer: Self-pay | Admitting: Cardiology

## 2022-05-11 DIAGNOSIS — S338XXA Sprain of other parts of lumbar spine and pelvis, initial encounter: Secondary | ICD-10-CM | POA: Diagnosis not present

## 2022-05-11 DIAGNOSIS — M9903 Segmental and somatic dysfunction of lumbar region: Secondary | ICD-10-CM | POA: Diagnosis not present

## 2022-05-11 DIAGNOSIS — M9902 Segmental and somatic dysfunction of thoracic region: Secondary | ICD-10-CM | POA: Diagnosis not present

## 2022-05-11 DIAGNOSIS — M9901 Segmental and somatic dysfunction of cervical region: Secondary | ICD-10-CM | POA: Diagnosis not present

## 2022-05-11 DIAGNOSIS — S233XXA Sprain of ligaments of thoracic spine, initial encounter: Secondary | ICD-10-CM | POA: Diagnosis not present

## 2022-05-11 DIAGNOSIS — S134XXA Sprain of ligaments of cervical spine, initial encounter: Secondary | ICD-10-CM | POA: Diagnosis not present

## 2022-06-10 DIAGNOSIS — S134XXA Sprain of ligaments of cervical spine, initial encounter: Secondary | ICD-10-CM | POA: Diagnosis not present

## 2022-06-10 DIAGNOSIS — S338XXA Sprain of other parts of lumbar spine and pelvis, initial encounter: Secondary | ICD-10-CM | POA: Diagnosis not present

## 2022-06-10 DIAGNOSIS — S233XXA Sprain of ligaments of thoracic spine, initial encounter: Secondary | ICD-10-CM | POA: Diagnosis not present

## 2022-06-10 DIAGNOSIS — M9901 Segmental and somatic dysfunction of cervical region: Secondary | ICD-10-CM | POA: Diagnosis not present

## 2022-06-10 DIAGNOSIS — M9903 Segmental and somatic dysfunction of lumbar region: Secondary | ICD-10-CM | POA: Diagnosis not present

## 2022-06-10 DIAGNOSIS — M9902 Segmental and somatic dysfunction of thoracic region: Secondary | ICD-10-CM | POA: Diagnosis not present

## 2022-06-11 ENCOUNTER — Other Ambulatory Visit: Payer: Self-pay | Admitting: Cardiology

## 2022-07-01 DIAGNOSIS — Z125 Encounter for screening for malignant neoplasm of prostate: Secondary | ICD-10-CM | POA: Diagnosis not present

## 2022-07-01 DIAGNOSIS — E785 Hyperlipidemia, unspecified: Secondary | ICD-10-CM | POA: Diagnosis not present

## 2022-07-01 DIAGNOSIS — N1831 Chronic kidney disease, stage 3a: Secondary | ICD-10-CM | POA: Diagnosis not present

## 2022-07-01 DIAGNOSIS — I2510 Atherosclerotic heart disease of native coronary artery without angina pectoris: Secondary | ICD-10-CM | POA: Diagnosis not present

## 2022-07-01 DIAGNOSIS — Z79899 Other long term (current) drug therapy: Secondary | ICD-10-CM | POA: Diagnosis not present

## 2022-07-01 DIAGNOSIS — N400 Enlarged prostate without lower urinary tract symptoms: Secondary | ICD-10-CM | POA: Diagnosis not present

## 2022-07-08 DIAGNOSIS — Z0001 Encounter for general adult medical examination with abnormal findings: Secondary | ICD-10-CM | POA: Diagnosis not present

## 2022-07-08 DIAGNOSIS — N400 Enlarged prostate without lower urinary tract symptoms: Secondary | ICD-10-CM | POA: Diagnosis not present

## 2022-07-08 DIAGNOSIS — S338XXA Sprain of other parts of lumbar spine and pelvis, initial encounter: Secondary | ICD-10-CM | POA: Diagnosis not present

## 2022-07-08 DIAGNOSIS — M9903 Segmental and somatic dysfunction of lumbar region: Secondary | ICD-10-CM | POA: Diagnosis not present

## 2022-07-08 DIAGNOSIS — M9901 Segmental and somatic dysfunction of cervical region: Secondary | ICD-10-CM | POA: Diagnosis not present

## 2022-07-08 DIAGNOSIS — S233XXA Sprain of ligaments of thoracic spine, initial encounter: Secondary | ICD-10-CM | POA: Diagnosis not present

## 2022-07-08 DIAGNOSIS — M9902 Segmental and somatic dysfunction of thoracic region: Secondary | ICD-10-CM | POA: Diagnosis not present

## 2022-07-08 DIAGNOSIS — S134XXA Sprain of ligaments of cervical spine, initial encounter: Secondary | ICD-10-CM | POA: Diagnosis not present

## 2022-07-08 DIAGNOSIS — R7309 Other abnormal glucose: Secondary | ICD-10-CM | POA: Diagnosis not present

## 2022-07-08 DIAGNOSIS — I2510 Atherosclerotic heart disease of native coronary artery without angina pectoris: Secondary | ICD-10-CM | POA: Diagnosis not present

## 2022-08-05 DIAGNOSIS — S233XXA Sprain of ligaments of thoracic spine, initial encounter: Secondary | ICD-10-CM | POA: Diagnosis not present

## 2022-08-05 DIAGNOSIS — S338XXA Sprain of other parts of lumbar spine and pelvis, initial encounter: Secondary | ICD-10-CM | POA: Diagnosis not present

## 2022-08-05 DIAGNOSIS — M9903 Segmental and somatic dysfunction of lumbar region: Secondary | ICD-10-CM | POA: Diagnosis not present

## 2022-08-05 DIAGNOSIS — S134XXA Sprain of ligaments of cervical spine, initial encounter: Secondary | ICD-10-CM | POA: Diagnosis not present

## 2022-08-05 DIAGNOSIS — M9901 Segmental and somatic dysfunction of cervical region: Secondary | ICD-10-CM | POA: Diagnosis not present

## 2022-08-05 DIAGNOSIS — M9902 Segmental and somatic dysfunction of thoracic region: Secondary | ICD-10-CM | POA: Diagnosis not present

## 2022-08-23 ENCOUNTER — Ambulatory Visit: Payer: PPO | Attending: Cardiology | Admitting: Cardiology

## 2022-08-23 ENCOUNTER — Encounter: Payer: Self-pay | Admitting: Cardiology

## 2022-08-23 VITALS — BP 122/60 | HR 80 | Ht 70.0 in | Wt 189.4 lb

## 2022-08-23 DIAGNOSIS — E782 Mixed hyperlipidemia: Secondary | ICD-10-CM

## 2022-08-23 DIAGNOSIS — I2510 Atherosclerotic heart disease of native coronary artery without angina pectoris: Secondary | ICD-10-CM

## 2022-08-23 MED ORDER — NITROGLYCERIN 0.4 MG SL SUBL: 0.4000 mg | SUBLINGUAL_TABLET | SUBLINGUAL | 3 refills | Status: AC | PRN

## 2022-08-23 NOTE — Patient Instructions (Addendum)
Medication Instructions:  Nitroglycerin refilled today Continue all other medications.     Labwork: none  Testing/Procedures: none  Follow-Up: 6 months   Any Other Special Instructions Will Be Listed Below (If Applicable).   If you need a refill on your cardiac medications before your next appointment, please call your pharmacy.  

## 2022-08-23 NOTE — Progress Notes (Signed)
Clinical Summary Chase Guerrero is a 77 y.o.male seen today for follow up of the following medical problems.    1. CAD - seen in 05/2017 for chest pain - 05/2017 high risk nuclear stress test as reported below, referred for cath - 06/2017 cath distal LM to LAD 100%, LVEF 35-45% by LV gram, referred for CABG   - 06/27/17 CABG with LIMA-LAD, SVG-diag -07/2017 echo LVEF 60-65%.      - he is off metoprolol due to low bp's and orthostatic symptoms  - no chest pains, no SOB/DOE - compliant with meds   2. Hyperlipidemia   - labs followed by pcp - we had preivously changed lipitor to crestor, he is also on zetia.  - 09/2020 TC 127 TG 92 HDL 36 LDL 73 - reports labs with pcp earlier this week.    06/2022 TC 110 TG 104 HDL 35 LDL 55   3. Dizziness - infrequent symptoms.    Past Medical History:  Diagnosis Date   Arthritis    Atrial fibrillation (HCC)    Cancer (HCC)    skin   Coronary artery disease    Enlarged prostate    GERD (gastroesophageal reflux disease)    Hypertension    Myocardial infarction (HCC)    mild   Pleural effusion    Sleep apnea    not wearing  cpap  not worn in 9-10 yrs     No Known Allergies   Current Outpatient Medications  Medication Sig Dispense Refill   acetaminophen (TYLENOL) 500 MG tablet Take 1,000 mg by mouth every 6 (six) hours as needed for moderate pain.     aspirin EC 81 MG tablet Take 81 mg by mouth in the morning.     Cannabidiol POWD Apply 1 application topically 3 (three) times daily as needed (pain.). CBD cream     ezetimibe (ZETIA) 10 MG tablet Take 10 mg by mouth daily.     finasteride (PROSCAR) 5 MG tablet Take 5 mg by mouth in the morning.     fluticasone (FLONASE) 50 MCG/ACT nasal spray Place 1 spray into both nostrils daily as needed for allergies or rhinitis.     lisinopril (ZESTRIL) 2.5 MG tablet TAKE ONE TABLET (2.5MG  TOTAL) BY MOUTH DAILY 30 tablet 6   nitroGLYCERIN (NITROSTAT) 0.4 MG SL tablet Place 0.4 mg under the  tongue every 5 (five) minutes x 3 doses as needed for chest pain.     rosuvastatin (CRESTOR) 40 MG tablet TAKE ONE TABLET (40MG  TOTAL) BY MOUTH DAILY 30 tablet 3   Soft Lens Products (SENSITIVE EYES SALINE) SOLN Place 1 drop into both eyes 3 (three) times daily.     No current facility-administered medications for this visit.     Past Surgical History:  Procedure Laterality Date   BIOPSY  01/04/2022   Procedure: BIOPSY;  Surgeon: Corbin Ade, MD;  [facility];  Service: Endoscopy;;   CHEST TUBE INSERTION Left 08/16/2017   Procedure: INSERTION PLEURAL DRAINAGE CATHETER;  Surgeon: Kerin Perna, MD;  Location: [facility] OR;  Service: Thoracic;  Laterality: Left;   COLONOSCOPY N/A 07/23/2015   Procedure: COLONOSCOPY;  Surgeon: Corbin Ade, MD;  [facility];  Service: Endoscopy;  Laterality: N/A;  10:30 Am   COLONOSCOPY N/A 08/27/2020   Procedure: COLONOSCOPY;  Surgeon: Corbin Ade, MD;  [facility];  Service: Endoscopy;  Laterality: N/A;  10:30am   CORONARY ARTERY BYPASS GRAFT N/A 06/27/2017  Procedure: CORONARY ARTERY BYPASS GRAFTING (CABG) x 2  WITH ENDOSCOPIC HARVESTING OF RIGHT SAPHENOUS VEIN;  Surgeon: Kerin Perna, MD;  Location[facility] OR;  Service: Open Heart Surgery;  Laterality: N/A;   ESOPHAGOGASTRODUODENOSCOPY (EGD) WITH PROPOFOL N/A 01/04/2022   Procedure: ESOPHAGOGASTRODUODENOSCOPY (EGD) WITH PROPOFOL;  Surgeon: Corbin Ade, MD;  [facility];  Service: Endoscopy;  Laterality: N/A;  12:15 PM, pt knows to arrive at 7:15   IR THORACENTESIS ASP PLEURAL SPACE W/IMG GUIDE  08/08/2017   LEFT HEART CATH AND CORONARY ANGIOGRAPHY N/A 06/14/2017   Procedure: LEFT HEART CATH AND CORONARY ANGIOGRAPHY;  Surgeon: Marykay Lex, MD;  Location: [facility] INVASIVE CV LAB;  Service: Cardiovascular;  Laterality: N/A;   MALONEY DILATION N/A 01/04/2022   Procedure: Elease Hashimoto DILATION;  Surgeon: Corbin Ade, MD;  [facility];  Service:  Endoscopy;  Laterality: N/A;   POLYPECTOMY  08/27/2020   Procedure: POLYPECTOMY;  Surgeon: Corbin Ade, MD;  [facility];  Service: Endoscopy;;  cecal x2   REMOVAL OF PLEURAL DRAINAGE CATHETER Left 10/13/2017   Procedure: REMOVAL OF PLEURAL DRAINAGE CATHETER;  Surgeon: Kerin Perna, MD;  Location: [facility] OR;  Service: Thoracic;  Laterality: Left;   Right hand surgery     Right knee arthroscopy     TEE WITHOUT CARDIOVERSION N/A 06/27/2017   Procedure: TRANSESOPHAGEAL ECHOCARDIOGRAM (TEE);  Surgeon: Donata Clay, Theron Arista, MD;  Location: [facility] OR;  Service: Open Heart Surgery;  Laterality: N/A;     No Known Allergies    Family History  Problem Relation Age of Onset   Alzheimer's disease Mother    Arthritis Sister    Heart attack Brother    Hypercholesterolemia Sister    Liver disease Sister      Social History Chase Guerrero reports that he quit smoking about 34 years ago. His smoking use included cigarettes. He started smoking about 64 years ago. He has a 15 pack-year smoking history. His smokeless tobacco use includes snuff. Chase Guerrero reports no history of alcohol use.   Review of Systems CONSTITUTIONAL: No weight loss, fever, chills, weakness or fatigue.  HEENT: Eyes: No visual loss, blurred vision, double vision or yellow sclerae.No hearing loss, sneezing, congestion, runny nose or sore throat.  SKIN: No rash or itching.  CARDIOVASCULAR: per hpi RESPIRATORY: No shortness of breath, cough or sputum.  GASTROINTESTINAL: No anorexia, nausea, vomiting or diarrhea. No abdominal pain or blood.  GENITOURINARY: No burning on urination, no polyuria NEUROLOGICAL: No headache, dizziness, syncope, paralysis, ataxia, numbness or tingling in the extremities. No change in bowel or bladder control.  MUSCULOSKELETAL: No muscle, back pain, joint pain or stiffness.  LYMPHATICS: No enlarged nodes. No history of splenectomy.  PSYCHIATRIC: No history of depression or anxiety.  ENDOCRINOLOGIC:  No reports of sweating, cold or heat intolerance. No polyuria or polydipsia.  Marland Kitchen   Physical Examination Today's Vitals   08/23/22 1434  BP: 122/60  Pulse: 80  SpO2: 97%  Weight: 189 lb 6.4 oz (85.9 kg)  Height: 5\' 10"  (1.778 m)   Body mass index is 27.18 kg/m.  Gen: resting comfortably, no acute distress HEENT: no scleral icterus, pupils equal round and reactive, no palptable cervical adenopathy,  CV: RRR, no m/rg, no jvd Resp: Clear to auscultation bilaterally GI: abdomen is soft, non-tender, non-distended, normal bowel sounds, no hepatosplenomegaly MSK: extremities are warm, no edema.  Skin: warm, no rash Neuro:  no focal deficits Psych: appropriate affect   Diagnostic Studies  05/2017  nuclear stress Blood pressure demonstrated a hypertensive response to exercise. 1 mm horizontal ST segment depressions in leads II, III, and aVF in recovery with nonspecific horizontal ST segment depressions in leads V5 and V6. There was excessive artifact with stress which limits interpretation. Defect 1: There is a large defect of moderate severity present in the mid anterior, mid anteroseptal, mid inferoseptal, apical anterior, apical septal and apical inferior location. Findings consistent with a large degree of ischemia. This is a high risk study. Nuclear stress EF: 50%.     07/2017 echo Study Conclusions   - Left ventricle: The cavity size was normal. Wall thickness was   normal. Systolic function was normal. The estimated ejection   fraction was in the range of 60% to 65%. Wall motion was normal;   there were no regional wall motion abnormalities. Left   ventricular diastolic function parameters were normal. Doppler   parameters are consistent with indeterminate ventricular filling   pressure. - Aortic valve: There was mild regurgitation. - Mitral valve: There was mild regurgitation. - Right ventricle: Systolic function appeared grossly normal (RV   poorly visualized, I doubt  accuracy of TAPSE). - Pericardium, extracardiac: There was a left pleural effusion.         Assessment and Plan   1. CAD - medical therapy limited by prior orthostatic symptoms and low bp's - no recent symptoms, continue current meds   2. Hyperlipidemia -LDL is at goal,continue current meds   F/u 6 months     Antoine Poche, M.D.

## 2022-09-02 DIAGNOSIS — S134XXA Sprain of ligaments of cervical spine, initial encounter: Secondary | ICD-10-CM | POA: Diagnosis not present

## 2022-09-02 DIAGNOSIS — M9903 Segmental and somatic dysfunction of lumbar region: Secondary | ICD-10-CM | POA: Diagnosis not present

## 2022-09-02 DIAGNOSIS — S233XXA Sprain of ligaments of thoracic spine, initial encounter: Secondary | ICD-10-CM | POA: Diagnosis not present

## 2022-09-02 DIAGNOSIS — M9902 Segmental and somatic dysfunction of thoracic region: Secondary | ICD-10-CM | POA: Diagnosis not present

## 2022-09-02 DIAGNOSIS — S338XXA Sprain of other parts of lumbar spine and pelvis, initial encounter: Secondary | ICD-10-CM | POA: Diagnosis not present

## 2022-09-02 DIAGNOSIS — M9901 Segmental and somatic dysfunction of cervical region: Secondary | ICD-10-CM | POA: Diagnosis not present

## 2022-10-05 DIAGNOSIS — M9903 Segmental and somatic dysfunction of lumbar region: Secondary | ICD-10-CM | POA: Diagnosis not present

## 2022-10-05 DIAGNOSIS — M9902 Segmental and somatic dysfunction of thoracic region: Secondary | ICD-10-CM | POA: Diagnosis not present

## 2022-10-05 DIAGNOSIS — S134XXA Sprain of ligaments of cervical spine, initial encounter: Secondary | ICD-10-CM | POA: Diagnosis not present

## 2022-10-05 DIAGNOSIS — M9901 Segmental and somatic dysfunction of cervical region: Secondary | ICD-10-CM | POA: Diagnosis not present

## 2022-10-05 DIAGNOSIS — S233XXA Sprain of ligaments of thoracic spine, initial encounter: Secondary | ICD-10-CM | POA: Diagnosis not present

## 2022-10-05 DIAGNOSIS — S338XXA Sprain of other parts of lumbar spine and pelvis, initial encounter: Secondary | ICD-10-CM | POA: Diagnosis not present

## 2022-10-27 DIAGNOSIS — L570 Actinic keratosis: Secondary | ICD-10-CM | POA: Diagnosis not present

## 2022-11-02 DIAGNOSIS — S233XXA Sprain of ligaments of thoracic spine, initial encounter: Secondary | ICD-10-CM | POA: Diagnosis not present

## 2022-11-02 DIAGNOSIS — M9903 Segmental and somatic dysfunction of lumbar region: Secondary | ICD-10-CM | POA: Diagnosis not present

## 2022-11-02 DIAGNOSIS — M9902 Segmental and somatic dysfunction of thoracic region: Secondary | ICD-10-CM | POA: Diagnosis not present

## 2022-11-02 DIAGNOSIS — S338XXA Sprain of other parts of lumbar spine and pelvis, initial encounter: Secondary | ICD-10-CM | POA: Diagnosis not present

## 2022-11-02 DIAGNOSIS — M9901 Segmental and somatic dysfunction of cervical region: Secondary | ICD-10-CM | POA: Diagnosis not present

## 2022-11-02 DIAGNOSIS — S134XXA Sprain of ligaments of cervical spine, initial encounter: Secondary | ICD-10-CM | POA: Diagnosis not present

## 2022-11-16 ENCOUNTER — Other Ambulatory Visit: Payer: Self-pay | Admitting: Cardiology

## 2022-11-22 ENCOUNTER — Ambulatory Visit: Payer: PPO | Admitting: Urology

## 2022-11-22 ENCOUNTER — Encounter: Payer: Self-pay | Admitting: Urology

## 2022-11-22 VITALS — BP 102/65 | HR 86

## 2022-11-22 DIAGNOSIS — R350 Frequency of micturition: Secondary | ICD-10-CM | POA: Diagnosis not present

## 2022-11-22 DIAGNOSIS — R3915 Urgency of urination: Secondary | ICD-10-CM | POA: Diagnosis not present

## 2022-11-22 DIAGNOSIS — N3001 Acute cystitis with hematuria: Secondary | ICD-10-CM | POA: Diagnosis not present

## 2022-11-22 DIAGNOSIS — R310 Gross hematuria: Secondary | ICD-10-CM | POA: Diagnosis not present

## 2022-11-22 DIAGNOSIS — R300 Dysuria: Secondary | ICD-10-CM | POA: Diagnosis not present

## 2022-11-22 LAB — URINALYSIS, ROUTINE W REFLEX MICROSCOPIC
Bilirubin, UA: NEGATIVE
Glucose, UA: NEGATIVE
Ketones, UA: NEGATIVE
Nitrite, UA: POSITIVE — AB
Specific Gravity, UA: 1.01 (ref 1.005–1.030)
Urobilinogen, Ur: 0.2 mg/dL (ref 0.2–1.0)
pH, UA: 6 (ref 5.0–7.5)

## 2022-11-22 LAB — MICROSCOPIC EXAMINATION: RBC, Urine: >30 /[HPF] — AB (ref 0–2)

## 2022-11-22 MED ORDER — ALFUZOSIN HCL ER 10 MG PO TB24: 10.0000 mg | ORAL_TABLET | Freq: Every evening | ORAL | 11 refills | Status: AC

## 2022-11-22 MED ORDER — NITROFURANTOIN MONOHYD MACRO 100 MG PO CAPS: 100.0000 mg | ORAL_CAPSULE | Freq: Two times a day (BID) | ORAL | 0 refills | Status: DC

## 2022-11-22 NOTE — Progress Notes (Signed)
11/22/2022 2:17 PM   Chase Guerrero 03-26-1945 [id number]  Referring provider: Carylon Perches, MD [address] [place],  [place] [address]  Difficulty urinating   HPI: Chase Guerrero is a 77yo here for evaluation for difficulty urinating. For over 1 week he has noticed increased urinary urgency, frequency and dysuria. He has gross hematuria with clots yesterday. Urinary frequency is every 20 minutes. He has low back pain which has worsened. Urine stream is weak. IPSS 24 QOL 5 on finasteride. No fevers. UA concerning for infection.    PMH: Past Medical History:  Diagnosis Date   Arthritis    Atrial fibrillation (HCC)    Cancer (HCC)    skin   Coronary artery disease    Enlarged prostate    GERD (gastroesophageal reflux disease)    Hypertension    Myocardial infarction (HCC)    mild   Pleural effusion    Sleep apnea    not wearing  cpap  not worn in 9-10 yrs    Surgical History: Past Surgical History:  Procedure Laterality Date   BIOPSY  01/04/2022   Procedure: BIOPSY;  Surgeon: Corbin Ade, MD;  [facility];  Service: Endoscopy;;   CHEST TUBE INSERTION Left 08/16/2017   Procedure: INSERTION PLEURAL DRAINAGE CATHETER;  Surgeon: Kerin Perna, MD;  Location: [facility] OR;  Service: Thoracic;  Laterality: Left;   COLONOSCOPY N/A 07/23/2015   Procedure: COLONOSCOPY;  Surgeon: Corbin Ade, MD;  [facility];  Service: Endoscopy;  Laterality: N/A;  10:30 Am   COLONOSCOPY N/A 08/27/2020   Procedure: COLONOSCOPY;  Surgeon: Corbin Ade, MD;  [facility];  Service: Endoscopy;  Laterality: N/A;  10:30am   CORONARY ARTERY BYPASS GRAFT N/A 06/27/2017   Procedure: CORONARY ARTERY BYPASS GRAFTING (CABG) x 2  WITH ENDOSCOPIC HARVESTING OF RIGHT SAPHENOUS VEIN;  Surgeon: Kerin Perna, MD;  Location[facility] OR;  Service: Open Heart Surgery;  Laterality: N/A;   ESOPHAGOGASTRODUODENOSCOPY (EGD) WITH PROPOFOL N/A 01/04/2022   Procedure:  ESOPHAGOGASTRODUODENOSCOPY (EGD) WITH PROPOFOL;  Surgeon: Corbin Ade, MD;  [facility];  Service: Endoscopy;  Laterality: N/A;  12:15 PM, pt knows to arrive at 7:15   IR THORACENTESIS ASP PLEURAL SPACE W/IMG GUIDE  08/08/2017   LEFT HEART CATH AND CORONARY ANGIOGRAPHY N/A 06/14/2017   Procedure: LEFT HEART CATH AND CORONARY ANGIOGRAPHY;  Surgeon: Marykay Lex, MD;  Location: [facility] INVASIVE CV LAB;  Service: Cardiovascular;  Laterality: N/A;   MALONEY DILATION N/A 01/04/2022   Procedure: Elease Hashimoto DILATION;  Surgeon: Corbin Ade, MD;  [facility];  Service: Endoscopy;  Laterality: N/A;   POLYPECTOMY  08/27/2020   Procedure: POLYPECTOMY;  Surgeon: Corbin Ade, MD;  [facility];  Service: Endoscopy;;  cecal x2   REMOVAL OF PLEURAL DRAINAGE CATHETER Left 10/13/2017   Procedure: REMOVAL OF PLEURAL DRAINAGE CATHETER;  Surgeon: Kerin Perna, MD;  Location: [facility] OR;  Service: Thoracic;  Laterality: Left;   Right hand surgery     Right knee arthroscopy     TEE WITHOUT CARDIOVERSION N/A 06/27/2017   Procedure: TRANSESOPHAGEAL ECHOCARDIOGRAM (TEE);  Surgeon: Donata Clay, Theron Arista, MD;  Location: [facility] OR;  Service: Open Heart Surgery;  Laterality: N/A;    Home Medications:  Allergies as of 11/22/2022   No Known Allergies      Medication List        Accurate as of November 22, 2022  2:17 PM. If  you have any questions, ask your nurse or doctor.          acetaminophen 500 MG tablet Commonly known as: TYLENOL Take 1,000 mg by mouth every 6 (six) hours as needed for moderate pain.   aspirin EC 81 MG tablet Take 81 mg by mouth in the morning.   Cannabidiol Powd Apply 1 application topically 3 (three) times daily as needed (pain.). CBD cream   ezetimibe 10 MG tablet Commonly known as: ZETIA Take 10 mg by mouth daily.   finasteride 5 MG tablet Commonly known as: PROSCAR Take 5 mg by mouth in the morning.   fluticasone 50 MCG/ACT nasal spray Commonly  known as: FLONASE Place 1 spray into both nostrils daily as needed for allergies or rhinitis.   lisinopril 2.5 MG tablet Commonly known as: ZESTRIL TAKE ONE TABLET (2.5MG  TOTAL) BY MOUTH DAILY   nitroGLYCERIN 0.4 MG SL tablet Commonly known as: NITROSTAT Place 1 tablet (0.4 mg total) under the tongue every 5 (five) minutes x 3 doses as needed for chest pain.   rosuvastatin 40 MG tablet Commonly known as: CRESTOR TAKE ONE TABLET (40MG  TOTAL) BY MOUTH DAILY   Sensitive Eyes Saline Soln Place 1 drop into both eyes 3 (three) times daily.        Allergies: No Known Allergies  Family History: Family History  Problem Relation Age of Onset   Alzheimer's disease Mother    Arthritis Sister    Heart attack Brother    Hypercholesterolemia Sister    Liver disease Sister     Social History:  reports that he quit smoking about 34 years ago. His smoking use included cigarettes. He started smoking about 64 years ago. He has a 15 pack-year smoking history. He has quit using smokeless tobacco.  His smokeless tobacco use included snuff. He reports that he does not drink alcohol and does not use drugs.  ROS: All other review of systems were reviewed and are negative except what is noted above in HPI  Physical Exam: BP 102/65   Pulse 86   Constitutional:  Alert and oriented, No acute distress. HEENT: [place] AT, moist mucus membranes.  Trachea midline, no masses. Cardiovascular: No clubbing, cyanosis, or edema. Respiratory: Normal respiratory effort, no increased work of breathing. GI: Abdomen is soft, nontender, nondistended, no abdominal masses GU: No CVA tenderness.  Lymph: No cervical or inguinal lymphadenopathy. Skin: No rashes, bruises or suspicious lesions. Neurologic: Grossly intact, no focal deficits, moving all 4 extremities. Psychiatric: Normal mood and affect.  Laboratory Data: Lab Results  Component Value Date   WBC 7.9 08/16/2017   HGB 12.9 (L) 08/16/2017   HCT 43.8  08/16/2017   MCV 89.8 08/16/2017   PLT 348 08/16/2017    Lab Results  Component Value Date   CREATININE 1.39 (H) 09/20/2017    No results found for: "PSA"  No results found for: "TESTOSTERONE"  Lab Results  Component Value Date   HGBA1C 5.9 (H) 06/24/2017    Urinalysis    Component Value Date/Time   COLORURINE YELLOW 06/24/2017 0919   APPEARANCEUR Cloudy (A) 01/22/2022 0918   LABSPEC 1.013 06/24/2017 0919   PHURINE 5.0 06/24/2017 0919   GLUCOSEU Negative 01/22/2022 0918   HGBUR MODERATE (A) 06/24/2017 0919   BILIRUBINUR Negative 01/22/2022 0918   KETONESUR NEGATIVE 06/24/2017 0919   PROTEINUR 2+ (A) 01/22/2022 0918   PROTEINUR NEGATIVE 06/24/2017 0919   NITRITE Negative 01/22/2022 0918   NITRITE NEGATIVE 06/24/2017 0919   LEUKOCYTESUR Negative 01/22/2022 4098  Lab Results  Component Value Date   LABMICR See below: 01/22/2022   WBCUA None seen 01/22/2022   LABEPIT 0-10 01/22/2022   MUCUS Present 07/13/2021   BACTERIA None seen 01/22/2022    Pertinent Imaging: *** No results found for this or any previous visit.  No results found for this or any previous visit.  No results found for this or any previous visit.  No results found for this or any previous visit.  Results for orders placed during the hospital encounter of 03/12/21  US RENAL  Narrative CLINICAL DATA:  Chronic kidney disease.  EXAM: RENAL / URINARY TRACT ULTRASOUND COMPLETE  COMPARISON:  None.  FINDINGS: Right Kidney:  Renal measurements: 10.0 x 5.9 x 4.8 cm = volume: 147 mL. 1.8 cm cyst. Normal echotexture. No hydronephrosis.  Left Kidney:  Renal measurements: 10.0 x 5.3 x 4.8 cm = volume: 134 mL. Echogenicity within normal limits. No mass or hydronephrosis visualized.  Bladder:  Appears normal for degree of bladder distention.  Other:  Moderately to markedly enlarged prostate gland protruding into the base of the urinary bladder.  IMPRESSION: 1. Unremarkable  kidneys. 2. Moderate to marked prostatic hypertrophy.   Electronically Signed By: Beckie Salts M.D. On: 03/14/2021 15:48  No valid procedures specified. No results found for this or any previous visit.  Results for orders placed in visit on 01/22/22  CT RENAL STONE STUDY  Narrative CLINICAL DATA:  77 year old male with history of abdominal and flank pain. Hematuria.  EXAM: CT ABDOMEN AND PELVIS WITHOUT CONTRAST  TECHNIQUE: Multidetector CT imaging of the abdomen and pelvis was performed following the standard protocol without IV contrast.  RADIATION DOSE REDUCTION: This exam was performed according to the departmental dose-optimization program which includes automated exposure control, adjustment of the mA and/or kV according to patient size and/or use of iterative reconstruction technique.  COMPARISON:  No prior CT of the abdomen and pelvis.  FINDINGS: Lower chest: Atherosclerotic calcifications in the left anterior descending coronary artery and descending thoracic aorta.  Hepatobiliary: No definite suspicious cystic or solid hepatic lesions are confidently identified on today's noncontrast CT examination. Unenhanced appearance of the gallbladder is unremarkable.  Pancreas: No definite pancreatic mass or peripancreatic fluid collections or inflammatory changes are noted on today's noncontrast CT examination.  Spleen: Unremarkable.  Adrenals/Urinary Tract: No calcifications are identified within the collecting system of either kidney, along the course of either ureter, or within the lumen of the urinary bladder. No hydroureteronephrosis to suggest urinary tract obstruction. In the lateral aspect of the interpolar region of the right kidney there is a well-defined 1.8 cm low-attenuation lesion which is incompletely characterized on today's noncontrast CT examination, but statistically likely to represent a cyst. Subcentimeter low-attenuation lesion also noted  in the posterior aspect of the interpolar region of the left kidney, also likely a small cyst. Unenhanced appearance of the adrenal glands is normal. Urinary bladder demonstrates a small amount of intramural fat in the anterior/superior aspect of the bladder wall, but is otherwise grossly unremarkable in appearance.  Stomach/Bowel: Unenhanced appearance of the stomach is normal. No pathologic dilatation of small bowel or colon. A few scattered colonic diverticula are noted, without surrounding inflammatory changes to indicate an acute diverticulitis at this time. Normal appendix.  Vascular/Lymphatic: Atherosclerotic calcifications throughout the abdominal aorta and pelvic vasculature. No lymphadenopathy noted in the abdomen or pelvis.  Reproductive: Severe median lobe hypertrophy of the prostate gland. Seminal vesicles are unremarkable in appearance.  Other: No significant volume of ascites.  No pneumoperitoneum.  Musculoskeletal: Bilateral pars defects are noted at L5 with 8 mm of anterolisthesis of L5 upon S1. There are no aggressive appearing lytic or blastic lesions noted in the visualized portions of the skeleton.  IMPRESSION: 1. No findings to account for the patient's history of hematuria. Specifically, no urinary tract calculi no findings of urinary tract obstruction are noted at this time. 2. There are 2 small low-attenuation lesions in the interpolar regions of the kidneys. These are incompletely characterized on today's noncontrast CT examination, but statistically likely to represent small cysts. Should the patient's hematuria persist or worsen, further evaluation with nonemergent abdominal MRI with and without IV gadolinium would be recommended to provide definitive characterization in the setting of hematuria. 3. Colonic diverticulosis without evidence of acute diverticulitis at this time. 4. Aortic atherosclerosis. 5. Additional incidental findings, as  above.   Electronically Signed By: Trudie Reed M.D. On: 02/11/2022 07:25   Assessment & Plan:    1. Acute cystitis -urine for culture -macrobid 100mg  BID for 7 days - Urinalysis, Routine w reflex microscopic - BLADDER SCAN AMB NON-IMAGING  2. Urinary frequency -uroxatral 10mg  qhs - Urinalysis, Routine w reflex microscopic - BLADDER SCAN AMB NON-IMAGING   No follow-ups on file.  Wilkie Aye, MD  [facility] [place]

## 2022-11-22 NOTE — Progress Notes (Signed)
post void residual=0 ?

## 2022-11-22 NOTE — Patient Instructions (Signed)

## 2022-11-27 LAB — URINE CULTURE

## 2022-12-01 DIAGNOSIS — M9901 Segmental and somatic dysfunction of cervical region: Secondary | ICD-10-CM | POA: Diagnosis not present

## 2022-12-01 DIAGNOSIS — S134XXA Sprain of ligaments of cervical spine, initial encounter: Secondary | ICD-10-CM | POA: Diagnosis not present

## 2022-12-01 DIAGNOSIS — S338XXA Sprain of other parts of lumbar spine and pelvis, initial encounter: Secondary | ICD-10-CM | POA: Diagnosis not present

## 2022-12-01 DIAGNOSIS — M9902 Segmental and somatic dysfunction of thoracic region: Secondary | ICD-10-CM | POA: Diagnosis not present

## 2022-12-01 DIAGNOSIS — S233XXA Sprain of ligaments of thoracic spine, initial encounter: Secondary | ICD-10-CM | POA: Diagnosis not present

## 2022-12-01 DIAGNOSIS — M9903 Segmental and somatic dysfunction of lumbar region: Secondary | ICD-10-CM | POA: Diagnosis not present

## 2022-12-02 ENCOUNTER — Other Ambulatory Visit: Payer: Self-pay | Admitting: Cardiology

## 2022-12-13 ENCOUNTER — Telehealth: Payer: Self-pay

## 2022-12-13 NOTE — Telephone Encounter (Signed)
   Possible injured testicle. Dog stepped on testicle during play.  Swollen and bothersome.  Please advise.  Call:  [phone]

## 2022-12-16 DIAGNOSIS — Z720 Tobacco use: Secondary | ICD-10-CM | POA: Insufficient documentation

## 2022-12-16 DIAGNOSIS — I2510 Atherosclerotic heart disease of native coronary artery without angina pectoris: Secondary | ICD-10-CM | POA: Insufficient documentation

## 2022-12-16 DIAGNOSIS — H40013 Open angle with borderline findings, low risk, bilateral: Secondary | ICD-10-CM | POA: Insufficient documentation

## 2022-12-16 DIAGNOSIS — H40003 Preglaucoma, unspecified, bilateral: Secondary | ICD-10-CM | POA: Insufficient documentation

## 2022-12-16 DIAGNOSIS — H259 Unspecified age-related cataract: Secondary | ICD-10-CM | POA: Insufficient documentation

## 2022-12-16 DIAGNOSIS — N401 Enlarged prostate with lower urinary tract symptoms: Secondary | ICD-10-CM | POA: Insufficient documentation

## 2022-12-16 NOTE — Progress Notes (Signed)
Name: Chase Guerrero DOB: 1946/01/11 MRN: [id number]  History of Present Illness: Chase Guerrero is a 77 y.o. male who presents today for return visit at [facility] [place]. - GU history: 1. BPH with LUTS.  At last visit with Dr. Ronne Binning on 11/22/2022: - Seen for increased LUTS including difficulty urinating, increased urinary urgency, frequency, dysuria, gross hematuria with clots, low back pain, weak urine stream. - UA: 11-30 WBC/hpf, >30 RBC/hpf, bacteria (many). - The plan was: 1. Urine culture (positive for >100k Klebsiella aerogenes). 2. Macrobid 100 mg BID x7 days. 3. Start Uroxatral 10 mg nightly.  Today: He reports left scrotal pain for the past 2.5 weeks since his 65 pound dog jumped on him there. He denies scrotal redness, warmth, tenderness, or swelling. Denies fevers or any acute urinary symptoms. Denies prior history of scrotal trauma or surgical procedures in that area.    Fall Screening: Do you usually have a device to assist in your mobility? No   Medications: Current Outpatient Medications  Medication Sig Dispense Refill   acetaminophen (TYLENOL) 500 MG tablet Take 1,000 mg by mouth every 6 (six) hours as needed for moderate pain.     alfuzosin (UROXATRAL) 10 MG 24 hr tablet Take 1 tablet (10 mg total) by mouth at bedtime. 30 tablet 11   aspirin EC 81 MG tablet Take 81 mg by mouth in the morning.     Cannabidiol POWD Apply 1 application topically 3 (three) times daily as needed (pain.). CBD cream     ezetimibe (ZETIA) 10 MG tablet Take 10 mg by mouth daily.     finasteride (PROSCAR) 5 MG tablet Take 5 mg by mouth in the morning.     fluticasone (FLONASE) 50 MCG/ACT nasal spray Place 1 spray into both nostrils daily as needed for allergies or rhinitis.     lisinopril (ZESTRIL) 2.5 MG tablet TAKE ONE TABLET (2.5MG  TOTAL) BY MOUTH DAILY 30 tablet 6   nitrofurantoin, macrocrystal-monohydrate, (MACROBID) 100 MG capsule Take 1 capsule (100 mg total) by  mouth every 12 (twelve) hours. 14 capsule 0   nitroGLYCERIN (NITROSTAT) 0.4 MG SL tablet Place 1 tablet (0.4 mg total) under the tongue every 5 (five) minutes x 3 doses as needed for chest pain. 25 tablet 3   rosuvastatin (CRESTOR) 40 MG tablet TAKE ONE TABLET (40MG  TOTAL) BY MOUTH DAILY 30 tablet 3   Soft Lens Products (SENSITIVE EYES SALINE) SOLN Place 1 drop into both eyes 3 (three) times daily.     No current facility-administered medications for this visit.    Allergies: No Known Allergies  Past Medical History:  Diagnosis Date   Arthritis    Atrial fibrillation (HCC)    Cancer (HCC)    skin   Coronary artery disease    Enlarged prostate    GERD (gastroesophageal reflux disease)    Hypertension    Myocardial infarction (HCC)    mild   Pleural effusion    Sleep apnea    not wearing  cpap  not worn in 9-10 yrs   Past Surgical History:  Procedure Laterality Date   BIOPSY  01/04/2022   Procedure: BIOPSY;  Surgeon: Corbin Ade, MD;  [facility];  Service: Endoscopy;;   CHEST TUBE INSERTION Left 08/16/2017   Procedure: INSERTION PLEURAL DRAINAGE CATHETER;  Surgeon: Kerin Perna, MD;  Location: [facility] OR;  Service: Thoracic;  Laterality: Left;   COLONOSCOPY N/A 07/23/2015   Procedure: COLONOSCOPY;  Surgeon: Corbin Ade,  MD;  [facility];  Service: Endoscopy;  Laterality: N/A;  10:30 Am   COLONOSCOPY N/A 08/27/2020   Procedure: COLONOSCOPY;  Surgeon: Corbin Ade, MD;  [facility];  Service: Endoscopy;  Laterality: N/A;  10:30am   CORONARY ARTERY BYPASS GRAFT N/A 06/27/2017   Procedure: CORONARY ARTERY BYPASS GRAFTING (CABG) x 2  WITH ENDOSCOPIC HARVESTING OF RIGHT SAPHENOUS VEIN;  Surgeon: Kerin Perna, MD;  Location: [facility] OR;  Service: Open Heart Surgery;  Laterality: N/A;   ESOPHAGOGASTRODUODENOSCOPY (EGD) WITH PROPOFOL N/A 01/04/2022   Procedure: ESOPHAGOGASTRODUODENOSCOPY (EGD) WITH PROPOFOL;  Surgeon: Corbin Ade, MD;   [facility];  Service: Endoscopy;  Laterality: N/A;  12:15 PM, pt knows to arrive at 7:15   IR THORACENTESIS ASP PLEURAL SPACE W/IMG GUIDE  08/08/2017   LEFT HEART CATH AND CORONARY ANGIOGRAPHY N/A 06/14/2017   Procedure: LEFT HEART CATH AND CORONARY ANGIOGRAPHY;  Surgeon: Marykay Lex, MD;  Location: [facility] INVASIVE CV LAB;  Service: Cardiovascular;  Laterality: N/A;   MALONEY DILATION N/A 01/04/2022   Procedure: Elease Hashimoto DILATION;  Surgeon: Corbin Ade, MD;  [facility];  Service: Endoscopy;  Laterality: N/A;   POLYPECTOMY  08/27/2020   Procedure: POLYPECTOMY;  Surgeon: Corbin Ade, MD;  [facility];  Service: Endoscopy;;  cecal x2   REMOVAL OF PLEURAL DRAINAGE CATHETER Left 10/13/2017   Procedure: REMOVAL OF PLEURAL DRAINAGE CATHETER;  Surgeon: Kerin Perna, MD;  Location: [facility] OR;  Service: Thoracic;  Laterality: Left;   Right hand surgery     Right knee arthroscopy     TEE WITHOUT CARDIOVERSION N/A 06/27/2017   Procedure: TRANSESOPHAGEAL ECHOCARDIOGRAM (TEE);  Surgeon: Donata Clay, Theron Arista, MD;  Location: [facility] OR;  Service: Open Heart Surgery;  Laterality: N/A;   Family History  Problem Relation Age of Onset   Alzheimer's disease Mother    Arthritis Sister    Heart attack Brother    Hypercholesterolemia Sister    Liver disease Sister    Social History   Socioeconomic History   Marital status: Married    Spouse name: Not on file   Number of children: Not on file   Years of education: Not on file   Highest education level: Not on file  Occupational History   Occupation: Retired  Tobacco Use   Smoking status: Former    Current packs/day: 0.00    Average packs/day: 0.5 packs/day for 30.0 years (15.0 ttl pk-yrs)    Types: Cigarettes    Start date: 02/15/1958    Quit date: 02/16/1988    Years since quitting: 34.8   Smokeless tobacco: Former    Types: Snuff   Tobacco comments:    USING NICORETTE GUM TO TRY AND STOP USING SMOKELESS TOBACCO  Vaping  Use   Vaping status: Never Used  Substance and Sexual Activity   Alcohol use: No   Drug use: No   Sexual activity: Not Currently  Other Topics Concern   Not on file  Social History Narrative   Not on file   Social Determinants of Health   Financial Resource Strain: Not on file  Food Insecurity: Not on file  Transportation Needs: Not on file  Physical Activity: Not on file  Stress: Not on file  Social Connections: Not on file  Intimate Partner Violence: Not on file    Review of Systems Constitutional: Patient denies any unintentional weight loss or change in strength lntegumentary: Patient denies any rashes or pruritus  Cardiovascular: Patient denies chest pain or syncope Respiratory: Patient denies shortness of breath Gastrointestinal: Patient denies nausea, vomiting, constipation, or diarrhea Musculoskeletal: Patient denies muscle cramps or weakness Neurologic: Patient denies convulsions or seizures Allergic/Immunologic: Patient denies recent allergic reaction(s) Hematologic/Lymphatic: Patient denies bleeding tendencies Endocrine: Patient denies heat/cold intolerance  GU: As per HPI.  OBJECTIVE Vitals:   12/20/22 1155  BP: 113/70  Pulse: 83  Temp: 97.8 F (36.6 C)   There is no height or weight on file to calculate BMI.  Physical Examination Constitutional: No obvious distress; patient is non-toxic appearing  Cardiovascular: No visible lower extremity edema.  Respiratory: The patient does not have audible wheezing/stridor; respirations do not appear labored  Gastrointestinal: Abdomen non-distended Musculoskeletal: Normal ROM of UEs  Skin: No obvious rashes/open sores  Neurologic: CN 2-12 grossly intact Psychiatric: Answered questions appropriately with normal affect  Hematologic/Lymphatic/Immunologic: No obvious bruises or sites of spontaneous bleeding  Genitourinary: Penis is normal in appearance.  Right hemiscrotum is normal in appearance. Left  hemiscrotum is mildly edematous and tender to palpation at mid-to-distal aspect. No fluctuance, ecchymosis, erythema, warmth, rash, or lesions. Testes are normal in size and position bilaterally with no palpable masses.  Varicocele is not detected. Hydrocele is not detected. Epididymis normal without masses or tenderness.  Chaperone offered for pelvic exam; patient declined.  ASSESSMENT Scrotal trauma, initial encounter - Plan: US SCROTUM W/DOPPLER  Swelling of left half of scrotum - Plan: US SCROTUM W/DOPPLER  Scrotal pain - Plan: US SCROTUM W/DOPPLER  History of UTI - Plan: Urinalysis, Routine w reflex microscopic, BLADDER SCAN AMB NON-IMAGING  Benign prostatic hyperplasia with urinary frequency  No acute findings on exam; suspect small left scrotal hematoma or seroma secondary to trauma from dog jumping on him. Advised patient this will likely resolve in time without intervention; based on his level of concern however we agreed to obtain scrotal / testicular ultrasound for further evaluation. Advised rest, application of ice, analgesics PRN, scrotal support throughout the day with supportive underwear and/or jock strap, and scrotal elevation when at rest for comfort and to promote drainage. Will plan for follow up in 2 weeks for recheck. Pt verbalized understanding and agreement. All questions were answered.  PLAN Advised the following: 1. Scrotal / testicular ultrasound.  2. Rest / ice / compression /  elevation.  3. Return in about 2 weeks (around 01/03/2023) for UA, PVR, & f/u with Evette Georges NP.  Orders Placed This Encounter  Procedures   US SCROTUM W/DOPPLER    Standing Status:   Future    Standing Expiration Date:   12/20/2023    Order Specific Question:   Reason for Exam (SYMPTOM  OR DIAGNOSIS REQUIRED)    Answer:   left scrotal pain / edema    Order Specific Question:   Preferred imaging location?    Answer:   [facility]   Urinalysis, Routine w reflex  microscopic   BLADDER SCAN AMB NON-IMAGING    It has been explained that the patient is to follow regularly with their PCP in addition to all other providers involved in their care and to follow instructions provided by these respective offices. Patient advised to contact urology clinic if any urologic-pertaining questions, concerns, new symptoms or problems arise in the interim period.  There are no Patient Instructions on file for this visit.  Electronically signed by:  Donnita Falls, FNP   12/20/22    12:23 PM

## 2022-12-20 ENCOUNTER — Ambulatory Visit: Payer: PPO | Admitting: Urology

## 2022-12-20 ENCOUNTER — Encounter: Payer: Self-pay | Admitting: Urology

## 2022-12-20 VITALS — BP 113/70 | HR 83 | Temp 97.8°F

## 2022-12-20 DIAGNOSIS — R350 Frequency of micturition: Secondary | ICD-10-CM

## 2022-12-20 DIAGNOSIS — S3994XA Unspecified injury of external genitals, initial encounter: Secondary | ICD-10-CM | POA: Diagnosis not present

## 2022-12-20 DIAGNOSIS — N401 Enlarged prostate with lower urinary tract symptoms: Secondary | ICD-10-CM

## 2022-12-20 DIAGNOSIS — N5082 Scrotal pain: Secondary | ICD-10-CM | POA: Diagnosis not present

## 2022-12-20 DIAGNOSIS — Z87440 Personal history of urinary (tract) infections: Secondary | ICD-10-CM

## 2022-12-20 DIAGNOSIS — N5089 Other specified disorders of the male genital organs: Secondary | ICD-10-CM

## 2022-12-20 LAB — URINALYSIS, ROUTINE W REFLEX MICROSCOPIC
Bilirubin, UA: NEGATIVE
Glucose, UA: NEGATIVE
Ketones, UA: NEGATIVE
Nitrite, UA: NEGATIVE
Specific Gravity, UA: 1.025 (ref 1.005–1.030)
Urobilinogen, Ur: 0.2 mg/dL (ref 0.2–1.0)
pH, UA: 6 (ref 5.0–7.5)

## 2022-12-20 LAB — MICROSCOPIC EXAMINATION
RBC, Urine: >30 /[HPF] — AB (ref 0–2)
WBC, UA: >30 /[HPF] — AB (ref 0–5)

## 2022-12-20 LAB — BLADDER SCAN AMB NON-IMAGING: Scan Result: 0

## 2022-12-20 NOTE — Addendum Note (Signed)
Addended by: Grier Rocher on: 12/20/2022 01:04 PM   Modules accepted: Orders

## 2022-12-20 NOTE — Progress Notes (Signed)
post void residual=0 ?

## 2022-12-23 ENCOUNTER — Other Ambulatory Visit: Payer: Self-pay | Admitting: Urology

## 2022-12-23 DIAGNOSIS — N390 Urinary tract infection, site not specified: Secondary | ICD-10-CM

## 2022-12-23 DIAGNOSIS — N3001 Acute cystitis with hematuria: Secondary | ICD-10-CM

## 2022-12-23 LAB — URINE CULTURE

## 2022-12-23 MED ORDER — SULFAMETHOXAZOLE-TRIMETHOPRIM 800-160 MG PO TABS: 1.0000 | ORAL_TABLET | Freq: Two times a day (BID) | ORAL | 0 refills | Status: DC

## 2022-12-24 ENCOUNTER — Ambulatory Visit (HOSPITAL_COMMUNITY)
Admission: RE | Admit: 2022-12-24 | Discharge: 2022-12-24 | Disposition: A | Discharge status: Home / Self Care | Payer: PPO | Source: Ambulatory Visit | Attending: Urology | Admitting: Urology

## 2022-12-24 DIAGNOSIS — N5082 Scrotal pain: Secondary | ICD-10-CM

## 2022-12-24 DIAGNOSIS — N433 Hydrocele, unspecified: Secondary | ICD-10-CM | POA: Diagnosis not present

## 2022-12-24 DIAGNOSIS — N5089 Other specified disorders of the male genital organs: Secondary | ICD-10-CM

## 2022-12-24 DIAGNOSIS — S3994XA Unspecified injury of external genitals, initial encounter: Secondary | ICD-10-CM

## 2022-12-27 NOTE — Progress Notes (Signed)
Please let patient know scrotal / testicular ultrasound showed no acute findings. He has moderate bilateral hydroceles. No mass, hematoma, seroma, etc.

## 2022-12-30 NOTE — Progress Notes (Signed)
Name: Chase Guerrero DOB: [birth year] MRN: [id number]  History of Present Illness: Mr. Personius is a 77 y.o. male who presents today for return visit at [facility] [place]. - GU history: 1. BPH with LUTS (frequency, urgency, weak stream). - He denies history of abnormal PSA results (none found per chart review).  - Taking Uroxatral 10 mg nightly and Proscar 5 mg daily. 2. Microscopic hematuria with negative workup. - 08/24/2021: Cystoscopy by Dr. Mena Goes showed prostate enlargement; otherwise unremarkable. - 02/09/2022: CT stone showed "2 small low-attenuation lesions in the interpolar regions of the kidneys...statistically likely to represent small cysts." No GU stones, masses, or hydronephrosis;  bladder unremarkable.   At last visit on 12/20/2022: - He reported left scrotal pain x2.5 weeks since his 65 pound dog jumped on him.  - No acute findings on exam. - Advised patient this will likely resolve in time without intervention; based on his level of concern however we agreed to obtain scrotal / testicular ultrasound for further evaluation. Advised rest / ice / compression / elevation.  - Asymptomatic for UTI however UA grossly abnormal & he has a history of UTI. Urine culture was positive for Klebsiella aerogenes; treated with Bactrim.   Since last visit: > 12/24/2022: Scrotal / testicular ultrasound showed no acute findings. He has moderate bilateral hydroceles. No mass, hematoma, seroma, etc.   Today: He reports resolution of his scrotal/testicular pain. Denies any scrotal pain, swelling, redness, or warmth.   He reports gross hematuria. He reports that the visible hematuria was first noticed on Friday 12/24/2022 and was worse the following day with blood clots; has since resolved. He had a similar incident about 1 month prior.   Denies dysuria, urinary hesitancy, straining to void, or sensations of incomplete emptying. He denies flank pain or abdominal pain; reports left  low back pain for approximately 1 month. He denies fevers, nausea, or vomiting.  At baseline he reports urgency, frequency, and nocturia x3.  He denies history of kidney stones.  He denies history of GU malignancy or pelvic radiation.  He denies history of autoimmune disease. He reports history of smoking (previously smoked cigarettes with 15 pack year history; currently using snuff tobacco). He reports taking anticoagulants (Aspirin 81 mg).   Fall Screening: Do you usually have a device to assist in your mobility? No   Medications: Current Outpatient Medications  Medication Sig Dispense Refill   acetaminophen (TYLENOL) 500 MG tablet Take 1,000 mg by mouth every 6 (six) hours as needed for moderate pain.     alfuzosin (UROXATRAL) 10 MG 24 hr tablet Take 1 tablet (10 mg total) by mouth at bedtime. 30 tablet 11   aspirin EC 81 MG tablet Take 81 mg by mouth in the morning.     Cannabidiol POWD Apply 1 application topically 3 (three) times daily as needed (pain.). CBD cream     ezetimibe (ZETIA) 10 MG tablet Take 10 mg by mouth daily.     finasteride (PROSCAR) 5 MG tablet Take 5 mg by mouth in the morning.     fluticasone (FLONASE) 50 MCG/ACT nasal spray Place 1 spray into both nostrils daily as needed for allergies or rhinitis.     lisinopril (ZESTRIL) 2.5 MG tablet TAKE ONE TABLET (2.5MG  TOTAL) BY MOUTH DAILY 30 tablet 6   nitroGLYCERIN (NITROSTAT) 0.4 MG SL tablet Place 1 tablet (0.4 mg total) under the tongue every 5 (five) minutes x 3 doses as needed for chest pain. 25 tablet 3  rosuvastatin (CRESTOR) 40 MG tablet TAKE ONE TABLET (40MG  TOTAL) BY MOUTH DAILY 30 tablet 3   Soft Lens Products (SENSITIVE EYES SALINE) SOLN Place 1 drop into both eyes 3 (three) times daily.     sulfamethoxazole-trimethoprim (BACTRIM DS) 800-160 MG tablet Take 1 tablet by mouth every 12 (twelve) hours. 14 tablet 0   nitrofurantoin, macrocrystal-monohydrate, (MACROBID) 100 MG capsule Take 1 capsule (100 mg  total) by mouth every 12 (twelve) hours. (Patient not taking: Reported on 01/04/2023) 14 capsule 0   No current facility-administered medications for this visit.    Allergies: No Known Allergies  Past Medical History:  Diagnosis Date   Arthritis    Atrial fibrillation (HCC)    Cancer (HCC)    skin   Coronary artery disease    Enlarged prostate    GERD (gastroesophageal reflux disease)    Hypertension    Myocardial infarction (HCC)    mild   Pleural effusion    Sleep apnea    not wearing  cpap  not worn in 9-10 yrs   Past Surgical History:  Procedure Laterality Date   BIOPSY  01/04/2022   Procedure: BIOPSY;  Surgeon: Corbin Ade, MD;  [facility];  Service: Endoscopy;;   CHEST TUBE INSERTION Left 08/16/2017   Procedure: INSERTION PLEURAL DRAINAGE CATHETER;  Surgeon: Kerin Perna, MD;  Location[facility] OR;  Service: Thoracic;  Laterality: Left;   COLONOSCOPY N/A 07/23/2015   Procedure: COLONOSCOPY;  Surgeon: Corbin Ade, MD;  [facility];  Service: Endoscopy;  Laterality: N/A;  10:30 Am   COLONOSCOPY N/A 08/27/2020   Procedure: COLONOSCOPY;  Surgeon: Corbin Ade, MD;  [facility];  Service: Endoscopy;  Laterality: N/A;  10:30am   CORONARY ARTERY BYPASS GRAFT N/A 06/27/2017   Procedure: CORONARY ARTERY BYPASS GRAFTING (CABG) x 2  WITH ENDOSCOPIC HARVESTING OF RIGHT SAPHENOUS VEIN;  Surgeon: Kerin Perna, MD;  Location: [facility] OR;  Service: Open Heart Surgery;  Laterality: N/A;   ESOPHAGOGASTRODUODENOSCOPY (EGD) WITH PROPOFOL N/A 01/04/2022   Procedure: ESOPHAGOGASTRODUODENOSCOPY (EGD) WITH PROPOFOL;  Surgeon: Corbin Ade, MD;  [facility];  Service: Endoscopy;  Laterality: N/A;  12:15 PM, pt knows to arrive at 7:15   IR THORACENTESIS ASP PLEURAL SPACE W/IMG GUIDE  08/08/2017   LEFT HEART CATH AND CORONARY ANGIOGRAPHY N/A 06/14/2017   Procedure: LEFT HEART CATH AND CORONARY ANGIOGRAPHY;  Surgeon: Marykay Lex, MD;   Location: [facility] INVASIVE CV LAB;  Service: Cardiovascular;  Laterality: N/A;   MALONEY DILATION N/A 01/04/2022   Procedure: Elease Hashimoto DILATION;  Surgeon: Corbin Ade, MD;  [facility];  Service: Endoscopy;  Laterality: N/A;   POLYPECTOMY  08/27/2020   Procedure: POLYPECTOMY;  Surgeon: Corbin Ade, MD;  [facility];  Service: Endoscopy;;  cecal x2   REMOVAL OF PLEURAL DRAINAGE CATHETER Left 10/13/2017   Procedure: REMOVAL OF PLEURAL DRAINAGE CATHETER;  Surgeon: Kerin Perna, MD;  Location: [facility] OR;  Service: Thoracic;  Laterality: Left;   Right hand surgery     Right knee arthroscopy     TEE WITHOUT CARDIOVERSION N/A 06/27/2017   Procedure: TRANSESOPHAGEAL ECHOCARDIOGRAM (TEE);  Surgeon: Donata Clay, Theron Arista, MD;  Location: [facility] OR;  Service: Open Heart Surgery;  Laterality: N/A;   Family History  Problem Relation Age of Onset   Alzheimer's disease Mother    Arthritis Sister    Heart attack Brother    Hypercholesterolemia Sister  Liver disease Sister    Social History   Socioeconomic History   Marital status: Married    Spouse name: Not on file   Number of children: Not on file   Years of education: Not on file   Highest education level: Not on file  Occupational History   Occupation: Retired  Tobacco Use   Smoking status: Former    Current packs/day: 0.00    Average packs/day: 0.5 packs/day for 30.0 years (15.0 ttl pk-yrs)    Types: Cigarettes    Start date: 02/15/1958    Quit date: 02/16/1988    Years since quitting: 34.9   Smokeless tobacco: Former    Types: Snuff   Tobacco comments:    USING NICORETTE GUM TO TRY AND STOP USING SMOKELESS TOBACCO  Vaping Use   Vaping status: Never Used  Substance and Sexual Activity   Alcohol use: No   Drug use: No   Sexual activity: Not Currently  Other Topics Concern   Not on file  Social History Narrative   Not on file   Social Determinants of Health   Financial Resource Strain: Not on file  Food  Insecurity: Not on file  Transportation Needs: Not on file  Physical Activity: Not on file  Stress: Not on file  Social Connections: Not on file  Intimate Partner Violence: Not on file    Review of Systems Constitutional: Patient denies any unintentional weight loss or change in strength lntegumentary: Patient denies any rashes or pruritus Cardiovascular: Patient denies chest pain or syncope Respiratory: Patient denies shortness of breath Gastrointestinal: Patient denies nausea, vomiting, constipation, or diarrhea Musculoskeletal: Patient denies muscle cramps or weakness Neurologic: Patient denies convulsions or seizures Allergic/Immunologic: Patient denies recent allergic reaction(s) Hematologic/Lymphatic: Patient denies bleeding tendencies Endocrine: Patient denies heat/cold intolerance  GU: As per HPI.  OBJECTIVE Vitals:   01/04/23 1337  BP: 117/68  Pulse: 88  Temp: (!) 97.2 F (36.2 C)   There is no height or weight on file to calculate BMI.  Physical Examination Constitutional: No obvious distress; patient is non-toxic appearing  Cardiovascular: No visible lower extremity edema.  Respiratory: The patient does not have audible wheezing/stridor; respirations do not appear labored  Gastrointestinal: Abdomen non-distended Musculoskeletal: Normal ROM of UEs  Skin: No obvious rashes/open sores  Neurologic: CN 2-12 grossly intact Psychiatric: Answered questions appropriately with normal affect  Hematologic/Lymphatic/Immunologic: No obvious bruises or sites of spontaneous bleeding  UA: 6-10 WBC/hpf, 11-30 RBC/hpf, few bacteria PVR: 5 ml  ASSESSMENT Scrotal pain - Plan: BLADDER SCAN AMB NON-IMAGING, Urinalysis, Routine w reflex microscopic  History of UTI  Benign prostatic hyperplasia with urinary frequency  Gross hematuria - Plan: Cytology, urine, CT HEMATURIA WORKUP  Former cigarette smoker - Plan: Cytology, urine, CT HEMATURIA WORKUP  Snuff user - Plan:  Cytology, urine, CT HEMATURIA WORKUP  For management of gross hematuria, we discussed possible etiologies including but not limited to: vigorous exercise, sexual activity, stone, trauma, blood thinner use, urinary tract infection, kidney function, BPH, malignancy. We discussed pt's smoking as a risk factor for GU cancer and encouraged cessation of all nicotine / tobacco products.  We discussed the importance of work-up including assessing the upper and lower GU tract with CT urogram and cystoscopy. We will also check voided cytology.  We discussed the risk for clot retention and pt was advised to increase fluid intake to thin out clots. Pt was advised to go to the ER if they become unable to urinate due to clot retention, start  having symptoms of anemia (which were discussed), or any other significant concerning acute symptoms.  Pt verbalized understanding and decided to pursue this work-up. Patient agreed to follow-up afterward to discuss the results and formulate a treatment plan based on the findings. All questions were answered.   PLAN Advised the following: 1. Requesting prior PSA results from PCP. 2. CT hematuria protocol. 3. Voided cytology. 4. Continue Uroxatral 10 mg nightly. 5. Continue Proscar 5 mg daily. 6. Return for 1st available cystoscopy with any urology MD.   Orders Placed This Encounter  Procedures   CT HEMATURIA WORKUP    Standing Status:   Future    Standing Expiration Date:   01/04/2024    Order Specific Question:   Reason for Exam (SYMPTOM  OR DIAGNOSIS REQUIRED)    Answer:   Gross hematuria    Order Specific Question:   Preferred imaging location?    Answer:   [facility]   Urinalysis, Routine w reflex microscopic   BLADDER SCAN AMB NON-IMAGING    It has been explained that the patient is to follow regularly with their PCP in addition to all other providers involved in their care and to follow instructions provided by these respective offices. Patient  advised to contact urology clinic if any urologic-pertaining questions, concerns, new symptoms or problems arise in the interim period.  There are no Patient Instructions on file for this visit.  Electronically signed by:  Donnita Falls, FNP   01/04/23    2:37 PM

## 2023-01-03 ENCOUNTER — Ambulatory Visit: Payer: PPO | Admitting: Urology

## 2023-01-04 ENCOUNTER — Ambulatory Visit: Payer: PPO | Admitting: Urology

## 2023-01-04 ENCOUNTER — Encounter: Payer: Self-pay | Admitting: Urology

## 2023-01-04 VITALS — BP 117/68 | HR 88 | Temp 97.2°F

## 2023-01-04 DIAGNOSIS — N401 Enlarged prostate with lower urinary tract symptoms: Secondary | ICD-10-CM

## 2023-01-04 DIAGNOSIS — N5082 Scrotal pain: Secondary | ICD-10-CM | POA: Diagnosis not present

## 2023-01-04 DIAGNOSIS — B349 Viral infection, unspecified: Secondary | ICD-10-CM | POA: Diagnosis not present

## 2023-01-04 DIAGNOSIS — Z720 Tobacco use: Secondary | ICD-10-CM | POA: Diagnosis not present

## 2023-01-04 DIAGNOSIS — Z87891 Personal history of nicotine dependence: Secondary | ICD-10-CM

## 2023-01-04 DIAGNOSIS — Z87440 Personal history of urinary (tract) infections: Secondary | ICD-10-CM

## 2023-01-04 DIAGNOSIS — R350 Frequency of micturition: Secondary | ICD-10-CM | POA: Diagnosis not present

## 2023-01-04 DIAGNOSIS — R310 Gross hematuria: Secondary | ICD-10-CM | POA: Diagnosis not present

## 2023-01-04 LAB — URINALYSIS, ROUTINE W REFLEX MICROSCOPIC
Bilirubin, UA: NEGATIVE
Glucose, UA: NEGATIVE
Ketones, UA: NEGATIVE
Leukocytes,UA: NEGATIVE
Nitrite, UA: NEGATIVE
Protein,UA: NEGATIVE
Specific Gravity, UA: 1.025 (ref 1.005–1.030)
Urobilinogen, Ur: 0.2 mg/dL (ref 0.2–1.0)
pH, UA: 6 (ref 5.0–7.5)

## 2023-01-04 LAB — MICROSCOPIC EXAMINATION

## 2023-01-04 NOTE — Progress Notes (Signed)
post void residual   5 ml

## 2023-01-06 LAB — CYTOLOGY, URINE

## 2023-01-06 NOTE — Progress Notes (Signed)
Please notify patient: Voided cytology was negative for any malignant findings however there was evidence of viral infection which is the likely cause of his hemorrhagic cystitis. No specific medication advised; needs to maintain adequate daily fluid intake and should follow up for cystoscopy and CT urogram as planned for further evaluation. If he develops concerning systemic viral symptoms he should consult his PCP or go to the ER if severe. Thanks.

## 2023-01-10 ENCOUNTER — Telehealth: Payer: Self-pay

## 2023-01-10 NOTE — Telephone Encounter (Signed)
Patient is made aware and voiced understanding. 

## 2023-01-10 NOTE — Telephone Encounter (Signed)
-----   Message from Donnita Falls sent at 01/06/2023  9:48 AM EST ----- Please notify patient: Voided cytology was negative for any malignant findings however there was evidence of viral infection which is the likely cause of his hemorrhagic cystitis. No specific medication advised; needs to maintain adequate daily fluid intake and should follow up for cystoscopy and CT urogram as planned for further evaluation. If he develops concerning systemic viral symptoms he should consult his PCP or go to the ER if severe. Thanks.

## 2023-02-15 NOTE — Progress Notes (Addendum)
 02/22/2023 11:30 AM   Chase Guerrero 04/10/1945 [id number]  Referring provider: Sheryle Carwin, MD [address] [place],  [place] [address]  No chief complaint on file.   HPI: This 78 year old male returns today for cystoscopy.  He has had CT hematuria protocol for evaluation of hematuria.  This did reveal enhancing 11 mm bladder lesion.  Also a 14 mm enhancing nodular focus on the median lobe.  He does have a history of elevated PSA with negative biopsy x 2 in the past.      PMH: Past Medical History:  Diagnosis Date   Arthritis    Atrial fibrillation (HCC)    Cancer (HCC)    skin   Coronary artery disease    Enlarged prostate    GERD (gastroesophageal reflux disease)    Hypertension    Myocardial infarction (HCC)    mild   Pleural effusion    Sleep apnea    not wearing  cpap  not worn in 9-10 yrs    Surgical History: Past Surgical History:  Procedure Laterality Date   BIOPSY  01/04/2022   Procedure: BIOPSY;  Surgeon: Shaaron Lamar HERO, MD;  [facility];  Service: Endoscopy;;   CHEST TUBE INSERTION Left 08/16/2017   Procedure: INSERTION PLEURAL DRAINAGE CATHETER;  Surgeon: Fleeta Hanford Coy, MD;  Location: [facility] OR;  Service: Thoracic;  Laterality: Left;   COLONOSCOPY N/A 07/23/2015   Procedure: COLONOSCOPY;  Surgeon: Lamar HERO Shaaron, MD;  [facility];  Service: Endoscopy;  Laterality: N/A;  10:30 Am   COLONOSCOPY N/A 08/27/2020   Procedure: COLONOSCOPY;  Surgeon: Shaaron Lamar HERO, MD;  [facility];  Service: Endoscopy;  Laterality: N/A;  10:30am   CORONARY ARTERY BYPASS GRAFT N/A 06/27/2017   Procedure: CORONARY ARTERY BYPASS GRAFTING (CABG) x 2  WITH ENDOSCOPIC HARVESTING OF RIGHT SAPHENOUS VEIN;  Surgeon: Fleeta Hanford Coy, MD;  Location: [facility] OR;  Service: Open Heart Surgery;  Laterality: N/A;   ESOPHAGOGASTRODUODENOSCOPY (EGD) WITH PROPOFOL  N/A 01/04/2022   Procedure: ESOPHAGOGASTRODUODENOSCOPY (EGD) WITH PROPOFOL ;  Surgeon: Shaaron Lamar HERO, MD;  [facility];  Service: Endoscopy;  Laterality: N/A;  12:15 PM, pt knows to arrive at 7:15   IR THORACENTESIS ASP PLEURAL SPACE W/IMG GUIDE  08/08/2017   LEFT HEART CATH AND CORONARY ANGIOGRAPHY N/A 06/14/2017   Procedure: LEFT HEART CATH AND CORONARY ANGIOGRAPHY;  Surgeon: Anner Alm ORN, MD;  Location: [facility];  Service: Cardiovascular;  Laterality: N/A;   MALONEY DILATION N/A 01/04/2022   Procedure: AGAPITO DILATION;  Surgeon: Shaaron Lamar HERO, MD;  [facility];  Service: Endoscopy;  Laterality: N/A;   POLYPECTOMY  08/27/2020   Procedure: POLYPECTOMY;  Surgeon: Shaaron Lamar HERO, MD;  [facility];  Service: Endoscopy;;  cecal x2   REMOVAL OF PLEURAL DRAINAGE CATHETER Left 10/13/2017   Procedure: REMOVAL OF PLEURAL DRAINAGE CATHETER;  Surgeon: Fleeta Hanford Coy, MD;  Location: [facility] OR;  Service: Thoracic;  Laterality: Left;   Right hand surgery     Right knee arthroscopy     TEE WITHOUT CARDIOVERSION N/A 06/27/2017   Procedure: TRANSESOPHAGEAL ECHOCARDIOGRAM (TEE);  Surgeon: Fleeta Hanford, Coy, MD;  Location: [facility] OR;  Service: Open Heart Surgery;  Laterality: N/A;    Home Medications:  Allergies as of 02/22/2023   No Known Allergies      Medication List        Accurate as of February 21, 2023 11:30 AM. If you  have any questions, ask your nurse or doctor.          acetaminophen  500 MG tablet Commonly known as: TYLENOL  Take 1,000 mg by mouth every 6 (six) hours as needed for moderate pain.   alfuzosin  10 MG 24 hr tablet Commonly known as: UROXATRAL  Take 1 tablet (10 mg total) by mouth at bedtime.   aspirin  EC 81 MG tablet Take 81 mg by mouth in the morning.   Cannabidiol Powd Apply 1 application topically 3 (three) times daily as needed (pain.). CBD cream   ezetimibe  10 MG tablet Commonly known as: ZETIA  Take 10 mg by mouth daily.   finasteride  5 MG tablet Commonly known as: PROSCAR  Take 5 mg by mouth in the morning.    fluticasone 50 MCG/ACT nasal spray Commonly known as: FLONASE Place 1 spray into both nostrils daily as needed for allergies or rhinitis.   lisinopril  2.5 MG tablet Commonly known as: ZESTRIL  TAKE ONE TABLET (2.5MG  TOTAL) BY MOUTH DAILY   nitrofurantoin  (macrocrystal-monohydrate) 100 MG capsule Commonly known as: MACROBID  Take 1 capsule (100 mg total) by mouth every 12 (twelve) hours.   nitroGLYCERIN  0.4 MG SL tablet Commonly known as: NITROSTAT  Place 1 tablet (0.4 mg total) under the tongue every 5 (five) minutes x 3 doses as needed for chest pain.   rosuvastatin  40 MG tablet Commonly known as: CRESTOR  TAKE ONE TABLET (40MG  TOTAL) BY MOUTH DAILY   Sensitive Eyes Saline Soln Place 1 drop into both eyes 3 (three) times daily.   sulfamethoxazole -trimethoprim  800-160 MG tablet Commonly known as: BACTRIM  DS Take 1 tablet by mouth every 12 (twelve) hours.        Allergies: No Known Allergies  Family History: Family History  Problem Relation Age of Onset   Alzheimer's disease Mother    Arthritis Sister    Heart attack Brother    Hypercholesterolemia Sister    Liver disease Sister     Social History:  reports that he quit smoking about 35 years ago. His smoking use included cigarettes. He started smoking about 65 years ago. He has a 15 pack-year smoking history. He has quit using smokeless tobacco.  His smokeless tobacco use included snuff. He reports that he does not drink alcohol and does not use drugs.  ROS: All other review of systems were reviewed and are negative except what is noted above in HPI  Physical Exam: There were no vitals taken for this visit.  Constitutional:  Alert and oriented, No acute distress. HEENT: [place] AT, moist mucus membranes.  Trachea midline, no masses. Cardiovascular: No clubbing, cyanosis, or edema. Respiratory: Normal respiratory effort, no increased work of breathing. Skin: No rashes, bruises or suspicious lesions. Neurologic: Grossly  intact, no focal deficits, moving all 4 extremities. Psychiatric: Normal mood and affect.  Laboratory Data: Lab Results  Component Value Date   WBC 7.9 08/16/2017   HGB 12.9 (L) 08/16/2017   HCT 43.8 08/16/2017   MCV 89.8 08/16/2017   PLT 348 08/16/2017    Lab Results  Component Value Date   CREATININE 1.50 (H) 02/17/2023    No results found for: PSA  No results found for: TESTOSTERONE  Lab Results  Component Value Date   HGBA1C 5.9 (H) 06/24/2017    Urinalysis    Component Value Date/Time   COLORURINE YELLOW 06/24/2017 0919   APPEARANCEUR Clear 01/04/2023 1341   LABSPEC 1.013 06/24/2017 0919   PHURINE 5.0 06/24/2017 0919   GLUCOSEU Negative 01/04/2023 1341   HGBUR MODERATE (A) 06/24/2017  0919   BILIRUBINUR Negative 01/04/2023 1341   KETONESUR NEGATIVE 06/24/2017 0919   PROTEINUR Negative 01/04/2023 1341   PROTEINUR NEGATIVE 06/24/2017 0919   NITRITE Negative 01/04/2023 1341   NITRITE NEGATIVE 06/24/2017 0919   LEUKOCYTESUR Negative 01/04/2023 1341    Lab Results  Component Value Date   LABMICR See below: 01/04/2023   WBCUA 6-10 (A) 01/04/2023   LABEPIT 0-10 01/04/2023   MUCUS Present (A) 01/04/2023   BACTERIA Few (A) 01/04/2023    Pertinent Imaging:   No results found for this or any previous visit.  Results for orders placed during the hospital encounter of 02/17/23  CT HEMATURIA WORKUP  Narrative CLINICAL DATA:  Gross hematuria * Tracking Code: BO *  EXAM: CT ABDOMEN AND PELVIS WITHOUT AND WITH CONTRAST  TECHNIQUE: Multidetector CT imaging of the abdomen and pelvis was performed following the standard protocol before and following the bolus administration of intravenous contrast.  RADIATION DOSE REDUCTION: This exam was performed according to the departmental dose-optimization program which includes automated exposure control, adjustment of the mA and/or kV according to patient size and/or use of iterative reconstruction  technique.  CONTRAST:  OMNIPAQUE  IOHEXOL  300 MG/ML  SOLN  COMPARISON:  CT February 09, 2022  FINDINGS: Lower chest: Bibasilar atelectasis/scarring. Prior median sternotomy. Coronary artery calcifications.  Hepatobiliary: Subcentimeter segment IV hepatic hypodensity on image 16/2 is technically too small to accurately characterize. Gallbladder is unremarkable. No biliary ductal dilation.  Pancreas: No pancreatic ductal dilation or evidence of acute inflammation. 13 mm nodule in the tail of the pancreas on image 27/6 follows spleen on all 3 phases of acquired imaging and is favored to reflect a splenule.  Spleen: No splenomegaly or focal splenic lesion.  Adrenals/Urinary Tract: Bilateral adrenal glands appear normal.  No hydronephrosis. No renal, ureteral or bladder calculi. Bilateral fluid signal renal cysts. Additional tiny hypodense renal lesions technically too small to accurately characterize but statistically likely to reflect cysts.  Kidneys demonstrate symmetric enhancement and excretion of contrast material. No collecting system duplication. No suspicious filling defect identified within the opacified portions of the collecting systems or ureters on delayed imaging.  Effacement of the urinary bladder by an enlarged prostate gland. Enhancing endophytic bladder wall mass in the anterior bladder wall measuring 11 mm on image 71/11.  Stomach/Bowel: Stomach is unremarkable for degree of distension. Normal appendix. Colonic diverticulosis. No evidence of bowel obstruction or acute bowel inflammation.  Vascular/Lymphatic: Aortic atherosclerosis. Normal caliber abdominal aorta. Smooth IVC contours. The portal, splenic and superior mesenteric veins are patent. No pathologically enlarged abdominal or pelvic lymph nodes.  Reproductive: Enlarged prostate gland with median lobe hypertrophy in an enhancing nodular focus along the superior aspect of the median lobe  measuring 14 mm on image 78/6.  Other: No significant abdominopelvic free fluid.  Musculoskeletal: No aggressive lytic or blastic lesion of bone. 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  IMPRESSION: 1. Enhancing 11 mm endophytic anterior bladder wall mass, highly suspicious for primary bladder neoplasm. Suggest urology consultation and further evaluation with cystoscopy. 2. Enlarged prostate gland with median lobe hypertrophy and an enhancing nodular focus along the superior aspect of the median lobe measuring 14 mm. Suggest attention at cystoscopy and correlation with PSA with consideration for consider further evaluation by prostate MRI. 3. No hydronephrosis. No renal, ureteral or bladder calculi. 4. Subcentimeter segment IV hepatic hypodensity is technically too small to accurately characterize. Suggest attention on follow-up imaging. 5. 13 mm nodule in the tail  of the pancreas follows spleen on all 3 phases of acquired imaging and is favored to reflect a splenule. 6. Colonic diverticulosis. 7. 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 8.  Aortic Atherosclerosis (ICD10-I70.0).   Electronically Signed By: Reyes Holder M.D. On: 02/19/2023 13:09  . Cystoscopy Procedure Note:  Indication: Hematuria, bladder mass on CT scan  After informed consent and discussion of the procedure and its risks, Chase Guerrero was positioned and prepped in the standard fashion.  Cystoscopy was performed with a flexible cystoscope.   Findings: Urethra: No lesion.  Minimal meatal stenosis. Prostate: Obstructive with bilobar hypertrophy Bladder neck: Open Ureteral orifices: Normal in location and configuration bilaterally Bladder: No foreign bodies.  Bladder was fairly well empty.  15 mm polypoid lesion in right anterior bladder wall consistent with urothelial carcinoma  The patient tolerated the procedure well.      Assessment &   1.  Gross hematuria (Primary) This is most likely due to his bladder lesion  2. Benign prostatic hyperplasia with urinary frequency Fairly symptomatic.  He does have moderate obstruction on cystoscopy.  It does appear that he empties well, however.  3.  History of elevated PSA with biopsy negative x 2.  He does have a nodular abnormality on his CT scan.  Plan:   1.  I discussed TURBT/gemcitabine  administration with him.  He would prefer to have this done in [place].  He has been seen in the past by Dr. Sherrilee so I will have Dr. Sherrilee performed the procedure after cardiac clearance.  2.  Eventually I think we will workup his prostatic abnormality with MRI  3.  PSA is checked today.   No follow-ups on file.  Garnette CHRISTELLA Shack, MD  [facility] [place]

## 2023-02-17 ENCOUNTER — Ambulatory Visit (HOSPITAL_COMMUNITY)
Admission: RE | Admit: 2023-02-17 | Discharge: 2023-02-17 | Disposition: A | Discharge status: Home / Self Care | Payer: PPO | Source: Ambulatory Visit | Attending: Urology | Admitting: Urology

## 2023-02-17 DIAGNOSIS — K5730 Diverticulosis of large intestine without perforation or abscess without bleeding: Secondary | ICD-10-CM | POA: Diagnosis not present

## 2023-02-17 DIAGNOSIS — Z720 Tobacco use: Secondary | ICD-10-CM | POA: Diagnosis not present

## 2023-02-17 DIAGNOSIS — Z87891 Personal history of nicotine dependence: Secondary | ICD-10-CM | POA: Diagnosis not present

## 2023-02-17 DIAGNOSIS — R310 Gross hematuria: Secondary | ICD-10-CM | POA: Insufficient documentation

## 2023-02-17 DIAGNOSIS — N400 Enlarged prostate without lower urinary tract symptoms: Secondary | ICD-10-CM | POA: Diagnosis not present

## 2023-02-17 LAB — POCT I-STAT CREATININE: Creatinine, Ser: 1.5 mg/dL — ABNORMAL HIGH (ref 0.61–1.24)

## 2023-02-17 MED ORDER — IOHEXOL 300 MG/ML  SOLN
125.0000 mL | Freq: Once | INTRAMUSCULAR | Status: AC | PRN
  Administered 2023-02-17: 125 mL via INTRAVENOUS

## 2023-02-22 ENCOUNTER — Ambulatory Visit: Payer: PPO | Admitting: Urology

## 2023-02-22 VITALS — BP 129/73 | HR 86

## 2023-02-22 DIAGNOSIS — N401 Enlarged prostate with lower urinary tract symptoms: Secondary | ICD-10-CM

## 2023-02-22 DIAGNOSIS — R350 Frequency of micturition: Secondary | ICD-10-CM

## 2023-02-22 DIAGNOSIS — R972 Elevated prostate specific antigen [PSA]: Secondary | ICD-10-CM | POA: Diagnosis not present

## 2023-02-22 DIAGNOSIS — R310 Gross hematuria: Secondary | ICD-10-CM

## 2023-02-22 DIAGNOSIS — N3289 Other specified disorders of bladder: Secondary | ICD-10-CM | POA: Diagnosis not present

## 2023-02-22 LAB — URINALYSIS, ROUTINE W REFLEX MICROSCOPIC
Bilirubin, UA: NEGATIVE
Glucose, UA: NEGATIVE
Ketones, UA: NEGATIVE
Leukocytes,UA: NEGATIVE
Nitrite, UA: NEGATIVE
Specific Gravity, UA: 1.025 (ref 1.005–1.030)
Urobilinogen, Ur: 0.2 mg/dL (ref 0.2–1.0)
pH, UA: 6 (ref 5.0–7.5)

## 2023-02-22 LAB — MICROSCOPIC EXAMINATION
Bacteria, UA: NONE SEEN
RBC, Urine: >30 /[HPF] — AB (ref 0–2)

## 2023-02-22 MED ORDER — CIPROFLOXACIN HCL 500 MG PO TABS
500.0000 mg | ORAL_TABLET | Freq: Once | ORAL | Status: AC
  Administered 2023-02-22: 500 mg via ORAL

## 2023-02-23 ENCOUNTER — Other Ambulatory Visit: Payer: Self-pay

## 2023-02-23 DIAGNOSIS — N3289 Other specified disorders of bladder: Secondary | ICD-10-CM

## 2023-03-08 ENCOUNTER — Telehealth: Payer: Self-pay | Admitting: *Deleted

## 2023-03-08 ENCOUNTER — Telehealth: Payer: Self-pay | Admitting: Cardiology

## 2023-03-08 NOTE — Telephone Encounter (Signed)
Pt has been scheduled tele preop appt 03/22/23. Med rec and consent are done.    Patient Consent for Virtual Visit        Chase Guerrero has provided verbal consent on 03/08/2023 for a virtual visit (video or telephone).   CONSENT FOR VIRTUAL VISIT FOR:  Chase Guerrero  By participating in this virtual visit I agree to the following:  I hereby voluntarily request, consent and authorize [facility] and its employed or contracted physicians, physician assistants, nurse practitioners or other licensed health care professionals (the Practitioner), to provide me with telemedicine health care services (the "Services") as deemed necessary by the treating Practitioner. I acknowledge and consent to receive the Services by the Practitioner via telemedicine. I understand that the telemedicine visit will involve communicating with the Practitioner through live audiovisual communication technology and the disclosure of certain medical information by electronic transmission. I acknowledge that I have been given the opportunity to request an in-person assessment or other available alternative prior to the telemedicine visit and am voluntarily participating in the telemedicine visit.  I understand that I have the right to withhold or withdraw my consent to the use of telemedicine in the course of my care at any time, without affecting my right to future care or treatment, and that the Practitioner or I may terminate the telemedicine visit at any time. I understand that I have the right to inspect all information obtained and/or recorded in the course of the telemedicine visit and may receive copies of available information for a reasonable fee.  I understand that some of the potential risks of receiving the Services via telemedicine include:  Delay or interruption in medical evaluation due to technological equipment failure or disruption; Information transmitted may not be sufficient (e.g. poor resolution  of images) to allow for appropriate medical decision making by the Practitioner; and/or  In rare instances, security protocols could fail, causing a breach of personal health information.  Furthermore, I acknowledge that it is my responsibility to provide information about my medical history, conditions and care that is complete and accurate to the best of my ability. I acknowledge that Practitioner's advice, recommendations, and/or decision may be based on factors not within their control, such as incomplete or inaccurate data provided by me or distortions of diagnostic images or specimens that may result from electronic transmissions. I understand that the practice of medicine is not an exact science and that Practitioner makes no warranties or guarantees regarding treatment outcomes. I acknowledge that a copy of this consent can be made available to me via my patient portal [facility] MyChart), or I can request a printed copy by calling the office of [facility].    I understand that my insurance will be billed for this visit.   I have read or had this consent read to me. I understand the contents of this consent, which adequately explains the benefits and risks of the Services being provided via telemedicine.  I have been provided ample opportunity to ask questions regarding this consent and the Services and have had my questions answered to my satisfaction. I give my informed consent for the services to be provided through the use of telemedicine in my medical care

## 2023-03-08 NOTE — Telephone Encounter (Signed)
   Pre-operative Risk Assessment    Patient Name: Chase Guerrero  DOB: January 01, 1946 MRN: [id number]   Date of last office visit: 08/23/2022 Date of next office visit: none   Request for Surgical Clearance    Procedure:   Transurethral resection of bladder tumor  Date of Surgery:  Clearance 03/31/23                                Surgeon:  Dr. Casandra Doffing Group or Practice Name:  [facility] Phone number:  [phone] Fax number:  [phone]   Type of Clearance Requested:   - Medical  - Pharmacy:  Hold Aspirin Medication not indicated on clearance request   Type of Anesthesia:  General    Additional requests/questions:    SignedRoyann Shivers   03/08/2023, 12:01 PM

## 2023-03-08 NOTE — Telephone Encounter (Signed)
Pt has been scheduled tele preop appt 03/22/23. Med rec and consent are done.

## 2023-03-08 NOTE — Telephone Encounter (Signed)
   Name: Chase Guerrero  DOB: 11-21-1945  MRN: [id number]  Primary Cardiologist: Dina Rich, MD   Preoperative team, please contact this patient and set up a phone call appointment for further preoperative risk assessment. Please obtain consent and complete medication review. Thank you for your help.  I confirm that guidance regarding antiplatelet and oral anticoagulation therapy has been completed and, if necessary, noted below. ASA can be held x 7 days   I also confirmed the patient resides in the state of [place]. As per [facility] Medical Board telemedicine laws, the patient must reside in the state in which the provider is licensed. [employer]  [place], [place] 03/08/2023, 12:49 PM  [facility]

## 2023-03-22 ENCOUNTER — Encounter: Payer: Self-pay | Admitting: Nurse Practitioner

## 2023-03-22 ENCOUNTER — Ambulatory Visit: Payer: HMO | Attending: Nurse Practitioner | Admitting: Nurse Practitioner

## 2023-03-22 DIAGNOSIS — Z01810 Encounter for preprocedural cardiovascular examination: Secondary | ICD-10-CM | POA: Diagnosis not present

## 2023-03-22 NOTE — Progress Notes (Signed)
Virtual Visit via Telephone Note   Because of Chase Guerrero co-morbid illnesses, he is at least at moderate risk for complications without adequate follow up.  This format is felt to be most appropriate for this patient at this time.  The patient did not have access to video technology/had technical difficulties with video requiring transitioning to audio format only (telephone).  All issues noted in this document were discussed and addressed.  No physical exam could be performed with this format.  Please refer to the patient's chart for his consent to telehealth for [facility].  Evaluation Performed:  Preoperative cardiovascular risk assessment _____________   Date:  03/22/2023   Patient ID:  Chase Guerrero, DOB October 31, 1945, MRN [id number] Patient Location:  Home Provider location:   Office  Primary Care Provider:  Carylon Perches, MD Primary Cardiologist:  Dina Rich, MD  Chief Complaint / Patient Profile   78 y.o. y/o male with a h/o CAD s/p CABG 2019, hyperlipidemia, hypertension orthostatic hypotension who is pending transurethral resection of bladder tumor with Dr. Ronne Binning on 03/31/23 and presents today for telephonic preoperative cardiovascular risk assessment.  History of Present Illness    Chase Guerrero is a 78 y.o. male who presents via audio/video conferencing for a telehealth visit today.  Pt was last seen in cardiology clinic on 08/23/2022 by Dr. Dina Rich.  At that time MANDEEP KISER was doing well.  The patient is now pending procedure as outlined above. Since his last visit, he denies chest pain, shortness of breath, lower extremity edema, fatigue, palpitations, melena, hematuria, hemoptysis, diaphoresis, weakness, presyncope, syncope, orthopnea, and PND. He remains active with yard work and walking and is able to achieve > 4 METS activity without concerning cardiac symptoms.  Past Medical History    Past Medical History:  Diagnosis Date    Arthritis    Atrial fibrillation (HCC)    Cancer (HCC)    skin   Coronary artery disease    Enlarged prostate    GERD (gastroesophageal reflux disease)    Hypertension    Myocardial infarction (HCC)    mild   Pleural effusion    Sleep apnea    not wearing  cpap  not worn in 9-10 yrs   Past Surgical History:  Procedure Laterality Date   BIOPSY  01/04/2022   Procedure: BIOPSY;  Surgeon: Corbin Ade, MD;  [facility];  Service: Endoscopy;;   CHEST TUBE INSERTION Left 08/16/2017   Procedure: INSERTION PLEURAL DRAINAGE CATHETER;  Surgeon: Kerin Perna, MD;  Location: [facility] OR;  Service: Thoracic;  Laterality: Left;   COLONOSCOPY N/A 07/23/2015   Procedure: COLONOSCOPY;  Surgeon: Corbin Ade, MD;  [facility];  Service: Endoscopy;  Laterality: N/A;  10:30 Am   COLONOSCOPY N/A 08/27/2020   Procedure: COLONOSCOPY;  Surgeon: Corbin Ade, MD;  [facility];  Service: Endoscopy;  Laterality: N/A;  10:30am   CORONARY ARTERY BYPASS GRAFT N/A 06/27/2017   Procedure: CORONARY ARTERY BYPASS GRAFTING (CABG) x 2  WITH ENDOSCOPIC HARVESTING OF RIGHT SAPHENOUS VEIN;  Surgeon: Kerin Perna, MD;  Location: [facility] OR;  Service: Open Heart Surgery;  Laterality: N/A;   ESOPHAGOGASTRODUODENOSCOPY (EGD) WITH PROPOFOL N/A 01/04/2022   Procedure: ESOPHAGOGASTRODUODENOSCOPY (EGD) WITH PROPOFOL;  Surgeon: Corbin Ade, MD;  [facility];  Service: Endoscopy;  Laterality: N/A;  12:15 PM, pt knows to arrive at 7:15   IR THORACENTESIS ASP PLEURAL SPACE W/IMG GUIDE  08/08/2017   LEFT HEART CATH AND CORONARY ANGIOGRAPHY N/A 06/14/2017   Procedure: LEFT HEART CATH AND CORONARY ANGIOGRAPHY;  Surgeon: Marykay Lex, MD;  Location: [facility] INVASIVE CV LAB;  Service: Cardiovascular;  Laterality: N/A;   MALONEY DILATION N/A 01/04/2022   Procedure: Elease Hashimoto DILATION;  Surgeon: Corbin Ade, MD;  [facility];  Service: Endoscopy;  Laterality: N/A;   POLYPECTOMY   08/27/2020   Procedure: POLYPECTOMY;  Surgeon: Corbin Ade, MD;  [facility];  Service: Endoscopy;;  cecal x2   REMOVAL OF PLEURAL DRAINAGE CATHETER Left 10/13/2017   Procedure: REMOVAL OF PLEURAL DRAINAGE CATHETER;  Surgeon: Kerin Perna, MD;  Location: [facility] OR;  Service: Thoracic;  Laterality: Left;   Right hand surgery     Right knee arthroscopy     TEE WITHOUT CARDIOVERSION N/A 06/27/2017   Procedure: TRANSESOPHAGEAL ECHOCARDIOGRAM (TEE);  Surgeon: Donata Clay, Theron Arista, MD;  Location: [facility] OR;  Service: Open Heart Surgery;  Laterality: N/A;    Allergies  No Known Allergies  Home Medications    Prior to Admission medications   Medication Sig Start Date End Date Taking? Authorizing Provider  acetaminophen (TYLENOL) 500 MG tablet Take 1,000 mg by mouth every 6 (six) hours as needed for moderate pain.    [provider]  alfuzosin (UROXATRAL) 10 MG 24 hr tablet Take 1 tablet (10 mg total) by mouth at bedtime. 11/22/22   McKenzie, Mardene Celeste, MD  aspirin EC 81 MG tablet Take 81 mg by mouth in the morning.    [provider]  Cannabidiol POWD Apply 1 application topically 3 (three) times daily as needed (pain.). CBD cream    [provider]  ezetimibe (ZETIA) 10 MG tablet Take 10 mg by mouth daily. 01/07/21   [provider]  finasteride (PROSCAR) 5 MG tablet Take 5 mg by mouth in the morning.    [provider]  fluticasone (FLONASE) 50 MCG/ACT nasal spray Place 1 spray into both nostrils daily as needed for allergies or rhinitis.    [provider]  lisinopril (ZESTRIL) 2.5 MG tablet TAKE ONE TABLET (2.5MG  TOTAL) BY MOUTH DAILY 12/02/22   Antoine Poche, MD  nitrofurantoin, macrocrystal-monohydrate, (MACROBID) 100 MG capsule Take 1 capsule (100 mg total) by mouth every 12 (twelve) hours. 11/22/22   McKenzie, Mardene Celeste, MD  nitroGLYCERIN (NITROSTAT) 0.4 MG SL tablet Place 1 tablet (0.4 mg total) under the tongue every 5 (five)  minutes x 3 doses as needed for chest pain. 08/23/22   Antoine Poche, MD  rosuvastatin (CRESTOR) 40 MG tablet TAKE ONE TABLET (40MG  TOTAL) BY MOUTH DAILY 11/16/22   Antoine Poche, MD  Soft Lens Products (SENSITIVE EYES SALINE) SOLN Place 1 drop into both eyes 3 (three) times daily.    [provider]  sulfamethoxazole-trimethoprim (BACTRIM DS) 800-160 MG tablet Take 1 tablet by mouth every 12 (twelve) hours. 12/23/22   Donnita Falls, FNP    Physical Exam    Vital Signs:  Chase Guerrero does not have vital signs available for review today.  Given telephonic nature of communication, physical exam is limited. AAOx3. NAD. Normal affect.  Speech and respirations are unlabored.  Accessory Clinical Findings    None  Assessment & Plan    1.  Preoperative Cardiovascular Risk Assessment: According to the Revised Cardiac Risk Index (RCRI), his Perioperative Risk of Major Cardiac Event is (%): 6.6 .His Functional Capacity in METs is: 7.59 according to the  [facility] Activity Status Index (DASI). The patient is doing well from a cardiac perspective. Therefore, based on ACC/AHA guidelines, the patient would be at acceptable risk for the planned procedure without further cardiovascular testing.   The patient was advised that if he develops new symptoms prior to surgery to contact our office to arrange for a follow-up visit, and he verbalized understanding.  Per office protocol, he may hold aspirin for 5-7 days prior to procedure and should resume as soon as hemodynamically stable postoperatively.  A copy of this note will be routed to requesting surgeon.  Time:   Today, I have spent 10 minutes with the patient with telehealth technology discussing medical history, symptoms, and management plan.     Levi Aland, NP-C  03/22/2023, 9:04 AM [address] Office [phone] Fax [phone]

## 2023-03-23 ENCOUNTER — Encounter: Payer: Self-pay | Admitting: Cardiology

## 2023-03-23 ENCOUNTER — Ambulatory Visit: Payer: HMO | Attending: Cardiology | Admitting: Cardiology

## 2023-03-23 VITALS — BP 106/58 | HR 76 | Ht 70.0 in | Wt 189.2 lb

## 2023-03-23 DIAGNOSIS — E782 Mixed hyperlipidemia: Secondary | ICD-10-CM | POA: Diagnosis not present

## 2023-03-23 DIAGNOSIS — I2510 Atherosclerotic heart disease of native coronary artery without angina pectoris: Secondary | ICD-10-CM

## 2023-03-23 NOTE — Patient Instructions (Signed)
Medication Instructions:  Continue all current medications.   Labwork: none  Testing/Procedures: none  Follow-Up: 6 months   Any Other Special Instructions Will Be Listed Below (If Applicable).   If you need a refill on your cardiac medications before your next appointment, please call your pharmacy.

## 2023-03-23 NOTE — Progress Notes (Addendum)
Clinical Summary Mr. Neidig is a 78 y.o.male seen today for follow up of the following medical problems.    1. CAD - seen in 05/2017 for chest pain - 05/2017 high risk nuclear stress test as reported below, referred for cath - 06/2017 cath distal LM to LAD 100%, LVEF 35-45% by LV gram, referred for CABG   - 06/27/17 CABG with LIMA-LAD, SVG-diag -07/2017 echo LVEF 60-65%.      - he is off metoprolol due to low bp's and orthostatic symptoms  -EKG today shows NSR - no chest pains, no SOB/DOE - compliant with meds   2. Hyperlipidemia   06/2022 TC 110 TG 104 HDL 35 LDL 55 - he is on crestor 40mg  daily, zetia 10mg       3. Bladder tumor - plans for resection by urology Past Medical History:  Diagnosis Date   Arthritis    Atrial fibrillation (HCC)    Cancer (HCC)    skin   Coronary artery disease    Enlarged prostate    GERD (gastroesophageal reflux disease)    Hypertension    Myocardial infarction (HCC)    mild   Pleural effusion    Sleep apnea    not wearing  cpap  not worn in 9-10 yrs     No Known Allergies   Current Outpatient Medications  Medication Sig Dispense Refill   acetaminophen (TYLENOL) 500 MG tablet Take 1,000 mg by mouth every 6 (six) hours as needed for moderate pain.     alfuzosin (UROXATRAL) 10 MG 24 hr tablet Take 1 tablet (10 mg total) by mouth at bedtime. 30 tablet 11   aspirin EC 81 MG tablet Take 81 mg by mouth in the morning.     Cannabidiol POWD Apply 1 application topically 3 (three) times daily as needed (pain.). CBD cream     ezetimibe (ZETIA) 10 MG tablet Take 10 mg by mouth daily.     finasteride (PROSCAR) 5 MG tablet Take 5 mg by mouth in the morning.     fluticasone (FLONASE) 50 MCG/ACT nasal spray Place 1 spray into both nostrils daily as needed for allergies or rhinitis.     lisinopril (ZESTRIL) 2.5 MG tablet TAKE ONE TABLET (2.5MG  TOTAL) BY MOUTH DAILY 30 tablet 6   nitrofurantoin, macrocrystal-monohydrate, (MACROBID) 100 MG  capsule Take 1 capsule (100 mg total) by mouth every 12 (twelve) hours. 14 capsule 0   nitroGLYCERIN (NITROSTAT) 0.4 MG SL tablet Place 1 tablet (0.4 mg total) under the tongue every 5 (five) minutes x 3 doses as needed for chest pain. 25 tablet 3   rosuvastatin (CRESTOR) 40 MG tablet TAKE ONE TABLET (40MG  TOTAL) BY MOUTH DAILY 30 tablet 3   Soft Lens Products (SENSITIVE EYES SALINE) SOLN Place 1 drop into both eyes 3 (three) times daily.     sulfamethoxazole-trimethoprim (BACTRIM DS) 800-160 MG tablet Take 1 tablet by mouth every 12 (twelve) hours. 14 tablet 0   No current facility-administered medications for this visit.     Past Surgical History:  Procedure Laterality Date   BIOPSY  01/04/2022   Procedure: BIOPSY;  Surgeon: Corbin Ade, MD;  [facility];  Service: Endoscopy;;   CHEST TUBE INSERTION Left 08/16/2017   Procedure: INSERTION PLEURAL DRAINAGE CATHETER;  Surgeon: Kerin Perna, MD;  Location[facility] OR;  Service: Thoracic;  Laterality: Left;   COLONOSCOPY N/A 07/23/2015   Procedure: COLONOSCOPY;  Surgeon: Corbin Ade, MD;  [facility];  Service: Endoscopy;  Laterality: N/A;  10:30 Am   COLONOSCOPY N/A 08/27/2020   Procedure: COLONOSCOPY;  Surgeon: Corbin Ade, MD;  [facility];  Service: Endoscopy;  Laterality: N/A;  10:30am   CORONARY ARTERY BYPASS GRAFT N/A 06/27/2017   Procedure: CORONARY ARTERY BYPASS GRAFTING (CABG) x 2  WITH ENDOSCOPIC HARVESTING OF RIGHT SAPHENOUS VEIN;  Surgeon: Kerin Perna, MD;  Location: [facility] OR;  Service: Open Heart Surgery;  Laterality: N/A;   ESOPHAGOGASTRODUODENOSCOPY (EGD) WITH PROPOFOL N/A 01/04/2022   Procedure: ESOPHAGOGASTRODUODENOSCOPY (EGD) WITH PROPOFOL;  Surgeon: Corbin Ade, MD;  [facility];  Service: Endoscopy;  Laterality: N/A;  12:15 PM, pt knows to arrive at 7:15   IR THORACENTESIS ASP PLEURAL SPACE W/IMG GUIDE  08/08/2017   LEFT HEART CATH AND CORONARY ANGIOGRAPHY N/A  06/14/2017   Procedure: LEFT HEART CATH AND CORONARY ANGIOGRAPHY;  Surgeon: Marykay Lex, MD;  Location: [facility] INVASIVE CV LAB;  Service: Cardiovascular;  Laterality: N/A;   MALONEY DILATION N/A 01/04/2022   Procedure: Elease Hashimoto DILATION;  Surgeon: Corbin Ade, MD;  [facility];  Service: Endoscopy;  Laterality: N/A;   POLYPECTOMY  08/27/2020   Procedure: POLYPECTOMY;  Surgeon: Corbin Ade, MD;  [facility];  Service: Endoscopy;;  cecal x2   REMOVAL OF PLEURAL DRAINAGE CATHETER Left 10/13/2017   Procedure: REMOVAL OF PLEURAL DRAINAGE CATHETER;  Surgeon: Kerin Perna, MD;  Location: [facility] OR;  Service: Thoracic;  Laterality: Left;   Right hand surgery     Right knee arthroscopy     TEE WITHOUT CARDIOVERSION N/A 06/27/2017   Procedure: TRANSESOPHAGEAL ECHOCARDIOGRAM (TEE);  Surgeon: Donata Clay, Theron Arista, MD;  Location: [facility] OR;  Service: Open Heart Surgery;  Laterality: N/A;     No Known Allergies    Family History  Problem Relation Age of Onset   Alzheimer's disease Mother    Arthritis Sister    Heart attack Brother    Hypercholesterolemia Sister    Liver disease Sister      Social History Mr. Kading reports that he quit smoking about 35 years ago. His smoking use included cigarettes. He started smoking about 65 years ago. He has a 15 pack-year smoking history. He has quit using smokeless tobacco.  His smokeless tobacco use included snuff. Mr. Zieger reports no history of alcohol use.   Marland Kitchen   Physical Examination Today's Vitals   03/23/23 1429  BP: (!) 106/58  Pulse: 76  SpO2: 93%  Weight: 189 lb 3.2 oz (85.8 kg)  Height: 5\' 10"  (1.778 m)   Body mass index is 27.15 kg/m.  Gen: resting comfortably, no acute distress HEENT: no scleral icterus, pupils equal round and reactive, no palptable cervical adenopathy,  CV: RRR, no mrg, no jvd Resp: Clear to auscultation bilaterally GI: abdomen is soft, non-tender, non-distended, normal bowel sounds, no  hepatosplenomegaly MSK: extremities are warm, no edema.  Skin: warm, no rash Neuro:  no focal deficits Psych: appropriate affect   Diagnostic Studies  05/2017 nuclear stress Blood pressure demonstrated a hypertensive response to exercise. 1 mm horizontal ST segment depressions in leads II, III, and aVF in recovery with nonspecific horizontal ST segment depressions in leads V5 and V6. There was excessive artifact with stress which limits interpretation. Defect 1: There is a large defect of moderate severity present in the mid anterior, mid anteroseptal, mid inferoseptal, apical anterior, apical septal and apical inferior location. Findings consistent with a large degree of ischemia.  This is a high risk study. Nuclear stress EF: 50%.     07/2017 echo Study Conclusions   - Left ventricle: The cavity size was normal. Wall thickness was   normal. Systolic function was normal. The estimated ejection   fraction was in the range of 60% to 65%. Wall motion was normal;   there were no regional wall motion abnormalities. Left   ventricular diastolic function parameters were normal. Doppler   parameters are consistent with indeterminate ventricular filling   pressure. - Aortic valve: There was mild regurgitation. - Mitral valve: There was mild regurgitation. - Right ventricle: Systolic function appeared grossly normal (RV   poorly visualized, I doubt accuracy of TAPSE). - Pericardium, extracardiac: There was a left pleural effusion.   Assessment and Plan  1. CAD - medical therapy limited by prior orthostatic symptoms and low bp's - denies any recent symptoms, continue current meds - EKG today shows NSR, no ischemic changes   2. Hyperlipidemia - at goal, continue current meds  3. Preoperative evaluation - ok to proceed with bladder tumor surgery from cardiac standpoint.       Antoine Poche, M.D.

## 2023-03-24 NOTE — Patient Instructions (Signed)
Chase Guerrero  03/24/2023     @PREFPERIOPPHARMACY @   Your procedure is scheduled on  03/31/2023.   Report to [facility] at  1100  A.M.   Call this number if you have problems the morning of surgery:  [phone]  If you experience any cold or flu symptoms such as cough, fever, chills, shortness of breath, etc. between now and your scheduled surgery, please notify us at the above number.   Remember:  Do not eat after midnight.   You may drink clear liquids until 0900 am on 03/31/2023.    Clear liquids allowed are:                    Water, Juice (No red color; non-citric and without pulp; diabetics please choose diet or no sugar options), Carbonated beverages (diabetics please choose diet or no sugar options), Clear Tea (No creamer, milk, or cream, including half & half and powdered creamer), Black Coffee Only (No creamer, milk or cream, including half & half and powdered creamer), and Clear Sports drink (No red color; diabetics please choose diet or no sugar options)    Take these medicines the morning of surgery with A SIP OF WATER                                                  None.    Do not wear jewelry, make-up or nail polish, including gel polish,  artificial nails, or any other type of covering on natural nails (fingers and  toes).  Do not wear lotions, powders, or perfumes, or deodorant.  Do not shave 48 hours prior to surgery.  Men may shave face and neck.  Do not bring valuables to the hospital.  [facility] is not responsible for any belongings or valuables.  Contacts, dentures or bridgework may not be worn into surgery.  Leave your suitcase in the car.  After surgery it may be brought to your room.  For patients admitted to the hospital, discharge time will be determined by your treatment team.  Patients discharged the day of surgery will not be allowed to drive home and must have someone with them for 24 hours.    Special instructions:   DO NOT  smoke tobacco or vape for 24 hours before your procedure.  Please read over the following fact sheets that you were given. 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 Your health care provider may also give you more specific instructions. If you have problems or questions, contact your health care provider. What can I expect after the procedure? After the procedure, it is common to have: A small amount of blood or small blood clots in your urine for up to 2 weeks. Soreness or mild pain from your catheter. After your catheter is removed, you may have mild soreness, especially when urinating. A need to urinate often. Pain in your lower abdomen. Follow these instructions at home: Medicines  Take over-the-counter and prescription medicines only as told by your health care provider. If you were prescribed an antibiotic medicine, take it as told by your health care provider. Do  not stop taking the antibiotic even if you start to feel better. Ask your health care provider if the medicine prescribed to you: Requires you to avoid driving or using machinery. Can cause constipation. You may need to take these actions to prevent or treat constipation: Drink enough fluid to keep your urine pale yellow. Take over-the-counter or prescription medicines. Eat foods that are high in fiber, such as beans, whole grains, and fresh fruits and vegetables. Limit foods that are high in fat and processed sugars, such as fried or sweet foods. Activity  If you were given a sedative during the procedure, it can affect you for several hours. Do not drive or operate machinery until your health care provider says that it is safe. Rest as told by your health care provider. Avoid sitting for a long time without moving.  Get up to take short walks every 1-2 hours. This is important to improve blood flow and breathing. Ask for help if you feel weak or unsteady. Do not lift anything that is heavier than 10 lb (4.5 kg), or the limit that you are told, until your health care provider says that it is safe. Avoid intense physical activity for as long as told by your health care provider. Do not have sex until your health care provider approves. Return to your normal activities as told by your health care provider. Ask your health care provider what activities are safe for you. General instructions If you have a catheter, follow instructions from your health care provider about caring for your catheter and your drainage bag. Do not drink alcohol for as long as told by your health care provider. This is especially important if you are taking prescription pain medicines. Do not use any products that contain nicotine or tobacco. These products include cigarettes, chewing tobacco, and vaping devices, such as e-cigarettes. If you need help quitting, ask your health care provider. Wear compression stockings as told by your health care provider. These stockings help to prevent blood clots and reduce swelling in your legs. Keep all follow-up visits. This is important. You will need to be followed closely with regular checks of your bladder and urethra (cystoscopies) to make sure that the cancer does not come back. Contact a health care provider if: You have blood in your urine for more than 2 weeks. You become constipated. Signs of constipation may include: Having fewer than three bowel movements in a week. Difficulty having a bowel movement. Stools that are dry, hard, or larger than normal. You have a urinary catheter in place, and you have: Spasms or pain. Problems with your catheter or your catheter is blocked. Your catheter has been taken out but you are unable to urinate. You have signs of infection, such as: Fever or  chills. Cloudy or bad-smelling urine. Get help right away if: You have severe abdominal pain that gets worse or does not improve with medicine. You have a lot of large blood clots in your urine. You develop swelling or pain in your leg. You have difficulty breathing. These symptoms may be an emergency. Get help right away. Call 911. Do not wait to see if the symptoms will go away. Do not drive yourself to the hospital. Summary After your procedure, it is common to have a small amount of blood or small blood clots in your urine, soreness or mild pain from your catheter, and pain in your lower abdomen. Take over-the-counter and prescription medicines only as told by your health care provider. Rest  as told by your health care provider. Follow your health care provider's instructions about returning to normal activities. Ask what activities are safe for you. If you have a catheter, follow instructions from your health care provider about caring for your catheter and your drainage bag. This information is not intended to replace advice given to you by your health care provider. Make sure you discuss any questions you have with your health care provider. Document Revised: 01/30/2021 Document Reviewed: 01/30/2021 Elsevier Patient Education  2024 [employer]General Anesthesia, Adult, Care After The following information offers guidance on how to care for yourself after your procedure. Your health care provider may also give you more specific instructions. If you have problems or questions, contact your health care provider. What can I expect after the procedure? After the procedure, it is common for people to: Have pain or discomfort at the IV site. Have nausea or vomiting. Have a sore throat or hoarseness. Have trouble concentrating. Feel cold or chills. Feel weak, sleepy, or tired (fatigue). Have soreness and body aches. These can affect parts of the body that were not involved in  surgery. Follow these instructions at home: For the time period you were told by your health care provider:  Rest. Do not participate in activities where you could fall or become injured. Do not drive or use machinery. Do not drink alcohol. Do not take sleeping pills or medicines that cause drowsiness. Do not make important decisions or sign legal documents. Do not take care of children on your own. General instructions Drink enough fluid to keep your urine pale yellow. If you have sleep apnea, surgery and certain medicines can increase your risk for breathing problems. Follow instructions from your health care provider about wearing your sleep device: Anytime you are sleeping, including during daytime naps. While taking prescription pain medicines, sleeping medicines, or medicines that make you drowsy. Return to your normal activities as told by your health care provider. Ask your health care provider what activities are safe for you. Take over-the-counter and prescription medicines only as told by your health care provider. Do not use any products that contain nicotine or tobacco. These products include cigarettes, chewing tobacco, and vaping devices, such as e-cigarettes. These can delay incision healing after surgery. If you need help quitting, ask your health care provider. Contact a health care provider if: You have nausea or vomiting that does not get better with medicine. You vomit every time you eat or drink. You have pain that does not get better with medicine. You cannot urinate or have bloody urine. You develop a skin rash. You have a fever. Get help right away if: You have trouble breathing. You have chest pain. You vomit blood. These symptoms may be an emergency. Get help right away. Call 911. Do not wait to see if the symptoms will go away. Do not drive yourself to the hospital. Summary After the procedure, it is common to have a sore throat, hoarseness, nausea,  vomiting, or to feel weak, sleepy, or fatigue. For the time period you were told by your health care provider, do not drive or use machinery. Get help right away if you have difficulty breathing, have chest pain, or vomit blood. These symptoms may be an emergency. This information is not intended to replace advice given to you by your health care provider. Make sure you discuss any questions you have with your health care provider. Document Revised: 04/24/2021 Document Reviewed: 04/24/2021 Elsevier Patient Education  2024 [employer]How  to Use Chlorhexidine at Home in the Shower Chlorhexidine gluconate (CHG) is a germ-killing (antiseptic) wash that's used to clean the skin. It can get rid of the germs that normally live on the skin and can keep them away for about 24 hours. If you're having surgery, you may be told to shower with CHG at home the night before surgery. This can help lower your risk for infection. To use CHG wash in the shower, follow the steps below. Supplies needed: CHG body wash. Clean washcloth. Clean towel. How to use CHG in the shower Follow these steps unless you're told to use CHG in a different way: Start the shower. Use your normal soap and shampoo to wash your face and hair. Turn off the shower or move out of the shower stream. Pour CHG onto a clean washcloth. Do not use any type of brush or rough sponge. Start at your neck, washing your body down to your toes. Make sure you: Wash the part of your body where the surgery will be done for at least 1 minute. Do not scrub. Do not use CHG on your head or face unless your health care provider tells you to. If it gets into your ears or eyes, rinse them well with water. Do not wash your genitals with CHG. Wash your back and under your arms. Make sure to wash skin folds. Let the CHG sit on your skin for 1-2 minutes or as long as told. Rinse your entire body in the shower, including all body creases and folds. Turn off the  shower. Dry off with a clean towel. Do not put anything on your skin afterward, such as powder, lotion, or perfume. Put on clean clothes or pajamas. If it's the night before surgery, sleep in clean sheets. General tips Use CHG only as told, and follow the instructions on the label. Use the full amount of CHG as told. This is often one bottle. Do not smoke and stay away from flames after using CHG. Your skin may feel sticky after using CHG. This is normal. The sticky feeling will go away as the CHG dries. Do not use CHG: If you have a chlorhexidine allergy or have reacted to chlorhexidine in the past. On open wounds or areas of skin that have broken skin, cuts, or scrapes. On babies younger than 45 months of age. Contact a health care provider if: You have questions about using CHG. Your skin gets irritated or itchy. You have a rash after using CHG. You swallow any CHG. Call your local poison control center [phone] in the [place] Your eyes itch badly, or they become very red or swollen. Your hearing changes. You have trouble seeing. If you can't reach your provider, go to an urgent care or emergency room. Do not drive yourself. Get help right away if: You have swelling or tingling in your mouth or throat. You make high-pitched whistling sounds when you breathe, most often when you breathe out (wheeze). You have trouble breathing. These symptoms may be an emergency. Call 911 right away. Do not wait to see if the symptoms will go away. Do not drive yourself to the hospital. This information is not intended to replace advice given to you by your health care provider. Make sure you discuss any questions you have with your health care provider. Document Revised: 08/10/2022 Document Reviewed: 08/06/2021 Elsevier Patient Education  2024 [employer].

## 2023-03-28 ENCOUNTER — Encounter (HOSPITAL_COMMUNITY)
Admission: RE | Admit: 2023-03-28 | Discharge: 2023-03-28 | Disposition: A | Discharge status: Home / Self Care | Payer: HMO | Source: Ambulatory Visit | Attending: Urology | Admitting: Urology

## 2023-03-28 DIAGNOSIS — Z01812 Encounter for preprocedural laboratory examination: Secondary | ICD-10-CM | POA: Insufficient documentation

## 2023-03-28 DIAGNOSIS — Z01818 Encounter for other preprocedural examination: Secondary | ICD-10-CM | POA: Diagnosis present

## 2023-03-28 LAB — CBC WITH DIFFERENTIAL/PLATELET
Abs Immature Granulocytes: 0.03 10*3/uL (ref 0.00–0.07)
Basophils Absolute: 0 10*3/uL (ref 0.0–0.1)
Basophils Relative: 0 %
Eosinophils Absolute: 0.2 10*3/uL (ref 0.0–0.5)
Eosinophils Relative: 3 %
HCT: 41.9 % (ref 39.0–52.0)
Hemoglobin: 13.8 g/dL (ref 13.0–17.0)
Immature Granulocytes: 0 %
Lymphocytes Relative: 39 %
Lymphs Abs: 3.2 10*3/uL (ref 0.7–4.0)
MCH: 29.2 pg (ref 26.0–34.0)
MCHC: 32.9 g/dL (ref 30.0–36.0)
MCV: 88.6 fL (ref 80.0–100.0)
Monocytes Absolute: 0.5 10*3/uL (ref 0.1–1.0)
Monocytes Relative: 6 %
Neutro Abs: 4.3 10*3/uL (ref 1.7–7.7)
Neutrophils Relative %: 52 %
Platelets: 153 10*3/uL (ref 150–400)
RBC: 4.73 MIL/uL (ref 4.22–5.81)
RDW: 13.8 % (ref 11.5–15.5)
WBC: 8.3 10*3/uL (ref 4.0–10.5)
nRBC: 0 % (ref 0.0–0.2)

## 2023-03-30 MED ORDER — GEMCITABINE CHEMO FOR BLADDER INSTILLATION 2000 MG: 2000.0000 mg | Freq: Once | INTRAVENOUS | Status: DC

## 2023-03-30 MED ORDER — GEMCITABINE CHEMO FOR BLADDER INSTILLATION 2000 MG
2000.0000 mg | Freq: Once | INTRAVENOUS | Status: DC
  Filled 2023-03-30: qty 52.6

## 2023-03-31 ENCOUNTER — Observation Stay (HOSPITAL_COMMUNITY)
Admission: RE | Admit: 2023-03-31 | Discharge: 2023-04-02 | Disposition: A | Discharge status: Home / Self Care | Payer: HMO | Attending: Urology | Admitting: Urology

## 2023-03-31 ENCOUNTER — Encounter (HOSPITAL_COMMUNITY): Payer: Self-pay | Admitting: Urology

## 2023-03-31 ENCOUNTER — Encounter (HOSPITAL_COMMUNITY)
Admission: RE | Disposition: A | Discharge status: Home / Self Care | Payer: Self-pay | Source: Home / Self Care | Attending: Urology

## 2023-03-31 ENCOUNTER — Ambulatory Visit (HOSPITAL_BASED_OUTPATIENT_CLINIC_OR_DEPARTMENT_OTHER): Payer: HMO | Admitting: Anesthesiology

## 2023-03-31 ENCOUNTER — Ambulatory Visit (HOSPITAL_COMMUNITY): Payer: HMO | Admitting: Anesthesiology

## 2023-03-31 ENCOUNTER — Other Ambulatory Visit: Payer: Self-pay

## 2023-03-31 DIAGNOSIS — D494 Neoplasm of unspecified behavior of bladder: Principal | ICD-10-CM

## 2023-03-31 DIAGNOSIS — C679 Malignant neoplasm of bladder, unspecified: Secondary | ICD-10-CM | POA: Diagnosis not present

## 2023-03-31 DIAGNOSIS — Z87891 Personal history of nicotine dependence: Secondary | ICD-10-CM | POA: Insufficient documentation

## 2023-03-31 DIAGNOSIS — I10 Essential (primary) hypertension: Secondary | ICD-10-CM | POA: Insufficient documentation

## 2023-03-31 DIAGNOSIS — I4891 Unspecified atrial fibrillation: Secondary | ICD-10-CM | POA: Diagnosis not present

## 2023-03-31 DIAGNOSIS — D090 Carcinoma in situ of bladder: Secondary | ICD-10-CM | POA: Diagnosis not present

## 2023-03-31 DIAGNOSIS — Z951 Presence of aortocoronary bypass graft: Secondary | ICD-10-CM | POA: Insufficient documentation

## 2023-03-31 DIAGNOSIS — Z79899 Other long term (current) drug therapy: Secondary | ICD-10-CM | POA: Diagnosis not present

## 2023-03-31 DIAGNOSIS — Z85828 Personal history of other malignant neoplasm of skin: Secondary | ICD-10-CM | POA: Insufficient documentation

## 2023-03-31 DIAGNOSIS — I2510 Atherosclerotic heart disease of native coronary artery without angina pectoris: Secondary | ICD-10-CM | POA: Diagnosis not present

## 2023-03-31 DIAGNOSIS — R310 Gross hematuria: Secondary | ICD-10-CM | POA: Insufficient documentation

## 2023-03-31 DIAGNOSIS — R350 Frequency of micturition: Secondary | ICD-10-CM | POA: Insufficient documentation

## 2023-03-31 DIAGNOSIS — N401 Enlarged prostate with lower urinary tract symptoms: Secondary | ICD-10-CM | POA: Insufficient documentation

## 2023-03-31 HISTORY — PX: BLADDER INSTILLATION: SHX6893

## 2023-03-31 HISTORY — PX: TRANSURETHRAL RESECTION OF BLADDER TUMOR: SHX2575

## 2023-03-31 LAB — BASIC METABOLIC PANEL
Anion gap: 10 (ref 5–15)
BUN: 15 mg/dL (ref 8–23)
CO2: 23 mmol/L (ref 22–32)
Calcium: 8.5 mg/dL — ABNORMAL LOW (ref 8.9–10.3)
Chloride: 100 mmol/L (ref 98–111)
Creatinine, Ser: 1.43 mg/dL — ABNORMAL HIGH (ref 0.61–1.24)
GFR, Estimated: 50 mL/min — ABNORMAL LOW (ref >60)
Glucose, Bld: 237 mg/dL — ABNORMAL HIGH (ref 70–99)
Potassium: 4 mmol/L (ref 3.5–5.1)
Sodium: 133 mmol/L — ABNORMAL LOW (ref 135–145)

## 2023-03-31 LAB — CBC
HCT: 45.2 % (ref 39.0–52.0)
Hemoglobin: 14.5 g/dL (ref 13.0–17.0)
MCH: 28.5 pg (ref 26.0–34.0)
MCHC: 32.1 g/dL (ref 30.0–36.0)
MCV: 88.8 fL (ref 80.0–100.0)
Platelets: 161 10*3/uL (ref 150–400)
RBC: 5.09 MIL/uL (ref 4.22–5.81)
RDW: 13.8 % (ref 11.5–15.5)
WBC: 9.9 10*3/uL (ref 4.0–10.5)
nRBC: 0 % (ref 0.0–0.2)

## 2023-03-31 SURGERY — TURBT (TRANSURETHRAL RESECTION OF BLADDER TUMOR)
Anesthesia: General | Site: Bladder

## 2023-03-31 MED ORDER — HYDROCODONE-ACETAMINOPHEN 5-325 MG PO TABS
1.0000 | ORAL_TABLET | ORAL | Status: DC | PRN
  Administered 2023-03-31 – 2023-04-01 (×2): 2 via ORAL
  Filled 2023-03-31 (×2): qty 2

## 2023-03-31 MED ORDER — FINASTERIDE 5 MG PO TABS
5.0000 mg | ORAL_TABLET | Freq: Every morning | ORAL | Status: DC
  Administered 2023-04-01 – 2023-04-02 (×2): 5 mg via ORAL
  Filled 2023-03-31 (×2): qty 1

## 2023-03-31 MED ORDER — CHLORHEXIDINE GLUCONATE 0.12 % MT SOLN
15.0000 mL | Freq: Once | OROMUCOSAL | Status: DC
Start: 2023-03-31 — End: 2023-03-31

## 2023-03-31 MED ORDER — DEXAMETHASONE SODIUM PHOSPHATE 10 MG/ML IJ SOLN
INTRAMUSCULAR | Status: AC
  Filled 2023-03-31: qty 1

## 2023-03-31 MED ORDER — ROSUVASTATIN CALCIUM 20 MG PO TABS
40.0000 mg | ORAL_TABLET | Freq: Every day | ORAL | Status: DC
  Administered 2023-03-31 – 2023-04-02 (×3): 40 mg via ORAL
  Filled 2023-03-31 (×3): qty 2

## 2023-03-31 MED ORDER — GEMCITABINE CHEMO FOR BLADDER INSTILLATION 2000 MG
INTRAVENOUS | Status: DC | PRN
  Administered 2023-03-31: 2000 mg via INTRAVESICAL

## 2023-03-31 MED ORDER — LACTATED RINGERS IV SOLN: INTRAVENOUS | Status: DC

## 2023-03-31 MED ORDER — SODIUM CHLORIDE 0.9 % IR SOLN
3000.0000 mL | Status: DC
  Administered 2023-04-01: 3000 mL

## 2023-03-31 MED ORDER — ACETAMINOPHEN 325 MG PO TABS
650.0000 mg | ORAL_TABLET | ORAL | Status: DC | PRN
  Administered 2023-04-02: 650 mg via ORAL
  Filled 2023-03-31: qty 2

## 2023-03-31 MED ORDER — ONDANSETRON HCL 4 MG/2ML IJ SOLN
INTRAMUSCULAR | Status: DC | PRN
  Administered 2023-03-31: 4 mg via INTRAVENOUS

## 2023-03-31 MED ORDER — FENTANYL CITRATE (PF) 100 MCG/2ML IJ SOLN
INTRAMUSCULAR | Status: AC
  Filled 2023-03-31: qty 2

## 2023-03-31 MED ORDER — CHLORHEXIDINE GLUCONATE 0.12 % MT SOLN
15.0000 mL | Freq: Once | OROMUCOSAL | Status: AC
  Administered 2023-03-31: 15 mL via OROMUCOSAL
  Filled 2023-03-31: qty 15

## 2023-03-31 MED ORDER — DEXAMETHASONE SODIUM PHOSPHATE 10 MG/ML IJ SOLN
INTRAMUSCULAR | Status: DC | PRN
  Administered 2023-03-31: 10 mg via INTRAVENOUS

## 2023-03-31 MED ORDER — FENTANYL CITRATE (PF) 100 MCG/2ML IJ SOLN
INTRAMUSCULAR | Status: DC | PRN
Start: 2023-03-31 — End: 2023-03-31
  Administered 2023-03-31: 100 ug via INTRAVENOUS

## 2023-03-31 MED ORDER — EZETIMIBE 10 MG PO TABS
10.0000 mg | ORAL_TABLET | Freq: Every day | ORAL | Status: DC
  Administered 2023-03-31 – 2023-04-02 (×3): 10 mg via ORAL
  Filled 2023-03-31 (×3): qty 1

## 2023-03-31 MED ORDER — LISINOPRIL 5 MG PO TABS
2.5000 mg | ORAL_TABLET | Freq: Every day | ORAL | Status: DC
  Administered 2023-03-31 – 2023-04-02 (×3): 2.5 mg via ORAL
  Filled 2023-03-31 (×3): qty 1

## 2023-03-31 MED ORDER — OXYCODONE HCL 5 MG PO TABS
5.0000 mg | ORAL_TABLET | Freq: Once | ORAL | Status: AC | PRN
  Administered 2023-03-31: 5 mg via ORAL
  Filled 2023-03-31: qty 1

## 2023-03-31 MED ORDER — DIPHENHYDRAMINE HCL 12.5 MG/5ML PO ELIX: 12.5000 mg | ORAL_SOLUTION | Freq: Four times a day (QID) | ORAL | Status: DC | PRN

## 2023-03-31 MED ORDER — ORAL CARE MOUTH RINSE: 15.0000 mL | Freq: Once | OROMUCOSAL | Status: DC

## 2023-03-31 MED ORDER — ONDANSETRON HCL 4 MG/2ML IJ SOLN
INTRAMUSCULAR | Status: AC
  Filled 2023-03-31: qty 2

## 2023-03-31 MED ORDER — PROPOFOL 10 MG/ML IV BOLUS
INTRAVENOUS | Status: DC | PRN
  Administered 2023-03-31: 150 mg via INTRAVENOUS

## 2023-03-31 MED ORDER — OXYCODONE HCL 5 MG/5ML PO SOLN: 5.0000 mg | Freq: Once | ORAL | Status: AC | PRN

## 2023-03-31 MED ORDER — CEFAZOLIN SODIUM-DEXTROSE 2-4 GM/100ML-% IV SOLN
2.0000 g | INTRAVENOUS | Status: AC
  Administered 2023-03-31: 2 g via INTRAVENOUS
  Filled 2023-03-31: qty 100

## 2023-03-31 MED ORDER — ONDANSETRON HCL 4 MG/2ML IJ SOLN: 4.0000 mg | Freq: Once | INTRAMUSCULAR | Status: DC | PRN

## 2023-03-31 MED ORDER — PHENYLEPHRINE 80 MCG/ML (10ML) SYRINGE FOR IV PUSH (FOR BLOOD PRESSURE SUPPORT)
PREFILLED_SYRINGE | INTRAVENOUS | Status: DC | PRN
  Administered 2023-03-31 (×2): 160 ug via INTRAVENOUS

## 2023-03-31 MED ORDER — ASPIRIN 81 MG PO TBEC
81.0000 mg | DELAYED_RELEASE_TABLET | ORAL | Status: DC
  Administered 2023-04-01: 81 mg via ORAL
  Filled 2023-03-31: qty 1

## 2023-03-31 MED ORDER — DIPHENHYDRAMINE HCL 50 MG/ML IJ SOLN: 12.5000 mg | Freq: Four times a day (QID) | INTRAMUSCULAR | Status: DC | PRN

## 2023-03-31 MED ORDER — ALFUZOSIN HCL ER 10 MG PO TB24
10.0000 mg | ORAL_TABLET | Freq: Every day | ORAL | Status: DC
  Administered 2023-04-01 – 2023-04-02 (×2): 10 mg via ORAL
  Filled 2023-03-31 (×2): qty 1

## 2023-03-31 MED ORDER — ONDANSETRON HCL 4 MG/2ML IJ SOLN
4.0000 mg | INTRAMUSCULAR | Status: DC | PRN
  Administered 2023-03-31: 4 mg via INTRAVENOUS
  Filled 2023-03-31: qty 2

## 2023-03-31 MED ORDER — PHENYLEPHRINE 80 MCG/ML (10ML) SYRINGE FOR IV PUSH (FOR BLOOD PRESSURE SUPPORT)
PREFILLED_SYRINGE | INTRAVENOUS | Status: AC
  Filled 2023-03-31: qty 10

## 2023-03-31 MED ORDER — PROPOFOL 10 MG/ML IV BOLUS
INTRAVENOUS | Status: AC
  Filled 2023-03-31: qty 20

## 2023-03-31 MED ORDER — LACTATED RINGERS IV SOLN: INTRAVENOUS | Status: DC | PRN

## 2023-03-31 MED ORDER — SODIUM CHLORIDE 0.9 % IR SOLN
Status: DC | PRN
  Administered 2023-03-31: 3000 mL via INTRAVESICAL

## 2023-03-31 MED ORDER — HYDROCODONE-ACETAMINOPHEN 5-325 MG PO TABS: 1.0000 | ORAL_TABLET | Freq: Four times a day (QID) | ORAL | 0 refills | Status: AC | PRN

## 2023-03-31 MED ORDER — FENTANYL CITRATE PF 50 MCG/ML IJ SOSY
25.0000 ug | PREFILLED_SYRINGE | INTRAMUSCULAR | Status: DC | PRN
  Administered 2023-03-31: 50 ug via INTRAVENOUS
  Filled 2023-03-31: qty 1

## 2023-03-31 MED ORDER — OXYBUTYNIN CHLORIDE 5 MG PO TABS: 5.0000 mg | ORAL_TABLET | Freq: Three times a day (TID) | ORAL | Status: DC | PRN

## 2023-03-31 MED ORDER — STERILE WATER FOR IRRIGATION IR SOLN
Status: DC | PRN
  Administered 2023-03-31: 1000 mL

## 2023-03-31 MED ORDER — FENTANYL CITRATE PF 50 MCG/ML IJ SOSY
25.0000 ug | PREFILLED_SYRINGE | INTRAMUSCULAR | Status: DC | PRN
Start: 2023-03-31 — End: 2023-03-31
  Administered 2023-03-31 (×2): 25 ug via INTRAVENOUS
  Filled 2023-03-31: qty 1

## 2023-03-31 MED ORDER — SODIUM CHLORIDE 0.9 % IV SOLN: INTRAVENOUS | Status: AC

## 2023-03-31 MED ORDER — ZOLPIDEM TARTRATE 5 MG PO TABS: 5.0000 mg | ORAL_TABLET | Freq: Every evening | ORAL | Status: DC | PRN

## 2023-03-31 SURGICAL SUPPLY — 23 items
BAG DRAIN URO TABLE W/ADPT NS (BAG) ×2 IMPLANT
BAG HAMPER (MISCELLANEOUS) ×2 IMPLANT
BAG URINE DRAIN 2000ML AR STRL (UROLOGICAL SUPPLIES) ×2 IMPLANT
CATH FOLEY 3WAY 30CC 22FR (CATHETERS) IMPLANT
CLOTH BEACON ORANGE TIMEOUT ST (SAFETY) ×2 IMPLANT
ELECT LOOP 22F BIPOLAR SML (ELECTROSURGICAL) ×1 IMPLANT
ELECTRODE LOOP 22F BIPOLAR SML (ELECTROSURGICAL) ×2 IMPLANT
GLOVE BIO SURGEON STRL SZ8 (GLOVE) ×2 IMPLANT
GLOVE BIOGEL PI IND STRL 7.0 (GLOVE) ×4 IMPLANT
GOWN STRL REUS W/TWL LRG LVL3 (GOWN DISPOSABLE) ×2 IMPLANT
GOWN STRL REUS W/TWL XL LVL3 (GOWN DISPOSABLE) ×2 IMPLANT
IV NS IRRIG 3000ML ARTHROMATIC (IV SOLUTION) ×4 IMPLANT
KIT CHEMO SPILL (MISCELLANEOUS) ×2 IMPLANT
KIT TURNOVER CYSTO (KITS) ×2 IMPLANT
PACK CYSTO (CUSTOM PROCEDURE TRAY) ×2 IMPLANT
PAD ARMBOARD 7.5X6 YLW CONV (MISCELLANEOUS) ×2 IMPLANT
PLUG CATH AND CAP STER (CATHETERS) IMPLANT
PLUG CATH AND CAP STRL 200 (CATHETERS) IMPLANT
POSITIONER HEAD 8X9X4 ADT (SOFTGOODS) ×2 IMPLANT
SYR 30ML LL (SYRINGE) ×2 IMPLANT
SYR TOOMEY IRRIG 70ML (MISCELLANEOUS) ×1 IMPLANT
SYRINGE TOOMEY IRRIG 70ML (MISCELLANEOUS) ×2 IMPLANT
TOWEL OR 17X26 4PK STRL BLUE (TOWEL DISPOSABLE) ×2 IMPLANT

## 2023-03-31 NOTE — Progress Notes (Signed)
 At 1615 patients foley noted to be leaking around the tip of patients penis. Called on call Dr. Annabell Howells and he advised to flush it with normal saline, I did flush  it and pull back on syringe and there were multiple blood clots noted. Flushed 2 more times and then irrigation was noted to be delivering adequate fluid to foley bag , it is still red in color so CBI left at fast rate, have emptied the foley twice it is still a little red , will reassess and adjust rate as needed. Patient given Prn fentanyl for c/o pain in back and abdomen, family at bedside to visit updated on plan of care for patient,

## 2023-03-31 NOTE — H&P (Signed)
 HPI: This 78 year old male here for bladder tumor resection.  He has had CT hematuria protocol for evaluation of hematuria.  This did reveal enhancing 11 mm bladder lesion.  Also a 14 mm enhancing nodular focus on the median lobe.  He does have a history of elevated PSA with negative biopsy x 2 in the past.           PMH:     Past Medical History:  Diagnosis Date   Arthritis     Atrial fibrillation (HCC)     Cancer (HCC)      skin   Coronary artery disease     Enlarged prostate     GERD (gastroesophageal reflux disease)     Hypertension     Myocardial infarction (HCC)      mild   Pleural effusion     Sleep apnea      not wearing  cpap  not worn in 9-10 yrs          Surgical History:      Past Surgical History:  Procedure Laterality Date   BIOPSY   01/04/2022    Procedure: BIOPSY;  Surgeon: Corbin Ade, MD;  [facility];  Service: Endoscopy;;   CHEST TUBE INSERTION Left 08/16/2017    Procedure: INSERTION PLEURAL DRAINAGE CATHETER;  Surgeon: Kerin Perna, MD;  Location: [facility] OR;  Service: Thoracic;  Laterality: Left;   COLONOSCOPY N/A 07/23/2015    Procedure: COLONOSCOPY;  Surgeon: Corbin Ade, MD;  [facility];  Service: Endoscopy;  Laterality: N/A;  10:30 Am   COLONOSCOPY N/A 08/27/2020    Procedure: COLONOSCOPY;  Surgeon: Corbin Ade, MD;  [facility];  Service: Endoscopy;  Laterality: N/A;  10:30am   CORONARY ARTERY BYPASS GRAFT N/A 06/27/2017    Procedure: CORONARY ARTERY BYPASS GRAFTING (CABG) x 2  WITH ENDOSCOPIC HARVESTING OF RIGHT SAPHENOUS VEIN;  Surgeon: Kerin Perna, MD;  Location: [facility] OR;  Service: Open Heart Surgery;  Laterality: N/A;   ESOPHAGOGASTRODUODENOSCOPY (EGD) WITH PROPOFOL N/A 01/04/2022    Procedure: ESOPHAGOGASTRODUODENOSCOPY (EGD) WITH PROPOFOL;  Surgeon: Corbin Ade, MD;  [facility];  Service: Endoscopy;  Laterality: N/A;  12:15 PM, pt knows to arrive at 7:15   IR THORACENTESIS ASP  PLEURAL SPACE W/IMG GUIDE   08/08/2017   LEFT HEART CATH AND CORONARY ANGIOGRAPHY N/A 06/14/2017    Procedure: LEFT HEART CATH AND CORONARY ANGIOGRAPHY;  Surgeon: Marykay Lex, MD;  Location: [facility] INVASIVE CV LAB;  Service: Cardiovascular;  Laterality: N/A;   MALONEY DILATION N/A 01/04/2022    Procedure: Elease Hashimoto DILATION;  Surgeon: Corbin Ade, MD;  [facility];  Service: Endoscopy;  Laterality: N/A;   POLYPECTOMY   08/27/2020    Procedure: POLYPECTOMY;  Surgeon: Corbin Ade, MD;  [facility];  Service: Endoscopy;;  cecal x2   REMOVAL OF PLEURAL DRAINAGE CATHETER Left 10/13/2017    Procedure: REMOVAL OF PLEURAL DRAINAGE CATHETER;  Surgeon: Kerin Perna, MD;  Location: [facility] OR;  Service: Thoracic;  Laterality: Left;   Right hand surgery       Right knee arthroscopy       TEE WITHOUT CARDIOVERSION N/A 06/27/2017    Procedure: TRANSESOPHAGEAL ECHOCARDIOGRAM (TEE);  Surgeon: Donata Clay, Theron Arista, MD;  Location: [facility] OR;  Service: Open Heart Surgery;  Laterality: N/A;          Home Medications:  Allergies as of 02/22/2023   No Known  Allergies         Medication List           Accurate as of February 21, 2023 11:30 AM. If you have any questions, ask your nurse or doctor.              acetaminophen 500 MG tablet Commonly known as: TYLENOL Take 1,000 mg by mouth every 6 (six) hours as needed for moderate pain.    alfuzosin 10 MG 24 hr tablet Commonly known as: UROXATRAL Take 1 tablet (10 mg total) by mouth at bedtime.    aspirin EC 81 MG tablet Take 81 mg by mouth in the morning.    Cannabidiol Powd Apply 1 application topically 3 (three) times daily as needed (pain.). CBD cream    ezetimibe 10 MG tablet Commonly known as: ZETIA Take 10 mg by mouth daily.    finasteride 5 MG tablet Commonly known as: PROSCAR Take 5 mg by mouth in the morning.    fluticasone 50 MCG/ACT nasal spray Commonly known as: FLONASE Place 1 spray into both nostrils daily as  needed for allergies or rhinitis.    lisinopril 2.5 MG tablet Commonly known as: ZESTRIL TAKE ONE TABLET (2.5MG  TOTAL) BY MOUTH DAILY    nitrofurantoin (macrocrystal-monohydrate) 100 MG capsule Commonly known as: MACROBID Take 1 capsule (100 mg total) by mouth every 12 (twelve) hours.    nitroGLYCERIN 0.4 MG SL tablet Commonly known as: NITROSTAT Place 1 tablet (0.4 mg total) under the tongue every 5 (five) minutes x 3 doses as needed for chest pain.    rosuvastatin 40 MG tablet Commonly known as: CRESTOR TAKE ONE TABLET (40MG  TOTAL) BY MOUTH DAILY    Sensitive Eyes Saline Soln Place 1 drop into both eyes 3 (three) times daily.    sulfamethoxazole-trimethoprim 800-160 MG tablet Commonly known as: BACTRIM DS Take 1 tablet by mouth every 12 (twelve) hours.             Allergies:  Allergies  No Known Allergies     Family History:      Family History  Problem Relation Age of Onset   Alzheimer's disease Mother     Arthritis Sister     Heart attack Brother     Hypercholesterolemia Sister     Liver disease Sister            Social History:  reports that he quit smoking about 35 years ago. His smoking use included cigarettes. He started smoking about 65 years ago. He has a 15 pack-year smoking history. He has quit using smokeless tobacco.  His smokeless tobacco use included snuff. He reports that he does not drink alcohol and does not use drugs.   ROS: All other review of systems were reviewed and are negative except what is noted above in HPI   Physical Exam: There were no vitals taken for this visit.  Constitutional:  Alert and oriented, No acute distress. HEENT: [place] AT, moist mucus membranes.  Trachea midline, no masses. Cardiovascular: No clubbing, cyanosis, or edema. Respiratory: Normal respiratory effort, no increased work of breathing. Skin: No rashes, bruises or suspicious lesions. Neurologic: Grossly intact, no focal deficits, moving all 4  extremities. Psychiatric: Normal mood and affect.   Laboratory Data: Recent Labs       Lab Results  Component Value Date    WBC 7.9 08/16/2017    HGB 12.9 (L) 08/16/2017    HCT 43.8 08/16/2017    MCV 89.8 08/16/2017    PLT  348 08/16/2017        Recent Labs       Lab Results  Component Value Date    CREATININE 1.50 (H) 02/17/2023        Recent Labs  No results found for: "PSA"     Recent Labs  No results found for: "TESTOSTERONE"     Recent Labs       Lab Results  Component Value Date    HGBA1C 5.9 (H) 06/24/2017        Urinalysis Labs (Brief)          Component Value Date/Time    COLORURINE YELLOW 06/24/2017 0919    APPEARANCEUR Clear 01/04/2023 1341    LABSPEC 1.013 06/24/2017 0919    PHURINE 5.0 06/24/2017 0919    GLUCOSEU Negative 01/04/2023 1341    HGBUR MODERATE (A) 06/24/2017 0919    BILIRUBINUR Negative 01/04/2023 1341    KETONESUR NEGATIVE 06/24/2017 0919    PROTEINUR Negative 01/04/2023 1341    PROTEINUR NEGATIVE 06/24/2017 0919    NITRITE Negative 01/04/2023 1341    NITRITE NEGATIVE 06/24/2017 0919    LEUKOCYTESUR Negative 01/04/2023 1341        Recent Labs       Lab Results  Component Value Date    LABMICR See below: 01/04/2023    WBCUA 6-10 (A) 01/04/2023    LABEPIT 0-10 01/04/2023    MUCUS Present (A) 01/04/2023    BACTERIA Few (A) 01/04/2023        Pertinent Imaging:     No results found for this or any previous visit.   Results for orders placed during the hospital encounter of 02/17/23   CT HEMATURIA WORKUP   Narrative CLINICAL DATA:  Gross hematuria * Tracking Code: BO *   EXAM: CT ABDOMEN AND PELVIS WITHOUT AND WITH CONTRAST   TECHNIQUE: Multidetector CT imaging of the abdomen and pelvis was performed following the standard protocol before and following the bolus administration of intravenous contrast.   RADIATION DOSE REDUCTION: This exam was performed according to the departmental dose-optimization  program which includes automated exposure control, adjustment of the mA and/or kV according to patient size and/or use of iterative reconstruction technique.   CONTRAST:  OMNIPAQUE IOHEXOL 300 MG/ML  SOLN   COMPARISON:  CT February 09, 2022   FINDINGS: Lower chest: Bibasilar atelectasis/scarring. Prior median sternotomy. Coronary artery calcifications.   Hepatobiliary: Subcentimeter segment IV hepatic hypodensity on image 16/2 is technically too small to accurately characterize. Gallbladder is unremarkable. No biliary ductal dilation.   Pancreas: No pancreatic ductal dilation or evidence of acute inflammation. 13 mm nodule in the tail of the pancreas on image 27/6 follows spleen on all 3 phases of acquired imaging and is favored to reflect a splenule.   Spleen: No splenomegaly or focal splenic lesion.   Adrenals/Urinary Tract: Bilateral adrenal glands appear normal.   No hydronephrosis. No renal, ureteral or bladder calculi. Bilateral fluid signal renal cysts. Additional tiny hypodense renal lesions technically too small to accurately characterize but statistically likely to reflect cysts.   Kidneys demonstrate symmetric enhancement and excretion of contrast material. No collecting system duplication. No suspicious filling defect identified within the opacified portions of the collecting systems or ureters on delayed imaging.   Effacement of the urinary bladder by an enlarged prostate gland. Enhancing endophytic bladder wall mass in the anterior bladder wall measuring 11 mm on image 71/11.   Stomach/Bowel: Stomach is unremarkable for degree of distension. Normal appendix. Colonic diverticulosis. No  evidence of bowel obstruction or acute bowel inflammation.   Vascular/Lymphatic: Aortic atherosclerosis. Normal caliber abdominal aorta. Smooth IVC contours. The portal, splenic and superior mesenteric veins are patent. No pathologically enlarged abdominal or pelvic lymph  nodes.   Reproductive: Enlarged prostate gland with median lobe hypertrophy in an enhancing nodular focus along the superior aspect of the median lobe measuring 14 mm on image 78/6.   Other: No significant abdominopelvic free fluid.   Musculoskeletal: No aggressive lytic or blastic lesion of bone. 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   IMPRESSION: 1. Enhancing 11 mm endophytic anterior bladder wall mass, highly suspicious for primary bladder neoplasm. Suggest urology consultation and further evaluation with cystoscopy. 2. Enlarged prostate gland with median lobe hypertrophy and an enhancing nodular focus along the superior aspect of the median lobe measuring 14 mm. Suggest attention at cystoscopy and correlation with PSA with consideration for consider further evaluation by prostate MRI. 3. No hydronephrosis. No renal, ureteral or bladder calculi. 4. Subcentimeter segment IV hepatic hypodensity is technically too small to accurately characterize. Suggest attention on follow-up imaging. 5. 13 mm nodule in the tail of the pancreas follows spleen on all 3 phases of acquired imaging and is favored to reflect a splenule. 6. Colonic diverticulosis. 7. 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 8.  Aortic Atherosclerosis (ICD10-I70.0).     Electronically Signed By: Maudry Mayhew M.D. On: 02/19/2023 13:09           Assessment &    1. Gross hematuria (Primary) This is most likely due to his bladder lesion   2. Benign prostatic hyperplasia with urinary frequency Fairly symptomatic.  He does have moderate obstruction on cystoscopy.  It does appear that he empties well, however.   3.  History of elevated PSA with biopsy negative x 2.  He does have a nodular abnormality on his CT scan.   Plan:   1.  I discussed TURBT/gemcitabine administration with him.  He would prefer to have this done in [place].

## 2023-03-31 NOTE — Op Note (Signed)
.  Preoperative diagnosis: bladder tumor  Postoperative diagnosis: Same  Procedure: 1 cystoscopy 2. Transurethral resection of bladder tumor, medium 3. Instillation of bladder chemotherapy agent  Attending: Cleda Mccreedy  Anesthesia: General  Estimated blood loss: Minimal  Drains: 22 French foley  Specimens: bladdewr tumor  Antibiotics: ancef  Findings: 3.5cm papillary dome tumor and 14mm papillary posterior wall tumor.  Ureteral orifices in normal anatomic location.   Indications: Patient is a 78 year old male with a history of bladder tumor and gross hematuria.  After discussing treatment options, they decided proceed with transurethral resection of a bladder tumor.  Procedure in detail: The patient was brought to the operating room and a brief timeout was done to ensure correct patient, correct procedure, correct site.  General anesthesia was administered patient was placed in dorsal lithotomy position.  Their genitalia was then prepped and draped in usual sterile fashion.  A rigid 22 French cystoscope was passed in the urethra and the bladder.  Bladder was inspected and we noted two tumors 3.5cm and 14mm.  the ureteral orifices were in the normal orthotopic locations. Using the bipolar resectoscope we removed the bladder tumor down to the base. Hemostasis was then obtained with electrocautery. We then removed the bladder tumor chips and sent them for pathology. We then re-inspected the bladder and found no residual bleeding.  the bladder was then drained, a 22 French foley was placed and 2g of gemcitabine was instilled into the bladder. This concluded the procedure which was well tolerated by patient.  Complications: None  Condition: Stable, extubated, transferred to PACU  Plan: Patient is to have the gemcitabine drained in 1 hour. IHe will be discharged home and followup in 7 days for foley catheter removal and pathology discussion.

## 2023-03-31 NOTE — Progress Notes (Signed)
 Bladder irrigation start wide open NS. Foley catheter draining bloody urine with small clots.  Will continue to monitor output.

## 2023-03-31 NOTE — Progress Notes (Signed)
 Will page on call physician

## 2023-03-31 NOTE — TOC CM/SW Note (Signed)
 Transition of Care [facility]) - Inpatient Brief Assessment   Patient Details  Name: Chase Guerrero MRN: [id number] Date of Birth: [birth year]  Transition of Care [facility]) CM/SW Contact:    Beather Arbour Phone Number: 03/31/2023, 3:09 PM   Clinical Narrative:  Added resources regarding SDOH flag for financial barriers.   Transition of Care Department [facility]) has reviewed patient and no TOC needs have been identified at this time. We will continue to monitor patient advancement through interdisciplinary progression rounds. If new patient transition needs arise, please place a TOC consult.    Transition of Care Asessment: Insurance and Status: Insurance coverage has been reviewed Patient has primary care physician: Yes Home environment has been reviewed: Single Family Home with Spouse Prior level of function:: Independent Prior/Current Home Services: No current home services Social Drivers of Health Review: SDOH reviewed interventions complete (Added resources to AVS) Readmission risk has been reviewed: Yes Transition of care needs: no transition of care needs at this time

## 2023-03-31 NOTE — Progress Notes (Signed)
 Dr. Ronne Binning here to see pt,  irrigated foley catheter with NS.   Orders received to keep pt overnight and to start bladder irrigation.

## 2023-03-31 NOTE — Anesthesia Preprocedure Evaluation (Signed)
 Anesthesia Evaluation  Patient identified by MRN, date of birth, ID band Patient awake    Reviewed: Allergy & Precautions, H&P , NPO status , Patient's Chart, lab work & pertinent test results, reviewed documented beta blocker date and time   Airway Mallampati: II  TM Distance: >3 FB Neck ROM: full    Dental no notable dental hx.    Pulmonary sleep apnea , former smoker   Pulmonary exam normal breath sounds clear to auscultation       Cardiovascular Exercise Tolerance: Good hypertension, + angina  + CAD and + Past MI   Rhythm:regular Rate:Normal     Neuro/Psych negative neurological ROS  negative psych ROS   GI/Hepatic Neg liver ROS,GERD  ,,  Endo/Other  negative endocrine ROS    Renal/GU negative Renal ROS  negative genitourinary   Musculoskeletal   Abdominal   Peds  Hematology negative hematology ROS (+)   Anesthesia Other Findings   Reproductive/Obstetrics negative OB ROS                             Anesthesia Physical Anesthesia Plan  ASA: 2  Anesthesia Plan: General and General LMA   Post-op Pain Management:    Induction:   PONV Risk Score and Plan: Ondansetron  Airway Management Planned:   Additional Equipment:   Intra-op Plan:   Post-operative Plan:   Informed Consent: I have reviewed the patients History and Physical, chart, labs and discussed the procedure including the risks, benefits and alternatives for the proposed anesthesia with the patient or authorized representative who has indicated his/her understanding and acceptance.     Dental Advisory Given  Plan Discussed with: CRNA  Anesthesia Plan Comments:        Anesthesia Quick Evaluation

## 2023-03-31 NOTE — Anesthesia Procedure Notes (Signed)
 Procedure Name: LMA Insertion Date/Time: 03/31/2023 7:50 AM  Performed by: Shanon Payor, CRNAPre-anesthesia Checklist: Patient identified, Emergency Drugs available, Suction available, Patient being monitored and Timeout performed Patient Re-evaluated:Patient Re-evaluated prior to induction Oxygen Delivery Method: Circle system utilized Preoxygenation: Pre-oxygenation with 100% oxygen Induction Type: IV induction LMA: LMA inserted LMA Size: 4.0 Number of attempts: 1 Placement Confirmation: positive ETCO2, CO2 detector and breath sounds checked- equal and bilateral Tube secured with: Tape Dental Injury: Teeth and Oropharynx as per pre-operative assessment

## 2023-03-31 NOTE — Progress Notes (Signed)
 Pt catheter irrigated with NS, Cloth found in tubing where tubing for bag connects to catheter tubing.  Urine remains bloody with clots irrigated couple times and catheter is draining small amt of bloody urine in tube.   Dr. Ronne Binning called no answer left voice message .

## 2023-03-31 NOTE — Transfer of Care (Signed)
 Immediate Anesthesia Transfer of Care Note  Patient: Chase Guerrero  Procedure(s) Performed: TRANSURETHRAL RESECTION OF BLADDER TUMOR (TURBT) (Bladder) BLADDER INSTILLATION- gemcitabine (Bladder)  Patient Location: PACU  Anesthesia Type:General  Level of Consciousness: awake, alert , oriented, and patient cooperative  Airway & Oxygen Therapy: Patient Spontanous Breathing and Patient connected to face mask oxygen  Post-op Assessment: Report given to RN, Post -op Vital signs reviewed and stable, and Patient moving all extremities X 4  Post vital signs: Reviewed and stable  Last Vitals:  Vitals Value Taken Time  BP 142/71 03/31/23 0830  Temp 36.5 C 03/31/23 0830  Pulse 78 03/31/23 0833  Resp 14 03/31/23 0833  SpO2 98 % 03/31/23 0833  Vitals shown include unfiled device data.  Last Pain:  Vitals:   03/31/23 0635  PainSc: 0-No pain         Complications: No notable events documented.

## 2023-03-31 NOTE — Discharge Instructions (Addendum)
  Post Anesthesia Home Care Instructions  Activity: Get plenty of rest for the remainder of the day. A responsible individual must stay with you for 24 hours following the procedure.  For the next 24 hours, DO NOT: -Drive a car -Advertising copywriter -Drink alcoholic beverages -Take any medication unless instructed by your physician -Make any legal decisions or sign important papers.  Meals: Start with liquid foods such as gelatin or soup. Progress to regular foods as tolerated. Avoid greasy, spicy, heavy foods. If nausea and/or vomiting occur, drink only clear liquids until the nausea and/or vomiting subsides. Call your physician if vomiting continues.  Special Instructions/Symptoms: Your throat may feel dry or sore from the anesthesia or the breathing tube placed in your throat during surgery. If this causes discomfort, gargle with warm salt water. The discomfort should disappear within 24 hours.     Rent/Utilities/Housing   Agency: [employer] Address: [address] [address] [place], [place] [address]  [address] [place], [place] [address]  Phone Number: [phone] or [phone] For the hearing-impaired - Dial 711 for [employer] Agency Name: Miguel Aschoff. Dept. of Health and Human Services Address: [address], [place], [place] [address] Phone: [phone] Website: www.co.[email or web address] Services Offered: Temporary financial assistance, subsidized housing, and utility  assistance  Agency Name: [employer]  Address: [address] [place], [place] [address] Phone: [phone] ext. [phone] Email: Contact: info@[employer] .org Website: FootballPromos.co.nz Services Offered: Subsidized apartment rent based on income.  Agency Name: [facility] Address: [facility], [address] [place], [place] Phone: [phone]  Website: www.[facility].org Services Offered: Museum/gallery curator, utility assistance Technical brewer for  all of  [facility], [employer], [employer], [employer]  January 15, 2020 [address] for [place] area only), rent assistance.  Agency Name: [facility] Address: [address] [place], [place], [place] [address] / [address],  [place] Phone: [phone] [place] / [phone] [place] Website: OpinionTrades.tn NetworkAffair.co.za Services Offered: Civil Service fast streamer, food, showers, hygiene products utility payment  assistance, thrift shops, rental assistance, Support Groups Agency Name: [facility]  Address: [address] [place], [place] [address]  Phone: [phone] / [phone] Website: https://www.[facility].org/ Services Offered: Support groups for Bipolar, substance abuse, anger  management, panic depression and anxiety. Mobile crisis unit,  outpatient therapy, substance abuse treatment. Agency Name: [employer]  Address: [address], [place], [place] [address]  Phone: [phone] Website: www.[facility]violence.org Services Offered: Support groups for domestic violence or sexual assault, support  group for elderly women and domestic assault.

## 2023-03-31 NOTE — Progress Notes (Signed)
 Patient having sensation to urinate

## 2023-03-31 NOTE — Progress Notes (Signed)
 Patients foley came out while NT was draining the bag , from what this writer could tell the bulb was not inflated at time it fell out of penis .

## 2023-03-31 NOTE — Progress Notes (Signed)
 Text ed MD about urine output very bloody with clots.

## 2023-03-31 NOTE — Plan of Care (Signed)
   Problem: Education: Goal: Knowledge of General Education information will improve Description Including pain rating scale, medication(s)/side effects and non-pharmacologic comfort measures Outcome: Progressing   Problem: Health Behavior/Discharge Planning: Goal: Ability to manage health-related needs will improve Outcome: Progressing

## 2023-03-31 NOTE — Progress Notes (Signed)
 New 22 Foley replaced by Denese Killings , RN and this writer to assist. Patient CBI continued and blood tinged urine noted to return to bag

## 2023-04-01 ENCOUNTER — Encounter (HOSPITAL_COMMUNITY): Payer: Self-pay | Admitting: Urology

## 2023-04-01 DIAGNOSIS — C679 Malignant neoplasm of bladder, unspecified: Secondary | ICD-10-CM | POA: Diagnosis not present

## 2023-04-01 LAB — BASIC METABOLIC PANEL
Anion gap: 8 (ref 5–15)
BUN: 22 mg/dL (ref 8–23)
CO2: 23 mmol/L (ref 22–32)
Calcium: 8.4 mg/dL — ABNORMAL LOW (ref 8.9–10.3)
Chloride: 103 mmol/L (ref 98–111)
Creatinine, Ser: 1.48 mg/dL — ABNORMAL HIGH (ref 0.61–1.24)
GFR, Estimated: 48 mL/min — ABNORMAL LOW (ref >60)
Glucose, Bld: 140 mg/dL — ABNORMAL HIGH (ref 70–99)
Potassium: 4.7 mmol/L (ref 3.5–5.1)
Sodium: 134 mmol/L — ABNORMAL LOW (ref 135–145)

## 2023-04-01 LAB — CBC
HCT: 34.1 % — ABNORMAL LOW (ref 39.0–52.0)
Hemoglobin: 11 g/dL — ABNORMAL LOW (ref 13.0–17.0)
MCH: 28.7 pg (ref 26.0–34.0)
MCHC: 32.3 g/dL (ref 30.0–36.0)
MCV: 89 fL (ref 80.0–100.0)
Platelets: 159 10*3/uL (ref 150–400)
RBC: 3.83 MIL/uL — ABNORMAL LOW (ref 4.22–5.81)
RDW: 13.5 % (ref 11.5–15.5)
WBC: 16.9 10*3/uL — ABNORMAL HIGH (ref 4.0–10.5)
nRBC: 0 % (ref 0.0–0.2)

## 2023-04-01 LAB — SURGICAL PATHOLOGY

## 2023-04-01 MED ORDER — CHLORHEXIDINE GLUCONATE CLOTH 2 % EX PADS
6.0000 | MEDICATED_PAD | Freq: Every day | CUTANEOUS | Status: DC
  Administered 2023-04-01 – 2023-04-02 (×2): 6 via TOPICAL

## 2023-04-01 MED ORDER — CALCIUM CARBONATE ANTACID 500 MG PO CHEW
1.0000 | CHEWABLE_TABLET | Freq: Once | ORAL | Status: AC | PRN
  Administered 2023-04-01: 200 mg via ORAL
  Filled 2023-04-01: qty 1

## 2023-04-01 MED ORDER — CALCIUM CARBONATE ANTACID 500 MG PO CHEW
1.0000 | CHEWABLE_TABLET | Freq: Three times a day (TID) | ORAL | Status: DC | PRN
  Administered 2023-04-01: 200 mg via ORAL
  Filled 2023-04-01: qty 1

## 2023-04-01 NOTE — Anesthesia Postprocedure Evaluation (Signed)
 Anesthesia Post Note  Patient: Chase Guerrero  Procedure(s) Performed: TRANSURETHRAL RESECTION OF BLADDER TUMOR (TURBT) (Bladder) BLADDER INSTILLATION- gemcitabine (Bladder)  Patient location during evaluation: Phase II Anesthesia Type: General Level of consciousness: awake Pain management: pain level controlled Vital Signs Assessment: post-procedure vital signs reviewed and stable Respiratory status: spontaneous breathing and respiratory function stable Cardiovascular status: blood pressure returned to baseline and stable Postop Assessment: no headache and no apparent nausea or vomiting Anesthetic complications: no Comments: Late entry   No notable events documented.   Last Vitals:  Vitals:   04/01/23 1354 04/01/23 1950  BP: 104/60 (!) 113/58  Pulse: 82 87  Resp:  14  Temp: 36.6 C 37.1 C  SpO2: 99% 98%    Last Pain:  Vitals:   04/01/23 1958  TempSrc:   PainSc: 6                  Windell Norfolk

## 2023-04-01 NOTE — Progress Notes (Signed)
 1 Day Post-Op Subjective: Patient reports mild suprapubic pain. Foley was replaced yesterday and CBI started. Multiple clots were irrigated from the bladder this morning and early afternoon. Urine very light pink on slow drip CBI  Objective: Vital signs in last 24 hours: Temp:  [97.7 F (36.5 C)-98.3 F (36.8 C)] 98.3 F (36.8 C) (02/21 0522) Pulse Rate:  [74-98] 88 (02/21 0522) Resp:  [16-20] 16 (02/21 0522) BP: (116-159)/(66-84) 124/84 (02/21 0522) SpO2:  [96 %-99 %] 96 % (02/21 0522)  Intake/Output from previous day: 02/20 0701 - 02/21 0700 In: 29132.2 [P.O.:850; I.V.:882.2; IV Piggyback:100] Out: 95621 [Urine:38300; Blood:10] Intake/Output this shift: Total I/O In: 720 [P.O.:720] Out: 4950 [Urine:4950]  Physical Exam:  General:alert, cooperative, and appears stated age GI: soft, non tender, normal bowel sounds, no palpable masses, no organomegaly, no inguinal hernia Male genitalia: not done Extremities: extremities normal, atraumatic, no cyanosis or edema  Lab Results: Recent Labs    03/31/23 1116 04/01/23 0421  HGB 14.5 11.0*  HCT 45.2 34.1*   BMET Recent Labs    03/31/23 1419 04/01/23 0421  NA 133* 134*  K 4.0 4.7  CL 100 103  CO2 23 23  GLUCOSE 237* 140*  BUN 15 22  CREATININE 1.43* 1.48*  CALCIUM 8.5* 8.4*   No results for input(s): "LABPT", "INR" in the last 72 hours. No results for input(s): "LABURIN" in the last 72 hours. Results for orders placed or performed in visit on 02/22/23  Microscopic Examination     Status: Abnormal   Collection Time: 02/22/23 10:40 AM   Urine  Result Value Ref Range Status   WBC, UA 0-5 0 - 5 /hpf Final   RBC, Urine >30 (A) 0 - 2 /hpf Final   Epithelial Cells (non renal) 0-10 0 - 10 /hpf Final   Casts Present (A) None seen /lpf Final   Cast Type Granular casts (A) N/A Final   Bacteria, UA None seen None seen/Few Final    Studies/Results: No results found.  Assessment/Plan: POD#1 Bladder tumor resection We  will continue to wean CBI and if his urine clears he will be discharged tonight versus tomorrow morning   LOS: 0 days   Wilkie Aye 04/01/2023, 1:45 PM

## 2023-04-01 NOTE — Care Management Obs Status (Signed)
 MEDICARE OBSERVATION STATUS NOTIFICATION   Patient Details  Name: Chase Guerrero MRN: [id number] Date of Birth: [birth year]   Medicare Observation Status Notification Given:  Yes    Corey Harold 04/01/2023, 3:10 PM

## 2023-04-01 NOTE — Plan of Care (Signed)
  Problem: Education: Goal: Knowledge of General Education information will improve Description: Including pain rating scale, medication(s)/side effects and non-pharmacologic comfort measures Outcome: Progressing   Problem: Coping: Goal: Level of anxiety will decrease Outcome: Progressing   Problem: Pain Managment: Goal: General experience of comfort will improve and/or be controlled Outcome: Progressing

## 2023-04-01 NOTE — Progress Notes (Signed)
 During start of shift at approximately 1930 CBI was not flowing correctly, foley manually flushed with normal saline and several clots noted. CBI continuing at fast rate, output is still red with no clotting noted at this time.

## 2023-04-02 DIAGNOSIS — C679 Malignant neoplasm of bladder, unspecified: Secondary | ICD-10-CM | POA: Diagnosis not present

## 2023-04-02 NOTE — Progress Notes (Signed)
 Patient discharged home with family with catheter intact. Verbalizes understanding of discharge instructions.

## 2023-04-02 NOTE — Discharge Summary (Signed)
 Physician Discharge Summary  Patient ID: ARMISTEAD SULT MRN: [id number] DOB/AGE: 1945-04-13 78 y.o.  Admit date: 03/31/2023 Discharge date: 04/02/2023  Admission Diagnoses:  Discharge Diagnoses:  Principal Problem:   Bladder tumor   Discharged Condition: good  Hospital Course: Mr. Dall was admitted following TURBT and instillation of bladder chemotherapy agent.  He has some moderate BPH and a median lobe and oozing from the prostate so he was on traction and CBI.  Postop day 1 the CBI was discontinued.  His urine remained with light hematuria and he was discharged to home.  Evidently his urine darkened and CBI was restarted and he was kept.  Postop day 2 he has done well.  CBI has been off this afternoon and urine remains grade 1.  He is without complaint.  He has ambulated around his room.  Consults: None  Significant Diagnostic Studies: none  Treatments: surgery: TURBT and postoperative instillation of bladder chemotherapy agent  Discharge Exam: Blood pressure 116/62, pulse 93, temperature 98.7 F (37.1 C), temperature source Oral, resp. rate 16, height 5\' 10"  (1.778 m), weight 85.8 kg, SpO2 96%. No acute distress, alert and oriented, talking with his daughter CV-RRR Respiratory-regular effort and depth Abdomen-soft and nontender GU-Foley catheter in place with grade 1 urine in the tubing off CBI Extremity-no calf pain or swelling  Disposition: Discharge disposition: 01-Home or Self Care       Discharge Instructions     Continue foley catheter   Complete by: As directed    Discharge patient   Complete by: As directed    Discharge disposition: 01-Home or Self Care   Discharge patient date: 04/01/2023   Discharge patient   Complete by: As directed    Discharge disposition: 01-Home or Self Care   Discharge patient date: 04/02/2023      Allergies as of 04/02/2023   Not on File      Medication List     TAKE these medications    acetaminophen 500 MG  tablet Commonly known as: TYLENOL Take 1,000 mg by mouth every 6 (six) hours as needed for moderate pain.   alfuzosin 10 MG 24 hr tablet Commonly known as: UROXATRAL Take 1 tablet (10 mg total) by mouth at bedtime.   aspirin EC 81 MG tablet Take 81 mg by mouth every other day.   Cannabidiol Powd Apply 1 application topically 3 (three) times daily as needed (pain.). CBD cream   ezetimibe 10 MG tablet Commonly known as: ZETIA Take 10 mg by mouth daily.   finasteride 5 MG tablet Commonly known as: PROSCAR Take 5 mg by mouth in the morning.   HYDROcodone-acetaminophen 5-325 MG tablet Commonly known as: NORCO/VICODIN Take 1 tablet by mouth every 6 (six) hours as needed for up to 5 days for moderate pain (pain score 4-6) or severe pain (pain score 7-10).   lisinopril 2.5 MG tablet Commonly known as: ZESTRIL TAKE ONE TABLET (2.5MG  TOTAL) BY MOUTH DAILY   nitrofurantoin (macrocrystal-monohydrate) 100 MG capsule Commonly known as: MACROBID Take 1 capsule (100 mg total) by mouth every 12 (twelve) hours.   nitroGLYCERIN 0.4 MG SL tablet Commonly known as: NITROSTAT Place 1 tablet (0.4 mg total) under the tongue every 5 (five) minutes x 3 doses as needed for chest pain.   rosuvastatin 40 MG tablet Commonly known as: CRESTOR TAKE ONE TABLET (40MG  TOTAL) BY MOUTH DAILY   Sensitive Eyes Saline Soln Place 1 drop into both eyes 3 (three) times daily.  Follow-up Information     McKenzie, Mardene Celeste, MD. Call in 1 week(s).   Specialty: Urology Contact information: [address]  [place] [address] [phone]                 Signed: Jerilee Field 04/02/2023, 5:08 PM

## 2023-04-05 ENCOUNTER — Other Ambulatory Visit: Payer: Self-pay

## 2023-04-05 ENCOUNTER — Telehealth: Payer: Self-pay

## 2023-04-05 NOTE — Telephone Encounter (Signed)
 Patient's wife called about medication concerns. Eber Jones state's patient was on an Antibiotic. Patient's wife was made aware that the antibiotic was prescribe back in 2024 and patient should have completed medication. Patient's wife state's patient is taking Flomax 0.4 mg and it's not listed in his current medications . After looking back at last office note, patient was advised to continued Alfuzosin and Proscar 5 mg per Sarah's last note.

## 2023-04-06 DIAGNOSIS — L239 Allergic contact dermatitis, unspecified cause: Secondary | ICD-10-CM | POA: Diagnosis not present

## 2023-04-06 NOTE — Progress Notes (Unsigned)
 Name: Chase Guerrero DOB: 1945/05/24 MRN: [id number]  Diagnoses: Post-operative state  HPI: Chase Guerrero presents post-operatively.  He is accompanied by his wife. GU History: 1. BPH with LUTS (frequency, urgency, weak stream). - Taking Uroxatral 10 mg nightly and Proscar 5 mg daily. 2. Elevated PSA with negative biopsy x2 in the past.   Today He presents s/p the following procedures by Dr. Ronne Binning on 03/31/2023:  Preoperative diagnosis: bladder tumor   Postoperative diagnosis: Same   Procedure:  1. Cystoscopy 2. Transurethral resection of bladder tumor, medium 3. Instillation of bladder chemotherapy agent  Pathology:  A. BLADDER TUMOR, TURBT:  Noninvasive high grade papillary urothelial carcinoma  Muscularis propria (detrusor muscle) is present and not involved  Findings: 3.5cm papillary dome tumor and 14mm papillary posterior wall tumor.  Postop course: He reports the catheter is draining well.  He denies acute flank pain, abdominal pain, fevers, nausea, or vomiting.   Fall Screening: Do you usually have a device to assist in your mobility? No   Medications: Current Outpatient Medications  Medication Sig Dispense Refill   acetaminophen (TYLENOL) 500 MG tablet Take 1,000 mg by mouth every 6 (six) hours as needed for moderate pain.     alfuzosin (UROXATRAL) 10 MG 24 hr tablet Take 1 tablet (10 mg total) by mouth at bedtime. 30 tablet 11   aspirin EC 81 MG tablet Take 81 mg by mouth every other day.     Cannabidiol POWD Apply 1 application topically 3 (three) times daily as needed (pain.). CBD cream     ezetimibe (ZETIA) 10 MG tablet Take 10 mg by mouth daily.     finasteride (PROSCAR) 5 MG tablet Take 5 mg by mouth in the morning.     lisinopril (ZESTRIL) 2.5 MG tablet TAKE ONE TABLET (2.5MG  TOTAL) BY MOUTH DAILY 30 tablet 6   nitroGLYCERIN (NITROSTAT) 0.4 MG SL tablet Place 1 tablet (0.4 mg total) under the tongue every 5 (five) minutes x 3 doses as needed for  chest pain. 25 tablet 3   rosuvastatin (CRESTOR) 40 MG tablet TAKE ONE TABLET (40MG  TOTAL) BY MOUTH DAILY 30 tablet 3   Soft Lens Products (SENSITIVE EYES SALINE) SOLN Place 1 drop into both eyes 3 (three) times daily.     No current facility-administered medications for this visit.    Allergies: Not on File  Past Medical History:  Diagnosis Date   Arthritis    Atrial fibrillation (HCC)    Cancer (HCC)    skin   Coronary artery disease    Enlarged prostate    GERD (gastroesophageal reflux disease)    Hypertension    Myocardial infarction (HCC)    mild   Pleural effusion    Sleep apnea    not wearing  cpap  not worn in 9-10 yrs   Past Surgical History:  Procedure Laterality Date   BIOPSY  01/04/2022   Procedure: BIOPSY;  Surgeon: Corbin Ade, MD;  [facility]: [facility];  Service: Endoscopy;;   BLADDER INSTILLATION N/A 03/31/2023   Procedure: BLADDER INSTILLATION- gemcitabine;  Surgeon: Malen Gauze, MD;  Location: [facility];  Service: Urology;  Laterality: N/A;   CHEST TUBE INSERTION Left 08/16/2017   Procedure: INSERTION PLEURAL DRAINAGE CATHETER;  Surgeon: Kerin Perna, MD;  Location: [facility] OR;  Service: Thoracic;  Laterality: Left;   COLONOSCOPY N/A 07/23/2015   Procedure: COLONOSCOPY;  Surgeon: Corbin Ade, MD;  Location: [facility];  Service: Endoscopy;  Laterality: N/A;  10:30 Am   COLONOSCOPY N/A 08/27/2020   Procedure: COLONOSCOPY;  Surgeon: Corbin Ade, MD;  [facility]: [facility];  Service: Endoscopy;  Laterality: N/A;  10:30am   CORONARY ARTERY BYPASS GRAFT N/A 06/27/2017   Procedure: CORONARY ARTERY BYPASS GRAFTING (CABG) x 2  WITH ENDOSCOPIC HARVESTING OF RIGHT SAPHENOUS VEIN;  Surgeon: Kerin Perna, MD;  Location: [facility] OR;  Service: Open Heart Surgery;  Laterality: N/A;   ESOPHAGOGASTRODUODENOSCOPY (EGD) WITH PROPOFOL N/A 01/04/2022   Procedure: ESOPHAGOGASTRODUODENOSCOPY (EGD) WITH PROPOFOL;  Surgeon: Corbin Ade, MD;  [facility];  Service: Endoscopy;  Laterality: N/A;  12:15 PM, pt knows to arrive at 7:15   IR THORACENTESIS ASP PLEURAL SPACE W/IMG GUIDE  08/08/2017   LEFT HEART CATH AND CORONARY ANGIOGRAPHY N/A 06/14/2017   Procedure: LEFT HEART CATH AND CORONARY ANGIOGRAPHY;  Surgeon: Marykay Lex, MD;  Location: [facility] INVASIVE CV LAB;  Service: Cardiovascular;  Laterality: N/A;   MALONEY DILATION N/A 01/04/2022   Procedure: Elease Hashimoto DILATION;  Surgeon: Corbin Ade, MD;  [facility]: [facility];  Service: Endoscopy;  Laterality: N/A;   POLYPECTOMY  08/27/2020   Procedure: POLYPECTOMY;  Surgeon: Corbin Ade, MD;  [facility];  Service: Endoscopy;;  cecal x2   REMOVAL OF PLEURAL DRAINAGE CATHETER Left 10/13/2017   Procedure: REMOVAL OF PLEURAL DRAINAGE CATHETER;  Surgeon: Kerin Perna, MD;  Location: [facility] OR;  Service: Thoracic;  Laterality: Left;   Right hand surgery     Right knee arthroscopy     TEE WITHOUT CARDIOVERSION N/A 06/27/2017   Procedure: TRANSESOPHAGEAL ECHOCARDIOGRAM (TEE);  Surgeon: Donata Clay, Theron Arista, MD;  Location: [facility] OR;  Service: Open Heart Surgery;  Laterality: N/A;   TRANSURETHRAL RESECTION OF BLADDER TUMOR N/A 03/31/2023   Procedure: TRANSURETHRAL RESECTION OF BLADDER TUMOR (TURBT);  Surgeon: Malen Gauze, MD;  Location: [facility];  Service: Urology;  Laterality: N/A;  pt will arrive between 6 - 6:15   Family History  Problem Relation Age of Onset   Alzheimer's disease Mother    Arthritis Sister    Heart attack Brother    Hypercholesterolemia Sister    Liver disease Sister    Social History   Socioeconomic History   Marital status: Married    Spouse name: Not on file   Number of children: Not on file   Years of education: Not on file   Highest education level: Not on file  Occupational History   Occupation: Retired  Tobacco Use   Smoking status: Former    Current packs/day: 0.00    Average packs/day: 0.5 packs/day for 30.0 years (15.0 ttl pk-yrs)    Types:  Cigarettes    Start date: 02/15/1958    Quit date: 02/16/1988    Years since quitting: 35.1   Smokeless tobacco: Former    Types: Snuff   Tobacco comments:    USING NICORETTE GUM TO TRY AND STOP USING SMOKELESS TOBACCO  Vaping Use   Vaping status: Never Used  Substance and Sexual Activity   Alcohol use: No   Drug use: No   Sexual activity: Not Currently  Other Topics Concern   Not on file  Social History Narrative   Not on file   Social Drivers of Health   Financial Resource Strain: Not on file  Food Insecurity: No Food Insecurity (03/31/2023)   Hunger Vital Sign    Worried About Running Out of Food in the Last Year: Never true    Ran  Out of Food in the Last Year: Never true  Transportation Needs: No Transportation Needs (03/31/2023)   PRAPARE - Transportation    Lack of Transportation (Medical): No    Lack of Transportation (Non-Medical): No  Physical Activity: Not on file  Stress: Not on file  Social Connections: Unknown (03/31/2023)   Social Connection and Isolation Panel [NHANES]    Frequency of Communication with Friends and Family: Twice a week    Frequency of Social Gatherings with Friends and Family: Twice a week    Attends Religious Services: Patient unable to answer    Active Member of Clubs or Organizations: No    Attends Engineer, structural: More than 4 times per year    Marital Status: Married  Catering manager Violence: Not At Risk (03/31/2023)   Humiliation, Afraid, Rape, and Kick questionnaire    Fear of Current or Ex-Partner: No    Emotionally Abused: No    Physically Abused: No    Sexually Abused: No    SUBJECTIVE  Review of Systems Constitutional: Patient denies any unintentional weight loss or change in strength lntegumentary: Patient denies any rashes or pruritus Cardiovascular: Patient denies chest pain or syncope Respiratory: Patient denies shortness of breath Gastrointestinal: Patient denies nausea, vomiting, constipation, or  diarrhea Musculoskeletal: Patient denies muscle cramps or weakness Neurologic: Patient denies convulsions or seizures Allergic/Immunologic: Patient denies recent allergic reaction(s) Hematologic/Lymphatic: Patient denies bleeding tendencies Endocrine: Patient denies heat/cold intolerance  GU: As per HPI.  OBJECTIVE Vitals:   04/07/23 0929  BP: 111/66  Pulse: 93  Temp: 97.6 F (36.4 C)   There is no height or weight on file to calculate BMI.  Physical Examination Constitutional: No obvious distress; patient is non-toxic appearing  Cardiovascular: No visible lower extremity edema.  Respiratory: The patient does not have audible wheezing/stridor; respirations do not appear labored  Gastrointestinal: Abdomen non-distended Musculoskeletal: Normal ROM of UEs  Skin: No obvious rashes/open sores  Neurologic: CN 2-12 grossly intact Psychiatric: Answered questions appropriately with normal affect  Hematologic/Lymphatic/Immunologic: No obvious bruises or sites of spontaneous bleeding   ASSESSMENT Malignant neoplasm of urinary bladder, unspecified site (HCC) - Plan: Bladder Voiding Trial, ciprofloxacin (CIPRO) tablet 500 mg, Ambulatory Referral For Surgery Scheduling  Postop check - Plan: Bladder Voiding Trial, ciprofloxacin (CIPRO) tablet 500 mg  We reviewed the operative procedures and findings. Patient was advised that based on pathology results (high grade tumor) Dr. Ronne Binning said patient needs repeat TURBT in 4-6 weeks to ensure he got it all. Surgery request submitted.   Passed voiding trial. Foley catheter to remain out.   Patient verbalized understanding of and agreement with current plan. All questions were answered.  PLAN Advised the following: Foley catheter discontinued. Return for surgery.  Orders Placed This Encounter  Procedures   Ambulatory Referral For Surgery Scheduling    Referral Priority:   Routine    Referral Type:   Consultation    Referred to Provider:    Malen Gauze, MD    Number of Visits Requested:   1   Bladder Voiding Trial   Total time spent caring for the patient today was over 30 minutes. This includes time spent on the date of the visit reviewing the patient's chart before the visit, time spent during the visit, and time spent after the visit on documentation. Over 50% of that time was spent in face-to-face time with this patient for direct counseling. E&M based on time and complexity of medical decision making.  It has been  explained that the patient is to follow regularly with their PCP in addition to all other providers involved in their care and to follow instructions provided by these respective offices. Patient advised to contact urology clinic if any urologic-pertaining questions, concerns, new symptoms or problems arise in the interim period.  There are no Patient Instructions on file for this visit.  Electronically signed by:  Donnita Falls, MSN, FNP-C, CUNP 04/07/2023 10:41 AM

## 2023-04-07 ENCOUNTER — Encounter: Payer: Self-pay | Admitting: Urology

## 2023-04-07 ENCOUNTER — Ambulatory Visit (INDEPENDENT_AMBULATORY_CARE_PROVIDER_SITE_OTHER): Payer: HMO | Admitting: Urology

## 2023-04-07 VITALS — BP 111/66 | HR 93 | Temp 97.6°F

## 2023-04-07 DIAGNOSIS — Z09 Encounter for follow-up examination after completed treatment for conditions other than malignant neoplasm: Secondary | ICD-10-CM

## 2023-04-07 DIAGNOSIS — C679 Malignant neoplasm of bladder, unspecified: Secondary | ICD-10-CM | POA: Diagnosis not present

## 2023-04-07 MED ORDER — CIPROFLOXACIN HCL 500 MG PO TABS
500.0000 mg | ORAL_TABLET | Freq: Once | ORAL | Status: AC
  Administered 2023-04-07: 500 mg via ORAL

## 2023-04-07 NOTE — Progress Notes (Signed)
 Fill and Pull Catheter Removal  Patient is present today for a catheter removal.  200 ml of sterile water was instilled into the bladder when the patient felt the urge to urinate. 12 ml of water was then drained from the balloon.  A 22 FR 3 way foley cath was removed from the bladder no complications were noted .  Foley catheter intact and time of removal. Patient as then given some time to void on their own.  Patient can void  on their own after some time.  Patient tolerated well.  One oral prophylactic antibiotic given per MD orders  Performed by: Kennyth Lose, CMA  Follow up/ Additional notes: Patient will return for PVR

## 2023-04-11 ENCOUNTER — Other Ambulatory Visit: Payer: Self-pay | Admitting: Cardiology

## 2023-04-13 ENCOUNTER — Telehealth: Payer: Self-pay | Admitting: Urology

## 2023-04-13 NOTE — Telephone Encounter (Signed)
 Called Pt and let him know that he can go to the chiropractor per verbal from [facility]

## 2023-04-13 NOTE — Telephone Encounter (Signed)
 Patient wants to know if he goes to chiropractor to get his back adjusted , his back and hips are hurting

## 2023-04-15 ENCOUNTER — Ambulatory Visit: Admitting: Urology

## 2023-04-23 ENCOUNTER — Other Ambulatory Visit: Payer: Self-pay

## 2023-04-23 ENCOUNTER — Encounter (HOSPITAL_COMMUNITY): Payer: Self-pay | Admitting: Emergency Medicine

## 2023-04-23 ENCOUNTER — Emergency Department (HOSPITAL_COMMUNITY)
Admission: EM | Admit: 2023-04-23 | Discharge: 2023-04-23 | Disposition: A | Discharge status: Home / Self Care | Attending: Emergency Medicine | Admitting: Emergency Medicine

## 2023-04-23 DIAGNOSIS — Z7982 Long term (current) use of aspirin: Secondary | ICD-10-CM | POA: Insufficient documentation

## 2023-04-23 DIAGNOSIS — N390 Urinary tract infection, site not specified: Secondary | ICD-10-CM | POA: Diagnosis not present

## 2023-04-23 DIAGNOSIS — R300 Dysuria: Secondary | ICD-10-CM | POA: Diagnosis not present

## 2023-04-23 DIAGNOSIS — D649 Anemia, unspecified: Secondary | ICD-10-CM | POA: Diagnosis not present

## 2023-04-23 DIAGNOSIS — R319 Hematuria, unspecified: Secondary | ICD-10-CM | POA: Diagnosis not present

## 2023-04-23 LAB — URINALYSIS, ROUTINE W REFLEX MICROSCOPIC
Bacteria, UA: NONE SEEN
Bilirubin Urine: NEGATIVE
Glucose, UA: NEGATIVE mg/dL
Ketones, ur: NEGATIVE mg/dL
Nitrite: NEGATIVE
Protein, ur: 30 mg/dL — AB
RBC / HPF: >50 RBC/hpf (ref 0–5)
Specific Gravity, Urine: 1.011 (ref 1.005–1.030)
pH: 5 (ref 5.0–8.0)

## 2023-04-23 LAB — COMPREHENSIVE METABOLIC PANEL
ALT: 26 U/L (ref 0–44)
AST: 20 U/L (ref 15–41)
Albumin: 3.1 g/dL — ABNORMAL LOW (ref 3.5–5.0)
Alkaline Phosphatase: 47 U/L (ref 38–126)
Anion gap: 9 (ref 5–15)
BUN: 17 mg/dL (ref 8–23)
CO2: 24 mmol/L (ref 22–32)
Calcium: 8.8 mg/dL — ABNORMAL LOW (ref 8.9–10.3)
Chloride: 102 mmol/L (ref 98–111)
Creatinine, Ser: 1.41 mg/dL — ABNORMAL HIGH (ref 0.61–1.24)
GFR, Estimated: 51 mL/min — ABNORMAL LOW (ref >60)
Glucose, Bld: 143 mg/dL — ABNORMAL HIGH (ref 70–99)
Potassium: 4.4 mmol/L (ref 3.5–5.1)
Sodium: 135 mmol/L (ref 135–145)
Total Bilirubin: 0.4 mg/dL (ref 0.0–1.2)
Total Protein: 6.1 g/dL — ABNORMAL LOW (ref 6.5–8.1)

## 2023-04-23 LAB — CBC WITH DIFFERENTIAL/PLATELET
Abs Immature Granulocytes: 0.02 10*3/uL (ref 0.00–0.07)
Basophils Absolute: 0 10*3/uL (ref 0.0–0.1)
Basophils Relative: 0 %
Eosinophils Absolute: 0.1 10*3/uL (ref 0.0–0.5)
Eosinophils Relative: 2 %
HCT: 31.6 % — ABNORMAL LOW (ref 39.0–52.0)
Hemoglobin: 10 g/dL — ABNORMAL LOW (ref 13.0–17.0)
Immature Granulocytes: 0 %
Lymphocytes Relative: 37 %
Lymphs Abs: 2.8 10*3/uL (ref 0.7–4.0)
MCH: 28.4 pg (ref 26.0–34.0)
MCHC: 31.6 g/dL (ref 30.0–36.0)
MCV: 89.8 fL (ref 80.0–100.0)
Monocytes Absolute: 0.5 10*3/uL (ref 0.1–1.0)
Monocytes Relative: 7 %
Neutro Abs: 4 10*3/uL (ref 1.7–7.7)
Neutrophils Relative %: 54 %
Platelets: 207 10*3/uL (ref 150–400)
RBC: 3.52 MIL/uL — ABNORMAL LOW (ref 4.22–5.81)
RDW: 14.4 % (ref 11.5–15.5)
WBC: 7.5 10*3/uL (ref 4.0–10.5)
nRBC: 0 % (ref 0.0–0.2)

## 2023-04-23 MED ORDER — CEPHALEXIN 500 MG PO CAPS
500.0000 mg | ORAL_CAPSULE | Freq: Once | ORAL | Status: AC
Start: 2023-04-23 — End: 2023-04-23
  Administered 2023-04-23: 500 mg via ORAL
  Filled 2023-04-23: qty 1

## 2023-04-23 MED ORDER — CEPHALEXIN 500 MG PO CAPS: 500.0000 mg | ORAL_CAPSULE | Freq: Two times a day (BID) | ORAL | 0 refills | Status: AC

## 2023-04-23 NOTE — ED Triage Notes (Signed)
 Pt reports blood in his urine last night. Pt reports bright red blood with multiple clots. Pt reports a history of frequent urination and a recent operation on his bladder to remove 2 malignant tumors. Pt has not had his post op evaluation yet but was informed the surgeon believes all of the malignancy was removed. Pt denies any other cancer treatments at this time. PT reports this is the first occurrence of hematuria since his operation approx 4 weeks prior. Pt denies any pain, but endorse occasional burning. Pt denies any incontinence. Pt reports he has a rash on his back during his last admission and was given prednisone after discharge. He is unsure what medication caused the rash

## 2023-04-23 NOTE — Discharge Instructions (Signed)
 Please follow-up closely with urology on an outpatient basis.  Take all antibiotics as directed.  Return to emergency department immediately for any new or worsening symptoms.

## 2023-04-23 NOTE — ED Provider Notes (Signed)
 [place] EMERGENCY DEPARTMENT AT [facility] Provider Note   CSN: [id number] Arrival date & time: 04/23/23  9147     History  Chief Complaint  Patient presents with   Hematuria    Chase Guerrero is a 78 y.o. male.  Patient is a 78 year old male who presents emergency department the chief complaint of hematuria last night.  Patient notes that the hematuria has resolved today.  He does admit to some mild intermittent dysuria.  He denies any associated abdominal pain, back pain, fever, chills.  He did have a procedure approximately 1 month ago with urology to have a tumor removed from his bladder.  Patient notes that he has been doing well since that time.  He notes he did reach out to urology this morning and was directed to come to the emergency department for further evaluation.  Patient notes that he otherwise feels at his baseline at this time.   Hematuria       Home Medications Prior to Admission medications   Medication Sig Start Date End Date Taking? Authorizing Provider  acetaminophen (TYLENOL) 500 MG tablet Take 1,000 mg by mouth every 6 (six) hours as needed for moderate pain.    [provider]  alfuzosin (UROXATRAL) 10 MG 24 hr tablet Take 1 tablet (10 mg total) by mouth at bedtime. 11/22/22   McKenzie, Mardene Celeste, MD  aspirin EC 81 MG tablet Take 81 mg by mouth every other day.    [provider]  Cannabidiol POWD Apply 1 application topically 3 (three) times daily as needed (pain.). CBD cream    [provider]  ezetimibe (ZETIA) 10 MG tablet Take 10 mg by mouth daily. 01/07/21   [provider]  finasteride (PROSCAR) 5 MG tablet Take 5 mg by mouth in the morning.    [provider]  lisinopril (ZESTRIL) 2.5 MG tablet TAKE ONE TABLET (2.5MG  TOTAL) BY MOUTH DAILY 12/02/22   Branch, Dorothe Pea, MD  nitroGLYCERIN (NITROSTAT) 0.4 MG SL tablet Place 1 tablet (0.4 mg total) under the tongue every 5 (five) minutes x 3  doses as needed for chest pain. 08/23/22   Antoine Poche, MD  rosuvastatin (CRESTOR) 40 MG tablet TAKE ONE TABLET (40MG  TOTAL) BY MOUTH DAILY 04/11/23   Antoine Poche, MD  Soft Lens Products (SENSITIVE EYES SALINE) SOLN Place 1 drop into both eyes 3 (three) times daily.    [provider]      Allergies    Patient has no allergy information on record.    Review of Systems   Review of Systems  Genitourinary:  Positive for hematuria.  All other systems reviewed and are negative.   Physical Exam Updated Vital Signs BP (!) 108/54 (BP Location: Right Arm)   Pulse 87   Temp 98.1 F (36.7 C) (Oral)   Resp 18   Ht 5\' 10"  (1.778 m)   Wt 81.6 kg   SpO2 100%   BMI 25.83 kg/m  Physical Exam Vitals and nursing note reviewed.  Constitutional:      Appearance: Normal appearance.  HENT:     Head: Normocephalic and atraumatic.     Nose: Nose normal.     Mouth/Throat:     Mouth: Mucous membranes are moist.  Eyes:     Extraocular Movements: Extraocular movements intact.     Conjunctiva/sclera: Conjunctivae normal.     Pupils: Pupils are equal, round, and reactive to light.  Cardiovascular:     Rate and  Rhythm: Normal rate and regular rhythm.     Pulses: Normal pulses.     Heart sounds: Normal heart sounds.  Pulmonary:     Effort: Pulmonary effort is normal. No respiratory distress.     Breath sounds: Normal breath sounds.  Abdominal:     General: Abdomen is flat. Bowel sounds are normal. There is no distension.     Palpations: Abdomen is soft.     Tenderness: There is no abdominal tenderness. There is no right CVA tenderness, left CVA tenderness or guarding.  Musculoskeletal:        General: Normal range of motion.     Cervical back: Normal range of motion and neck supple.  Skin:    General: Skin is warm and dry.     Findings: No bruising or rash.  Neurological:     General: No focal deficit present.     Mental Status: He is alert and oriented to person, place,  and time. Mental status is at baseline.  Psychiatric:        Mood and Affect: Mood normal.        Behavior: Behavior normal.        Thought Content: Thought content normal.        Judgment: Judgment normal.     ED Results / Procedures / Treatments   Labs (all labs ordered are listed, but only abnormal results are displayed) Labs Reviewed  URINALYSIS, ROUTINE W REFLEX MICROSCOPIC  COMPREHENSIVE METABOLIC PANEL  CBC WITH DIFFERENTIAL/PLATELET    EKG None  Radiology No results found.  Procedures Procedures    Medications Ordered in ED Medications - No data to display  ED Course/ Medical Decision Making/ A&P                                 Medical Decision Making Amount and/or Complexity of Data Reviewed Labs: ordered.  Risk Prescription drug management.   This patient presents to the ED for concern of hematuria differential diagnosis includes urinary tract infection, pyelonephritis, sepsis, malignancy, ureteral stone    Additional history obtained:  Additional history obtained from medical records External records from outside source obtained and reviewed including none   Lab Tests:  I Ordered, and personally interpreted labs.  The pertinent results include: Stable anemia and creatinine, hematuria leukocytes within the urine   Medicines ordered and prescription drug management:  I ordered medication including Keflex for tract infection Reevaluation of the patient after these medicines showed that the patient improved I have reviewed the patients home medicines and have made adjustments as needed   Problem List / ED Course:  Patient is doing well at this time and is stable for discharge home.  Discussed with patient that we will cover him for urinary tract infection at this point.  Patient notes that his gross hematuria has resolved this morning.  He does not warrant CBI at this time.  Patient has no acute pain and abdominal exam is benign and he has no  CVA tenderness.  Do not suspect ureteral stone or pyelonephritis at this time.  He has stable vital signs with no indication for sepsis.  Do not suspect that any imaging is warranted at this time.  He was directed to follow-up closely with his urologist next week for reevaluation.  Urine culture has been ordered.  Strict return precautions were provided for any new or worsening symptoms.  Patient voiced understanding and had no  additional questions.   Social Determinants of Health:  None           Final Clinical Impression(s) / ED Diagnoses Final diagnoses:  None    Rx / DC Orders ED Discharge Orders     None         Kathlen Mody 04/23/23 1013    Bethann Berkshire, MD 04/24/23 [id number]

## 2023-04-23 NOTE — ED Notes (Signed)
 Patient Alert and oriented to baseline. Stable and ambulatory to baseline. Patient verbalized understanding of the discharge instructions.  Patient belongings were taken by the patient.

## 2023-04-24 LAB — URINE CULTURE: Culture: NO GROWTH

## 2023-04-25 ENCOUNTER — Telehealth: Payer: Self-pay

## 2023-04-25 NOTE — Telephone Encounter (Signed)
 I called to follow up on a triage call received on 03/17.  No answer from patient, I left a voicemail requesting a call back.

## 2023-05-05 DIAGNOSIS — S338XXA Sprain of other parts of lumbar spine and pelvis, initial encounter: Secondary | ICD-10-CM | POA: Diagnosis not present

## 2023-05-05 DIAGNOSIS — S233XXA Sprain of ligaments of thoracic spine, initial encounter: Secondary | ICD-10-CM | POA: Diagnosis not present

## 2023-05-05 DIAGNOSIS — M9902 Segmental and somatic dysfunction of thoracic region: Secondary | ICD-10-CM | POA: Diagnosis not present

## 2023-05-05 DIAGNOSIS — S134XXA Sprain of ligaments of cervical spine, initial encounter: Secondary | ICD-10-CM | POA: Diagnosis not present

## 2023-05-05 DIAGNOSIS — M9903 Segmental and somatic dysfunction of lumbar region: Secondary | ICD-10-CM | POA: Diagnosis not present

## 2023-05-05 DIAGNOSIS — M9901 Segmental and somatic dysfunction of cervical region: Secondary | ICD-10-CM | POA: Diagnosis not present

## 2023-05-09 ENCOUNTER — Encounter (HOSPITAL_COMMUNITY)
Admission: RE | Admit: 2023-05-09 | Discharge: 2023-05-09 | Disposition: A | Discharge status: Home / Self Care | Source: Ambulatory Visit | Attending: Urology | Admitting: Urology

## 2023-05-09 ENCOUNTER — Encounter (HOSPITAL_COMMUNITY): Payer: Self-pay

## 2023-05-11 DIAGNOSIS — M9903 Segmental and somatic dysfunction of lumbar region: Secondary | ICD-10-CM | POA: Diagnosis not present

## 2023-05-11 DIAGNOSIS — S233XXA Sprain of ligaments of thoracic spine, initial encounter: Secondary | ICD-10-CM | POA: Diagnosis not present

## 2023-05-11 DIAGNOSIS — S338XXA Sprain of other parts of lumbar spine and pelvis, initial encounter: Secondary | ICD-10-CM | POA: Diagnosis not present

## 2023-05-11 DIAGNOSIS — M9902 Segmental and somatic dysfunction of thoracic region: Secondary | ICD-10-CM | POA: Diagnosis not present

## 2023-05-11 DIAGNOSIS — S134XXA Sprain of ligaments of cervical spine, initial encounter: Secondary | ICD-10-CM | POA: Diagnosis not present

## 2023-05-11 DIAGNOSIS — M9901 Segmental and somatic dysfunction of cervical region: Secondary | ICD-10-CM | POA: Diagnosis not present

## 2023-05-11 MED ORDER — GEMCITABINE CHEMO FOR BLADDER INSTILLATION 2000 MG
2000.0000 mg | Freq: Once | INTRAVENOUS | Status: DC
  Filled 2023-05-11: qty 52.6

## 2023-05-12 ENCOUNTER — Ambulatory Visit (HOSPITAL_BASED_OUTPATIENT_CLINIC_OR_DEPARTMENT_OTHER): Admitting: Anesthesiology

## 2023-05-12 ENCOUNTER — Ambulatory Visit (HOSPITAL_COMMUNITY)
Admission: RE | Admit: 2023-05-12 | Discharge: 2023-05-12 | Disposition: A | Discharge status: Home / Self Care | Attending: Urology | Admitting: Urology

## 2023-05-12 ENCOUNTER — Ambulatory Visit (HOSPITAL_COMMUNITY): Admitting: Anesthesiology

## 2023-05-12 ENCOUNTER — Encounter (HOSPITAL_COMMUNITY)
Admission: RE | Disposition: A | Discharge status: Home / Self Care | Payer: Self-pay | Source: Home / Self Care | Attending: Urology

## 2023-05-12 DIAGNOSIS — Z87891 Personal history of nicotine dependence: Secondary | ICD-10-CM | POA: Diagnosis not present

## 2023-05-12 DIAGNOSIS — D090 Carcinoma in situ of bladder: Secondary | ICD-10-CM | POA: Diagnosis not present

## 2023-05-12 DIAGNOSIS — N401 Enlarged prostate with lower urinary tract symptoms: Secondary | ICD-10-CM | POA: Diagnosis not present

## 2023-05-12 DIAGNOSIS — M1990 Unspecified osteoarthritis, unspecified site: Secondary | ICD-10-CM | POA: Diagnosis not present

## 2023-05-12 DIAGNOSIS — G4730 Sleep apnea, unspecified: Secondary | ICD-10-CM | POA: Insufficient documentation

## 2023-05-12 DIAGNOSIS — I10 Essential (primary) hypertension: Secondary | ICD-10-CM | POA: Diagnosis not present

## 2023-05-12 DIAGNOSIS — D494 Neoplasm of unspecified behavior of bladder: Secondary | ICD-10-CM

## 2023-05-12 DIAGNOSIS — I25119 Atherosclerotic heart disease of native coronary artery with unspecified angina pectoris: Secondary | ICD-10-CM

## 2023-05-12 DIAGNOSIS — C671 Malignant neoplasm of dome of bladder: Secondary | ICD-10-CM

## 2023-05-12 DIAGNOSIS — K219 Gastro-esophageal reflux disease without esophagitis: Secondary | ICD-10-CM | POA: Insufficient documentation

## 2023-05-12 DIAGNOSIS — R350 Frequency of micturition: Secondary | ICD-10-CM | POA: Diagnosis not present

## 2023-05-12 DIAGNOSIS — N3289 Other specified disorders of bladder: Secondary | ICD-10-CM | POA: Diagnosis not present

## 2023-05-12 DIAGNOSIS — R3915 Urgency of urination: Secondary | ICD-10-CM | POA: Insufficient documentation

## 2023-05-12 DIAGNOSIS — R3912 Poor urinary stream: Secondary | ICD-10-CM | POA: Diagnosis not present

## 2023-05-12 DIAGNOSIS — N3010 Interstitial cystitis (chronic) without hematuria: Secondary | ICD-10-CM | POA: Diagnosis not present

## 2023-05-12 DIAGNOSIS — I4891 Unspecified atrial fibrillation: Secondary | ICD-10-CM | POA: Insufficient documentation

## 2023-05-12 DIAGNOSIS — I252 Old myocardial infarction: Secondary | ICD-10-CM | POA: Diagnosis not present

## 2023-05-12 HISTORY — PX: BLADDER INSTILLATION: SHX6893

## 2023-05-12 HISTORY — PX: TRANSURETHRAL RESECTION OF BLADDER TUMOR: SHX2575

## 2023-05-12 HISTORY — PX: CYSTOSCOPY: SHX5120

## 2023-05-12 SURGERY — CYSTOSCOPY
Anesthesia: General | Site: Urethra

## 2023-05-12 MED ORDER — SODIUM CHLORIDE (PF) 0.9 % IJ SOLN
INTRAVENOUS | Status: DC | PRN
  Administered 2023-05-12: 2000 mg via INTRAVESICAL

## 2023-05-12 MED ORDER — DEXAMETHASONE SODIUM PHOSPHATE 10 MG/ML IJ SOLN
INTRAMUSCULAR | Status: AC
  Filled 2023-05-12: qty 1

## 2023-05-12 MED ORDER — CEFAZOLIN SODIUM-DEXTROSE 2-4 GM/100ML-% IV SOLN
INTRAVENOUS | Status: AC
  Filled 2023-05-12: qty 100

## 2023-05-12 MED ORDER — FENTANYL CITRATE (PF) 100 MCG/2ML IJ SOLN
INTRAMUSCULAR | Status: DC | PRN
  Administered 2023-05-12 (×2): 50 ug via INTRAVENOUS

## 2023-05-12 MED ORDER — PHENYLEPHRINE 80 MCG/ML (10ML) SYRINGE FOR IV PUSH (FOR BLOOD PRESSURE SUPPORT)
PREFILLED_SYRINGE | INTRAVENOUS | Status: DC | PRN
  Administered 2023-05-12: 160 ug via INTRAVENOUS

## 2023-05-12 MED ORDER — ROCURONIUM BROMIDE 10 MG/ML (PF) SYRINGE
PREFILLED_SYRINGE | INTRAVENOUS | Status: DC | PRN
  Administered 2023-05-12: 50 mg via INTRAVENOUS

## 2023-05-12 MED ORDER — SUGAMMADEX SODIUM 200 MG/2ML IV SOLN
INTRAVENOUS | Status: DC | PRN
  Administered 2023-05-12 (×2): 200 mg via INTRAVENOUS

## 2023-05-12 MED ORDER — ROCURONIUM BROMIDE 10 MG/ML (PF) SYRINGE
PREFILLED_SYRINGE | INTRAVENOUS | Status: AC
Start: 2023-05-12 — End: ?
  Filled 2023-05-12: qty 10

## 2023-05-12 MED ORDER — GLYCOPYRROLATE PF 0.2 MG/ML IJ SOSY
PREFILLED_SYRINGE | INTRAMUSCULAR | Status: AC
  Filled 2023-05-12: qty 1

## 2023-05-12 MED ORDER — HYDROCODONE-ACETAMINOPHEN 5-325 MG PO TABS: 1.0000 | ORAL_TABLET | Freq: Four times a day (QID) | ORAL | 0 refills | Status: AC | PRN

## 2023-05-12 MED ORDER — CEFAZOLIN SODIUM-DEXTROSE 2-4 GM/100ML-% IV SOLN
2.0000 g | INTRAVENOUS | Status: AC
  Administered 2023-05-12: 2 g via INTRAVENOUS

## 2023-05-12 MED ORDER — PROPOFOL 10 MG/ML IV BOLUS
INTRAVENOUS | Status: DC | PRN
  Administered 2023-05-12: 100 mg via INTRAVENOUS

## 2023-05-12 MED ORDER — DEXAMETHASONE SODIUM PHOSPHATE 10 MG/ML IJ SOLN
INTRAMUSCULAR | Status: DC | PRN
  Administered 2023-05-12: 10 mg via INTRAVENOUS

## 2023-05-12 MED ORDER — SUGAMMADEX SODIUM 200 MG/2ML IV SOLN
INTRAVENOUS | Status: AC
  Filled 2023-05-12: qty 2

## 2023-05-12 MED ORDER — LIDOCAINE HCL (PF) 2 % IJ SOLN
INTRAMUSCULAR | Status: DC | PRN
  Administered 2023-05-12: 80 mg via INTRADERMAL

## 2023-05-12 MED ORDER — ONDANSETRON HCL 4 MG/2ML IJ SOLN
INTRAMUSCULAR | Status: AC
  Filled 2023-05-12: qty 2

## 2023-05-12 MED ORDER — SODIUM CHLORIDE 0.9 % IR SOLN
Status: DC | PRN
  Administered 2023-05-12: 6000 mL via INTRAVESICAL

## 2023-05-12 MED ORDER — ONDANSETRON HCL 4 MG/2ML IJ SOLN
INTRAMUSCULAR | Status: DC | PRN
  Administered 2023-05-12: 4 mg via INTRAVENOUS

## 2023-05-12 MED ORDER — PROPOFOL 10 MG/ML IV BOLUS
INTRAVENOUS | Status: AC
  Filled 2023-05-12: qty 20

## 2023-05-12 MED ORDER — LACTATED RINGERS IV SOLN: INTRAVENOUS | Status: DC | PRN

## 2023-05-12 MED ORDER — FENTANYL CITRATE (PF) 100 MCG/2ML IJ SOLN
INTRAMUSCULAR | Status: AC
  Filled 2023-05-12: qty 2

## 2023-05-12 MED ORDER — KETAMINE HCL 50 MG/5ML IJ SOSY
PREFILLED_SYRINGE | INTRAMUSCULAR | Status: AC
  Filled 2023-05-12: qty 5

## 2023-05-12 MED ORDER — KETAMINE HCL 50 MG/5ML IJ SOSY
PREFILLED_SYRINGE | INTRAMUSCULAR | Status: DC | PRN
  Administered 2023-05-12: 50 mg via INTRAVENOUS

## 2023-05-12 MED ORDER — LIDOCAINE HCL (PF) 2 % IJ SOLN
INTRAMUSCULAR | Status: AC
  Filled 2023-05-12: qty 5

## 2023-05-12 MED ORDER — PHENYLEPHRINE 80 MCG/ML (10ML) SYRINGE FOR IV PUSH (FOR BLOOD PRESSURE SUPPORT)
PREFILLED_SYRINGE | INTRAVENOUS | Status: AC
  Filled 2023-05-12: qty 10

## 2023-05-12 MED ORDER — STERILE WATER FOR IRRIGATION IR SOLN
Status: DC | PRN
  Administered 2023-05-12: 1000 mL

## 2023-05-12 MED ORDER — MIDAZOLAM HCL 2 MG/2ML IJ SOLN
INTRAMUSCULAR | Status: AC
Start: 2023-05-12 — End: ?
  Filled 2023-05-12: qty 2

## 2023-05-12 SURGICAL SUPPLY — 24 items
BAG DRAIN URO TABLE W/ADPT NS (BAG) ×3 IMPLANT
BAG HAMPER (MISCELLANEOUS) ×3 IMPLANT
BAG URINE DRAIN 2000ML AR STRL (UROLOGICAL SUPPLIES) ×3 IMPLANT
CATH FOLEY 3WAY 30CC 22F (CATHETERS) IMPLANT
CLOTH BEACON ORANGE TIMEOUT ST (SAFETY) ×3 IMPLANT
ELECT LOOP 22F BIPOLAR SML (ELECTROSURGICAL) ×2 IMPLANT
ELECTRODE LOOP 22F BIPOLAR SML (ELECTROSURGICAL) ×3 IMPLANT
GLOVE BIO SURGEON STRL SZ8 (GLOVE) ×3 IMPLANT
GLOVE BIOGEL PI IND STRL 7.0 (GLOVE) ×6 IMPLANT
GOWN STRL REUS W/TWL LRG LVL3 (GOWN DISPOSABLE) ×6 IMPLANT
GOWN STRL REUS W/TWL XL LVL3 (GOWN DISPOSABLE) ×3 IMPLANT
KIT CHEMO SPILL (MISCELLANEOUS) ×3 IMPLANT
KIT TURNOVER CYSTO (KITS) ×3 IMPLANT
MANIFOLD NEPTUNE II (INSTRUMENTS) ×3 IMPLANT
PACK CYSTO (CUSTOM PROCEDURE TRAY) ×3 IMPLANT
PAD ARMBOARD POSITIONER FOAM (MISCELLANEOUS) ×3 IMPLANT
PLUG CATH AND CAP STRL 200 (CATHETERS) IMPLANT
POSITIONER HEAD 8X9X4 ADT (SOFTGOODS) ×3 IMPLANT
SOL .9 NS 3000ML IRR UROMATIC (IV SOLUTION) ×6 IMPLANT
SYR 30ML LL (SYRINGE) ×3 IMPLANT
SYR TOOMEY IRRIG 70ML (MISCELLANEOUS) ×2 IMPLANT
SYRINGE TOOMEY IRRIG 70ML (MISCELLANEOUS) ×3 IMPLANT
TOWEL OR 17X26 4PK STRL BLUE (TOWEL DISPOSABLE) ×3 IMPLANT
WATER STERILE IRR 500ML POUR (IV SOLUTION) ×3 IMPLANT

## 2023-05-12 NOTE — H&P (Signed)
 HPI: Chase Guerrero presents post-operatively.  He is accompanied by his wife. GU History: 1. BPH with LUTS (frequency, urgency, weak stream). - Taking Uroxatral 10 mg nightly and Proscar 5 mg daily. 2. Elevated PSA with negative biopsy x2 in the past.    Today He presents s/p the following procedures by Dr. Ronne Binning on 03/31/2023:   Preoperative diagnosis: bladder tumor   Postoperative diagnosis: Same   Procedure:  1. Cystoscopy 2. Transurethral resection of bladder tumor, medium 3. Instillation of bladder chemotherapy agent   Pathology:  A. BLADDER TUMOR, TURBT:  Noninvasive high grade papillary urothelial carcinoma  Muscularis propria (detrusor muscle) is present and not involved   Findings: 3.5cm papillary dome tumor and 14mm papillary posterior wall tumor.   Postop course: He reports the catheter is draining well.  He denies acute flank pain, abdominal pain, fevers, nausea, or vomiting.     Fall Screening: Do you usually have a device to assist in your mobility? No    Medications:       Current Outpatient Medications  Medication Sig Dispense Refill   acetaminophen (TYLENOL) 500 MG tablet Take 1,000 mg by mouth every 6 (six) hours as needed for moderate pain.       alfuzosin (UROXATRAL) 10 MG 24 hr tablet Take 1 tablet (10 mg total) by mouth at bedtime. 30 tablet 11   aspirin EC 81 MG tablet Take 81 mg by mouth every other day.       Cannabidiol POWD Apply 1 application topically 3 (three) times daily as needed (pain.). CBD cream       ezetimibe (ZETIA) 10 MG tablet Take 10 mg by mouth daily.       finasteride (PROSCAR) 5 MG tablet Take 5 mg by mouth in the morning.       lisinopril (ZESTRIL) 2.5 MG tablet TAKE ONE TABLET (2.5MG  TOTAL) BY MOUTH DAILY 30 tablet 6   nitroGLYCERIN (NITROSTAT) 0.4 MG SL tablet Place 1 tablet (0.4 mg total) under the tongue every 5 (five) minutes x 3 doses as needed for chest pain. 25 tablet 3   rosuvastatin (CRESTOR) 40 MG tablet TAKE ONE  TABLET (40MG  TOTAL) BY MOUTH DAILY 30 tablet 3   Soft Lens Products (SENSITIVE EYES SALINE) SOLN Place 1 drop into both eyes 3 (three) times daily.          No current facility-administered medications for this visit.        Allergies: Allergies  Not on File         Past Medical History:  Diagnosis Date   Arthritis     Atrial fibrillation (HCC)     Cancer (HCC)      skin   Coronary artery disease     Enlarged prostate     GERD (gastroesophageal reflux disease)     Hypertension     Myocardial infarction (HCC)      mild   Pleural effusion     Sleep apnea      not wearing  cpap  not worn in 9-10 yrs             Past Surgical History:  Procedure Laterality Date   BIOPSY   01/04/2022    Procedure: BIOPSY;  Surgeon: Corbin Ade, MD;  Location: [facility];  Service: Endoscopy;;   BLADDER INSTILLATION N/A 03/31/2023    Procedure: BLADDER INSTILLATION- gemcitabine;  Surgeon: Malen Gauze, MD;  Location: [facility] ORS;  Service: Urology;  Laterality: N/A;   CHEST  TUBE INSERTION Left 08/16/2017    Procedure: INSERTION PLEURAL DRAINAGE CATHETER;  Surgeon: Kerin Perna, MD;  Location[facility] OR;  Service: Thoracic;  Laterality: Left;   COLONOSCOPY N/A 07/23/2015    Procedure: COLONOSCOPY;  Surgeon: Corbin Ade, MD;  Location: [facility];  Service: Endoscopy;  Laterality: N/A;  10:30 Am   COLONOSCOPY N/A 08/27/2020    Procedure: COLONOSCOPY;  Surgeon: Corbin Ade, MD;  Location: [facility];  Service: Endoscopy;  Laterality: N/A;  10:30am   CORONARY ARTERY BYPASS GRAFT N/A 06/27/2017    Procedure: CORONARY ARTERY BYPASS GRAFTING (CABG) x 2  WITH ENDOSCOPIC HARVESTING OF RIGHT SAPHENOUS VEIN;  Surgeon: Kerin Perna, MD;  Location[facility] OR;  Service: Open Heart Surgery;  Laterality: N/A;   ESOPHAGOGASTRODUODENOSCOPY (EGD) WITH PROPOFOL N/A 01/04/2022    Procedure: ESOPHAGOGASTRODUODENOSCOPY (EGD) WITH PROPOFOL;  Surgeon: Corbin Ade, MD;  Location: [facility];   Service: Endoscopy;  Laterality: N/A;  12:15 PM, pt knows to arrive at 7:15   IR THORACENTESIS ASP PLEURAL SPACE W/IMG GUIDE   08/08/2017   LEFT HEART CATH AND CORONARY ANGIOGRAPHY N/A 06/14/2017    Procedure: LEFT HEART CATH AND CORONARY ANGIOGRAPHY;  Surgeon: Marykay Lex, MD;  Location: [facility] INVASIVE CV LAB;  Service: Cardiovascular;  Laterality: N/A;   MALONEY DILATION N/A 01/04/2022    Procedure: Elease Hashimoto DILATION;  Surgeon: Corbin Ade, MD;  Location: [facility];  Service: Endoscopy;  Laterality: N/A;   POLYPECTOMY   08/27/2020    Procedure: POLYPECTOMY;  Surgeon: Corbin Ade, MD;  [facility]: [facility];  Service: Endoscopy;;  cecal x2   REMOVAL OF PLEURAL DRAINAGE CATHETER Left 10/13/2017    Procedure: REMOVAL OF PLEURAL DRAINAGE CATHETER;  Surgeon: Kerin Perna, MD;  Location: [facility] OR;  Service: Thoracic;  Laterality: Left;   Right hand surgery       Right knee arthroscopy       TEE WITHOUT CARDIOVERSION N/A 06/27/2017    Procedure: TRANSESOPHAGEAL ECHOCARDIOGRAM (TEE);  Surgeon: Donata Clay, Theron Arista, MD;  Location: [facility] OR;  Service: Open Heart Surgery;  Laterality: N/A;   TRANSURETHRAL RESECTION OF BLADDER TUMOR N/A 03/31/2023    Procedure: TRANSURETHRAL RESECTION OF BLADDER TUMOR (TURBT);  Surgeon: Malen Gauze, MD;  Location: [facility];  Service: Urology;  Laterality: N/A;  pt will arrive between 6 - 6:15             Family History  Problem Relation Age of Onset   Alzheimer's disease Mother     Arthritis Sister     Heart attack Brother     Hypercholesterolemia Sister     Liver disease Sister          Social History         Socioeconomic History   Marital status: Married      Spouse name: Not on file   Number of children: Not on file   Years of education: Not on file   Highest education level: Not on file  Occupational History   Occupation: Retired  Tobacco Use   Smoking status: Former      Current packs/day: 0.00      Average packs/day: 0.5 packs/day for  30.0 years (15.0 ttl pk-yrs)      Types: Cigarettes      Start date: 02/15/1958      Quit date: 02/16/1988      Years since quitting: 35.1   Smokeless tobacco: Former  Types: Snuff   Tobacco comments:      USING NICORETTE GUM TO TRY AND STOP USING SMOKELESS TOBACCO  Vaping Use   Vaping status: Never Used  Substance and Sexual Activity   Alcohol use: No   Drug use: No   Sexual activity: Not Currently  Other Topics Concern   Not on file  Social History Narrative   Not on file    Social Drivers of Health        Financial Resource Strain: Not on file  Food Insecurity: No Food Insecurity (03/31/2023)    Hunger Vital Sign     Worried About Running Out of Food in the Last Year: Never true     Ran Out of Food in the Last Year: Never true  Transportation Needs: No Transportation Needs (03/31/2023)    PRAPARE - Therapist, art (Medical): No     Lack of Transportation (Non-Medical): No  Physical Activity: Not on file  Stress: Not on file  Social Connections: Unknown (03/31/2023)    Social Connection and Isolation Panel [NHANES]     Frequency of Communication with Friends and Family: Twice a week     Frequency of Social Gatherings with Friends and Family: Twice a week     Attends Religious Services: Patient unable to answer     Active Member of Clubs or Organizations: No     Attends Engineer, structural: More than 4 times per year     Marital Status: Married  Catering manager Violence: Not At Risk (03/31/2023)    Humiliation, Afraid, Rape, and Kick questionnaire     Fear of Current or Ex-Partner: No     Emotionally Abused: No     Physically Abused: No     Sexually Abused: No      SUBJECTIVE   Review of Systems Constitutional: Patient denies any unintentional weight loss or change in strength lntegumentary: Patient denies any rashes or pruritus Cardiovascular: Patient denies chest pain or syncope Respiratory: Patient denies shortness of  breath Gastrointestinal: Patient denies nausea, vomiting, constipation, or diarrhea Musculoskeletal: Patient denies muscle cramps or weakness Neurologic: Patient denies convulsions or seizures Allergic/Immunologic: Patient denies recent allergic reaction(s) Hematologic/Lymphatic: Patient denies bleeding tendencies Endocrine: Patient denies heat/cold intolerance   GU: As per HPI.   OBJECTIVE    Vitals:    04/07/23 0929  BP: 111/66  Pulse: 93  Temp: 97.6 F (36.4 C)    There is no height or weight on file to calculate BMI.   Physical Examination Constitutional: No obvious distress; patient is non-toxic appearing  Cardiovascular: No visible lower extremity edema.  Respiratory: The patient does not have audible wheezing/stridor; respirations do not appear labored  Gastrointestinal: Abdomen non-distended Musculoskeletal: Normal ROM of UEs  Skin: No obvious rashes/open sores  Neurologic: CN 2-12 grossly intact Psychiatric: Answered questions appropriately with normal affect  Hematologic/Lymphatic/Immunologic: No obvious bruises or sites of spontaneous bleeding     ASSESSMENT Malignant neoplasm of urinary bladder, unspecified site (HCC) - Plan: Bladder Voiding Trial, ciprofloxacin (CIPRO) tablet 500 mg, Ambulatory Referral For Surgery Scheduling   Postop check - Plan: Bladder Voiding Trial, ciprofloxacin (CIPRO) tablet 500 mg   We reviewed the operative procedures and findings. Patient was advised that based on pathology results (high grade tumor) Dr. Ronne Binning said patient needs repeat TURBT in 4-6 weeks to ensure he got it all. Surgery request submitted.    Passed voiding trial. Foley catheter to remain out.    Patient  verbalized understanding of and agreement with current plan. All questions were answered.   PLAN The risks/benefits/alternatives to transurethral resection of a bladder tumor was explained to the patient and he understands and wishes to proceed with surgery

## 2023-05-12 NOTE — Anesthesia Preprocedure Evaluation (Signed)
 Anesthesia Evaluation  Patient identified by MRN, date of birth, ID band Patient awake    Reviewed: Allergy & Precautions, H&P , NPO status , Patient's Chart, lab work & pertinent test results, reviewed documented beta blocker date and time   Airway Mallampati: II  TM Distance: >3 FB Neck ROM: full    Dental no notable dental hx. (+) Dental Advisory Given, Teeth Intact   Pulmonary sleep apnea , former smoker   Pulmonary exam normal breath sounds clear to auscultation       Cardiovascular Exercise Tolerance: Good hypertension, + angina  + CAD and + Past MI  Normal cardiovascular exam+ dysrhythmias Atrial Fibrillation  Rhythm:regular Rate:Normal     Neuro/Psych negative neurological ROS  negative psych ROS   GI/Hepatic Neg liver ROS,GERD  ,,  Endo/Other  negative endocrine ROS    Renal/GU negative Renal ROS  negative genitourinary   Musculoskeletal  (+) Arthritis , Osteoarthritis,    Abdominal   Peds  Hematology negative hematology ROS (+)   Anesthesia Other Findings   Reproductive/Obstetrics negative OB ROS                             Anesthesia Physical Anesthesia Plan  ASA: 3  Anesthesia Plan: General and General LMA   Post-op Pain Management: Minimal or no pain anticipated   Induction: Intravenous  PONV Risk Score and Plan: Propofol infusion  Airway Management Planned: Nasal Cannula and Natural Airway  Additional Equipment: None  Intra-op Plan:   Post-operative Plan:   Informed Consent: I have reviewed the patients History and Physical, chart, labs and discussed the procedure including the risks, benefits and alternatives for the proposed anesthesia with the patient or authorized representative who has indicated his/her understanding and acceptance.     Dental Advisory Given  Plan Discussed with: CRNA  Anesthesia Plan Comments:        Anesthesia Quick  Evaluation

## 2023-05-12 NOTE — Progress Notes (Signed)
 Bag placed to drain urine.  Red brick color urine returned.

## 2023-05-12 NOTE — Transfer of Care (Signed)
 Immediate Anesthesia Transfer of Care Note  Patient: SYLVAN SOOKDEO  Procedure(s) Performed: CYSTOSCOPY (Urethra) TURBT (TRANSURETHRAL RESECTION OF BLADDER TUMOR) (Bladder) INSTILLATION, BLADDER (Bladder)  Patient Location: PACU  Anesthesia Type:General  Level of Consciousness: awake, alert , and oriented  Airway & Oxygen Therapy: Patient Spontanous Breathing  Post-op Assessment: Report given to RN and Post -op Vital signs reviewed and stable  Post vital signs: Reviewed and stable  Last Vitals:  Vitals Value Taken Time  BP 134/92 05/12/23 1124  Temp 36.6 C 05/12/23 1124  Pulse 84 05/12/23 1125  Resp 13 05/12/23 1125  SpO2 96 % 05/12/23 1125  Vitals shown include unfiled device data.  Last Pain:  Vitals:   05/12/23 0916  TempSrc: Oral  PainSc: 0-No pain      Patients Stated Pain Goal: 8 (05/12/23 0916)  Complications: No notable events documented.

## 2023-05-12 NOTE — Anesthesia Postprocedure Evaluation (Signed)
 Anesthesia Post Note  Patient: Chase Guerrero  Procedure(s) Performed: CYSTOSCOPY (Urethra) TURBT (TRANSURETHRAL RESECTION OF BLADDER TUMOR) (Bladder) INSTILLATION, BLADDER (Bladder)  Patient location during evaluation: PACU Anesthesia Type: General Level of consciousness: awake and alert Pain management: pain level controlled Vital Signs Assessment: post-procedure vital signs reviewed and stable Respiratory status: spontaneous breathing, nonlabored ventilation, respiratory function stable and patient connected to nasal cannula oxygen Cardiovascular status: blood pressure returned to baseline and stable Postop Assessment: no apparent nausea or vomiting Anesthetic complications: no   There were no known notable events for this encounter.   Last Vitals:  Vitals:   05/12/23 1230 05/12/23 1245  BP: 121/89 129/64  Pulse: 77 74  Resp: 11 12  Temp: 36.6 C 36.6 C  SpO2: 98% 99%    Last Pain:  Vitals:   05/12/23 1230  TempSrc:   PainSc: 0-No pain                 [name]

## 2023-05-12 NOTE — Anesthesia Procedure Notes (Signed)
 Procedure Name: Intubation Date/Time: 05/12/2023 10:43 AM  Performed by: Jeanette Caprice, CRNAPre-anesthesia Checklist: Patient identified, Emergency Drugs available, Suction available and Patient being monitored Patient Re-evaluated:Patient Re-evaluated prior to induction Oxygen Delivery Method: Circle system utilized Preoxygenation: Pre-oxygenation with 100% oxygen Induction Type: IV induction Ventilation: Mask ventilation without difficulty Laryngoscope Size: Mac and 3 Grade View: Grade II Tube type: Oral Tube size: 8.0 mm Number of attempts: 1 Airway Equipment and Method: Stylet and Oral airway Placement Confirmation: ETT inserted through vocal cords under direct vision, positive ETCO2 and breath sounds checked- equal and bilateral Secured at: 22 cm Tube secured with: Tape Dental Injury: Teeth and Oropharynx as per pre-operative assessment

## 2023-05-12 NOTE — Op Note (Signed)
.  Preoperative diagnosis: bladder tumor  Postoperative diagnosis: Same  Procedure: 1 cystoscopy 2. Transurethral resection of bladder tumor, medium 3. Instillation of bladder chemotherapy agent  Attending: Cleda Mccreedy  Anesthesia: General  Estimated blood loss: Minimal  Drains: 22 French foley  Specimens: bladder tumor  Antibiotics: ancef  Findings: 3cm sessile dome tumor. Ureteral orifices in normal anatomic location.   Indications: Patient is a 78 year old male/male with a history of high grade bladder cancer here for repeat resection.  After discussing treatment options, they decided proceed with transurethral resection of a bladder tumor.  Procedure in detail: The patient was brought to the operating room and a brief timeout was done to ensure correct patient, correct procedure, correct site.  General anesthesia was administered patient was placed in dorsal lithotomy position.  Their genitalia was then prepped and draped in usual sterile fashion.  A rigid 22 French cystoscope was passed in the urethra and the bladder.  Bladder was inspected and we noted  a 3cm bladder tumor at the dome  the ureteral orifices were in the normal orthotopic locations.  Using the bipolar resectoscope we removed the bladder tumor down to the base. A subsequent muscle deep biopsy was then taken. Hemostasis was then obtained with electrocautery. We then removed the bladder tumor chips and sent them for pathology. We then re-inspected the bladder and found no residula bleeding.  the bladder was then drained, a 22 French foley was placed and 2g of gemcitabine was instilled into the bladder. this concluded the procedure which was well tolerated by patient.  Complications: None  Condition: Stable, extubated, transferred to PACU  Plan: Patient is to have the gemcitabine drained in 1 hour. He will then be discharged home and followup in 5 days for foley catheter removal and pathology discussion.

## 2023-05-12 NOTE — Discharge Instructions (Signed)

## 2023-05-13 ENCOUNTER — Ambulatory Visit

## 2023-05-13 ENCOUNTER — Telehealth: Payer: Self-pay

## 2023-05-13 ENCOUNTER — Encounter (HOSPITAL_COMMUNITY): Payer: Self-pay | Admitting: Urology

## 2023-05-13 DIAGNOSIS — Z466 Encounter for fitting and adjustment of urinary device: Secondary | ICD-10-CM | POA: Diagnosis not present

## 2023-05-13 NOTE — Telephone Encounter (Signed)
 Patient called and state's that his cath was connected correctly and anchor placement was on the wrong leg. Patient is made aware to come in for nurse visited for cath correction and placement. Patient voiced understanding.

## 2023-05-13 NOTE — Progress Notes (Signed)
 Patient seen today to have his leg bag adjusted Cath balloon was deflated 24cc of sterile water removed and cath repositioned into proper position then balloon re inflated w/ 30cc of sterile water and cath anchor placed

## 2023-05-16 LAB — SURGICAL PATHOLOGY

## 2023-05-19 ENCOUNTER — Ambulatory Visit (INDEPENDENT_AMBULATORY_CARE_PROVIDER_SITE_OTHER): Admitting: Urology

## 2023-05-19 ENCOUNTER — Encounter: Payer: Self-pay | Admitting: Urology

## 2023-05-19 VITALS — BP 120/73 | HR 73

## 2023-05-19 DIAGNOSIS — Z09 Encounter for follow-up examination after completed treatment for conditions other than malignant neoplasm: Secondary | ICD-10-CM | POA: Diagnosis not present

## 2023-05-19 DIAGNOSIS — D090 Carcinoma in situ of bladder: Secondary | ICD-10-CM | POA: Diagnosis not present

## 2023-05-19 DIAGNOSIS — C679 Malignant neoplasm of bladder, unspecified: Secondary | ICD-10-CM

## 2023-05-19 MED ORDER — CIPROFLOXACIN HCL 500 MG PO TABS
500.0000 mg | ORAL_TABLET | Freq: Once | ORAL | Status: AC
Start: 2023-05-19 — End: 2023-05-19
  Administered 2023-05-19: 500 mg via ORAL

## 2023-05-19 NOTE — Progress Notes (Signed)
 Name: Chase Guerrero DOB: [birth year] MRN: [id number]  Diagnoses: Post-operative state  HPI: Chase Guerrero presents post-operatively.  GU History includes: 1. Bladder cancer. 2. BPH with LUTS (frequency, urgency, weak stream). - Taking Uroxatral 10 mg nightly and Proscar 5 mg daily. 3. Elevated PSA with negative biopsy x2 in the past.   Today He presents s/p the following procedures by Dr. Ronne Binning on 05/12/2023:  Preoperative diagnosis: bladder tumor   Postoperative diagnosis: Same   Procedure:  1. cystoscopy 2. Transurethral resection of bladder tumor, medium 3. Instillation of bladder chemotherapy agent  Pathology:  A. 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  He denies gross hematuria.  He denies acute flank pain, abdominal pain, fevers, nausea, or vomiting.  Medications: Current Outpatient Medications  Medication Sig Dispense Refill   acetaminophen (TYLENOL) 500 MG tablet Take 1,000 mg by mouth every 6 (six) hours as needed for moderate pain.     alfuzosin (UROXATRAL) 10 MG 24 hr tablet Take 1 tablet (10 mg total) by mouth at bedtime. 30 tablet 11   aspirin EC 81 MG tablet Take 81 mg by mouth every other day.     Cannabidiol POWD Apply 1 application topically 3 (three) times daily as needed (pain.). CBD cream     cycloSPORINE (RESTASIS) 0.05 % ophthalmic emulsion Apply to eye.     ezetimibe (ZETIA) 10 MG tablet Take 10 mg by mouth daily.     finasteride (PROSCAR) 5 MG tablet Take 5 mg by mouth in the morning.     hypromellose (GENTEAL) 0.3 % GEL ophthalmic ointment Apply to eye.     lisinopril (ZESTRIL) 2.5 MG tablet TAKE ONE TABLET (2.5MG  TOTAL) BY MOUTH DAILY 30 tablet 6   nitroGLYCERIN (NITROSTAT) 0.4 MG SL tablet Place 1 tablet (0.4 mg total) under the tongue every 5 (five) minutes x 3  doses as needed for chest pain. 25 tablet 3   Propylene Glycol (SYSTANE COMPLETE) 0.6 % SOLN Place 1 drop into both eyes 3 (three) times daily.     rosuvastatin (CRESTOR) 40 MG tablet TAKE ONE TABLET (40MG  TOTAL) BY MOUTH DAILY 30 tablet 3   triamcinolone cream (KENALOG) 0.1 % SMARTSIG:2 Topical Twice Daily     No current facility-administered medications for this visit.    Allergies: No Known Allergies  Past Medical History:  Diagnosis Date   Arthritis    Atrial fibrillation (HCC)    Cancer (HCC)    skin   Coronary artery disease    Enlarged prostate    GERD (gastroesophageal reflux disease)    Hypertension    Myocardial infarction (HCC) 2019   mild   Pleural effusion    Sleep apnea    not wearing  cpap  not worn in 9-10 yrs   Past Surgical History:  Procedure Laterality Date   BIOPSY  01/04/2022   Procedure: BIOPSY;  Surgeon: Corbin Ade, MD;  [facility];  Service: Endoscopy;;   BLADDER INSTILLATION N/A 03/31/2023   Procedure: BLADDER INSTILLATION- gemcitabine;  Surgeon: Malen Gauze, MD;  Location: [facility];  Service: Urology;  Laterality: N/A;   BLADDER INSTILLATION N/A 05/12/2023   Procedure: INSTILLATION, BLADDER;  Surgeon: Malen Gauze, MD;  Location: [facility];  Service: Urology;  Laterality: N/A;  Gemcitabine   CHEST TUBE INSERTION Left 08/16/2017   Procedure:  INSERTION PLEURAL DRAINAGE CATHETER;  Surgeon: Kerin Perna, MD;  Location: [facility] OR;  Service: Thoracic;  Laterality: Left;   COLONOSCOPY N/A 07/23/2015   Procedure: COLONOSCOPY;  Surgeon: Corbin Ade, MD;  [facility];  Service: Endoscopy;  Laterality: N/A;  10:30 Am   COLONOSCOPY N/A 08/27/2020   Procedure: COLONOSCOPY;  Surgeon: Corbin Ade, MD;  [facility]: [facility];  Service: Endoscopy;  Laterality: N/A;  10:30am   CORONARY ARTERY BYPASS GRAFT N/A 06/27/2017   Procedure: CORONARY ARTERY BYPASS GRAFTING (CABG) x 2  WITH ENDOSCOPIC HARVESTING OF RIGHT SAPHENOUS  VEIN;  Surgeon: Kerin Perna, MD;  Location: [facility] OR;  Service: Open Heart Surgery;  Laterality: N/A;   CYSTOSCOPY N/A 05/12/2023   Procedure: CYSTOSCOPY;  Surgeon: Malen Gauze, MD;  Location: [facility];  Service: Urology;  Laterality: N/A;   ESOPHAGOGASTRODUODENOSCOPY (EGD) WITH PROPOFOL N/A 01/04/2022   Procedure: ESOPHAGOGASTRODUODENOSCOPY (EGD) WITH PROPOFOL;  Surgeon: Corbin Ade, MD;  [facility];  Service: Endoscopy;  Laterality: N/A;  12:15 PM, pt knows to arrive at 7:15   IR THORACENTESIS ASP PLEURAL SPACE W/IMG GUIDE  08/08/2017   LEFT HEART CATH AND CORONARY ANGIOGRAPHY N/A 06/14/2017   Procedure: LEFT HEART CATH AND CORONARY ANGIOGRAPHY;  Surgeon: Marykay Lex, MD;  Location: [facility] INVASIVE CV LAB;  Service: Cardiovascular;  Laterality: N/A;   MALONEY DILATION N/A 01/04/2022   Procedure: Elease Hashimoto DILATION;  Surgeon: Corbin Ade, MD;  [facility];  Service: Endoscopy;  Laterality: N/A;   POLYPECTOMY  08/27/2020   Procedure: POLYPECTOMY;  Surgeon: Corbin Ade, MD;  [facility];  Service: Endoscopy;;  cecal x2   REMOVAL OF PLEURAL DRAINAGE CATHETER Left 10/13/2017   Procedure: REMOVAL OF PLEURAL DRAINAGE CATHETER;  Surgeon: Kerin Perna, MD;  Location: [facility] OR;  Service: Thoracic;  Laterality: Left;   Right hand surgery     Right knee arthroscopy     TEE WITHOUT CARDIOVERSION N/A 06/27/2017   Procedure: TRANSESOPHAGEAL ECHOCARDIOGRAM (TEE);  Surgeon: Donata Clay, Theron Arista, MD;  Location: [facility] OR;  Service: Open Heart Surgery;  Laterality: N/A;   TRANSURETHRAL RESECTION OF BLADDER TUMOR N/A 03/31/2023   Procedure: TRANSURETHRAL RESECTION OF BLADDER TUMOR (TURBT);  Surgeon: Malen Gauze, MD;  Location: [facility];  Service: Urology;  Laterality: N/A;  pt will arrive between 6 - 6:15   TRANSURETHRAL RESECTION OF BLADDER TUMOR N/A 05/12/2023   Procedure: TURBT (TRANSURETHRAL RESECTION OF BLADDER TUMOR);  Surgeon: Malen Gauze, MD;  Location: [facility];  Service: Urology;  Laterality: N/A;   Family History  Problem Relation Age of Onset   Alzheimer's disease Mother    Arthritis Sister    Heart attack Brother    Hypercholesterolemia Sister    Liver disease Sister    Social History   Socioeconomic History   Marital status: Married    Spouse name: Not on file   Number of children: Not on file   Years of education: Not on file   Highest education level: Not on file  Occupational History   Occupation: Retired  Tobacco Use   Smoking status: Former    Current packs/day: 0.00    Average packs/day: 0.5 packs/day for 30.0 years (15.0 ttl pk-yrs)    Types: Cigarettes    Start date: 02/15/1958    Quit date: 02/16/1988    Years since quitting: 35.2   Smokeless tobacco: Former    Types: Snuff   Tobacco comments:  USING NICORETTE GUM TO TRY AND STOP USING SMOKELESS TOBACCO  Vaping Use   Vaping status: Never Used  Substance and Sexual Activity   Alcohol use: No   Drug use: No   Sexual activity: Not Currently  Other Topics Concern   Not on file  Social History Narrative   Not on file   Social Drivers of Health   Financial Resource Strain: Not on file  Food Insecurity: No Food Insecurity (03/31/2023)   Hunger Vital Sign    Worried About Running Out of Food in the Last Year: Never true    Ran Out of Food in the Last Year: Never true  Transportation Needs: No Transportation Needs (03/31/2023)   PRAPARE - Administrator, Civil Service (Medical): No    Lack of Transportation (Non-Medical): No  Physical Activity: Not on file  Stress: Not on file  Social Connections: Unknown (03/31/2023)   Social Connection and Isolation Panel [NHANES]    Frequency of Communication with Friends and Family: Twice a week    Frequency of Social Gatherings with Friends and Family: Twice a week    Attends Religious Services: Patient unable to answer    Active Member of Clubs or Organizations: No    Attends Engineer, structural:  More than 4 times per year    Marital Status: Married  Catering manager Violence: Not At Risk (03/31/2023)   Humiliation, Afraid, Rape, and Kick questionnaire    Fear of Current or Ex-Partner: No    Emotionally Abused: No    Physically Abused: No    Sexually Abused: No    SUBJECTIVE  Review of Systems Constitutional: Patient denies any unintentional weight loss or change in strength lntegumentary: Patient denies any rashes or pruritus Cardiovascular: Patient denies chest pain or syncope Respiratory: Patient denies shortness of breath Gastrointestinal: As per HPI Musculoskeletal: Patient denies muscle cramps or weakness Neurologic: Patient denies convulsions or seizures Allergic/Immunologic: Patient denies recent allergic reaction(s) Hematologic/Lymphatic: Patient denies bleeding tendencies Endocrine: Patient denies heat/cold intolerance  GU: As per HPI.  OBJECTIVE Vitals:   05/19/23 0837  BP: 120/73  Pulse: 73   There is no height or weight on file to calculate BMI.  Physical Examination Constitutional: No obvious distress; patient is non-toxic appearing  Cardiovascular: No visible lower extremity edema.  Respiratory: The patient does not have audible wheezing/stridor; respirations do not appear labored  Gastrointestinal: Abdomen non-distended Musculoskeletal: Normal ROM of UEs  Skin: No obvious rashes/open sores  Neurologic: CN 2-12 grossly intact Psychiatric: Answered questions appropriately with normal affect  Hematologic/Lymphatic/Immunologic: No obvious bruises or sites of spontaneous bleeding  ASSESSMENT Malignant neoplasm of urinary bladder, unspecified site (HCC) - Plan: Bladder Voiding Trial, ciprofloxacin (CIPRO) tablet 500 mg  Postop check  We reviewed the operative procedures and findings.  Passed voiding trial; catheter discontinued.  Will plan for follow up 3 months for surveillance cystoscopy with Dr. Ronne Binning or sooner if needed. Patient verbalized  understanding of and agreement with current plan. All questions were answered.  PLAN Advised the following: Foley catheter discontinued. Continue Uroxatral 10 mg nightly and Proscar 5 mg daily. Return in about 3 months (around 08/18/2023) for surveillance cystoscopy with Dr. Ronne Binning.   Orders Placed This Encounter  Procedures   Bladder Voiding Trial    It has been explained that the patient is to follow regularly with their PCP in addition to all other providers involved in their care and to follow instructions provided by these respective offices. Patient advised to  contact urology clinic if any urologic-pertaining questions, concerns, new symptoms or problems arise in the interim period.  There are no Patient Instructions on file for this visit.  Electronically signed by:  Donnita Falls, MSN, FNP-C, CUNP 05/19/2023 9:18 AM

## 2023-05-19 NOTE — Progress Notes (Signed)
 Fill and Pull Catheter Removal  Patient is present today for a catheter removal.  280 ml of sterile water was instilled into the bladder when the patient felt the urge to urinate. 26 ml of water was then drained from the balloon.  A 20 FR foley 3 way cath was removed from the bladder no complications were noted .  Foley catheter intact and time of removal. Patient as then given some time to void on their own.  Patient can void  225 ml on their own after some time.  Patient tolerated well.  One oral prophylactic antibiotic given per MD orders  Performed by: Kennyth Lose, CMA  Follow up/ Additional notes: Keep follow up

## 2023-05-26 DIAGNOSIS — S233XXA Sprain of ligaments of thoracic spine, initial encounter: Secondary | ICD-10-CM | POA: Diagnosis not present

## 2023-05-26 DIAGNOSIS — S338XXA Sprain of other parts of lumbar spine and pelvis, initial encounter: Secondary | ICD-10-CM | POA: Diagnosis not present

## 2023-05-26 DIAGNOSIS — S134XXA Sprain of ligaments of cervical spine, initial encounter: Secondary | ICD-10-CM | POA: Diagnosis not present

## 2023-05-26 DIAGNOSIS — M9903 Segmental and somatic dysfunction of lumbar region: Secondary | ICD-10-CM | POA: Diagnosis not present

## 2023-05-26 DIAGNOSIS — M9902 Segmental and somatic dysfunction of thoracic region: Secondary | ICD-10-CM | POA: Diagnosis not present

## 2023-05-26 DIAGNOSIS — M9901 Segmental and somatic dysfunction of cervical region: Secondary | ICD-10-CM | POA: Diagnosis not present

## 2023-05-27 ENCOUNTER — Telehealth: Payer: Self-pay | Admitting: Urology

## 2023-05-27 NOTE — Telephone Encounter (Signed)
 FYI and advise please

## 2023-05-27 NOTE — Telephone Encounter (Signed)
 He had tumors removed about a month ago and started passing blood yesterday. His frequency has increased and out put has decreased. No pain, no fever

## 2023-05-30 DIAGNOSIS — S338XXA Sprain of other parts of lumbar spine and pelvis, initial encounter: Secondary | ICD-10-CM | POA: Diagnosis not present

## 2023-05-30 DIAGNOSIS — S233XXA Sprain of ligaments of thoracic spine, initial encounter: Secondary | ICD-10-CM | POA: Diagnosis not present

## 2023-05-30 DIAGNOSIS — M9903 Segmental and somatic dysfunction of lumbar region: Secondary | ICD-10-CM | POA: Diagnosis not present

## 2023-05-30 DIAGNOSIS — S134XXA Sprain of ligaments of cervical spine, initial encounter: Secondary | ICD-10-CM | POA: Diagnosis not present

## 2023-05-30 DIAGNOSIS — M9901 Segmental and somatic dysfunction of cervical region: Secondary | ICD-10-CM | POA: Diagnosis not present

## 2023-05-30 DIAGNOSIS — M9902 Segmental and somatic dysfunction of thoracic region: Secondary | ICD-10-CM | POA: Diagnosis not present

## 2023-05-30 NOTE — Telephone Encounter (Signed)
 Can you please f/u w/ pt and get need information to f/u w/ provider

## 2023-05-30 NOTE — Telephone Encounter (Signed)
 Left message for Mr to call

## 2023-06-01 DIAGNOSIS — M9903 Segmental and somatic dysfunction of lumbar region: Secondary | ICD-10-CM | POA: Diagnosis not present

## 2023-06-01 DIAGNOSIS — M9902 Segmental and somatic dysfunction of thoracic region: Secondary | ICD-10-CM | POA: Diagnosis not present

## 2023-06-01 DIAGNOSIS — S134XXA Sprain of ligaments of cervical spine, initial encounter: Secondary | ICD-10-CM | POA: Diagnosis not present

## 2023-06-01 DIAGNOSIS — S233XXA Sprain of ligaments of thoracic spine, initial encounter: Secondary | ICD-10-CM | POA: Diagnosis not present

## 2023-06-01 DIAGNOSIS — S338XXA Sprain of other parts of lumbar spine and pelvis, initial encounter: Secondary | ICD-10-CM | POA: Diagnosis not present

## 2023-06-01 DIAGNOSIS — M9901 Segmental and somatic dysfunction of cervical region: Secondary | ICD-10-CM | POA: Diagnosis not present

## 2023-06-03 DIAGNOSIS — M9902 Segmental and somatic dysfunction of thoracic region: Secondary | ICD-10-CM | POA: Diagnosis not present

## 2023-06-03 DIAGNOSIS — S233XXA Sprain of ligaments of thoracic spine, initial encounter: Secondary | ICD-10-CM | POA: Diagnosis not present

## 2023-06-03 DIAGNOSIS — S338XXA Sprain of other parts of lumbar spine and pelvis, initial encounter: Secondary | ICD-10-CM | POA: Diagnosis not present

## 2023-06-03 DIAGNOSIS — S134XXA Sprain of ligaments of cervical spine, initial encounter: Secondary | ICD-10-CM | POA: Diagnosis not present

## 2023-06-03 DIAGNOSIS — M9901 Segmental and somatic dysfunction of cervical region: Secondary | ICD-10-CM | POA: Diagnosis not present

## 2023-06-03 DIAGNOSIS — M9903 Segmental and somatic dysfunction of lumbar region: Secondary | ICD-10-CM | POA: Diagnosis not present

## 2023-06-08 DIAGNOSIS — M9901 Segmental and somatic dysfunction of cervical region: Secondary | ICD-10-CM | POA: Diagnosis not present

## 2023-06-08 DIAGNOSIS — S233XXA Sprain of ligaments of thoracic spine, initial encounter: Secondary | ICD-10-CM | POA: Diagnosis not present

## 2023-06-08 DIAGNOSIS — S134XXA Sprain of ligaments of cervical spine, initial encounter: Secondary | ICD-10-CM | POA: Diagnosis not present

## 2023-06-08 DIAGNOSIS — M9903 Segmental and somatic dysfunction of lumbar region: Secondary | ICD-10-CM | POA: Diagnosis not present

## 2023-06-08 DIAGNOSIS — S338XXA Sprain of other parts of lumbar spine and pelvis, initial encounter: Secondary | ICD-10-CM | POA: Diagnosis not present

## 2023-06-08 DIAGNOSIS — M9902 Segmental and somatic dysfunction of thoracic region: Secondary | ICD-10-CM | POA: Diagnosis not present

## 2023-06-15 DIAGNOSIS — S134XXA Sprain of ligaments of cervical spine, initial encounter: Secondary | ICD-10-CM | POA: Diagnosis not present

## 2023-06-15 DIAGNOSIS — M9901 Segmental and somatic dysfunction of cervical region: Secondary | ICD-10-CM | POA: Diagnosis not present

## 2023-06-15 DIAGNOSIS — S338XXA Sprain of other parts of lumbar spine and pelvis, initial encounter: Secondary | ICD-10-CM | POA: Diagnosis not present

## 2023-06-15 DIAGNOSIS — M9903 Segmental and somatic dysfunction of lumbar region: Secondary | ICD-10-CM | POA: Diagnosis not present

## 2023-06-15 DIAGNOSIS — S233XXA Sprain of ligaments of thoracic spine, initial encounter: Secondary | ICD-10-CM | POA: Diagnosis not present

## 2023-06-15 DIAGNOSIS — M9902 Segmental and somatic dysfunction of thoracic region: Secondary | ICD-10-CM | POA: Diagnosis not present

## 2023-06-22 DIAGNOSIS — S134XXA Sprain of ligaments of cervical spine, initial encounter: Secondary | ICD-10-CM | POA: Diagnosis not present

## 2023-06-22 DIAGNOSIS — S338XXA Sprain of other parts of lumbar spine and pelvis, initial encounter: Secondary | ICD-10-CM | POA: Diagnosis not present

## 2023-06-22 DIAGNOSIS — M9901 Segmental and somatic dysfunction of cervical region: Secondary | ICD-10-CM | POA: Diagnosis not present

## 2023-06-22 DIAGNOSIS — M9903 Segmental and somatic dysfunction of lumbar region: Secondary | ICD-10-CM | POA: Diagnosis not present

## 2023-06-22 DIAGNOSIS — M9902 Segmental and somatic dysfunction of thoracic region: Secondary | ICD-10-CM | POA: Diagnosis not present

## 2023-06-22 DIAGNOSIS — S233XXA Sprain of ligaments of thoracic spine, initial encounter: Secondary | ICD-10-CM | POA: Diagnosis not present

## 2023-07-05 DIAGNOSIS — N1831 Chronic kidney disease, stage 3a: Secondary | ICD-10-CM | POA: Diagnosis not present

## 2023-07-05 DIAGNOSIS — M51369 Other intervertebral disc degeneration, lumbar region without mention of lumbar back pain or lower extremity pain: Secondary | ICD-10-CM | POA: Diagnosis not present

## 2023-07-05 DIAGNOSIS — R7303 Prediabetes: Secondary | ICD-10-CM | POA: Diagnosis not present

## 2023-07-05 DIAGNOSIS — N400 Enlarged prostate without lower urinary tract symptoms: Secondary | ICD-10-CM | POA: Diagnosis not present

## 2023-07-05 DIAGNOSIS — Z79899 Other long term (current) drug therapy: Secondary | ICD-10-CM | POA: Diagnosis not present

## 2023-07-05 DIAGNOSIS — E785 Hyperlipidemia, unspecified: Secondary | ICD-10-CM | POA: Diagnosis not present

## 2023-07-05 DIAGNOSIS — Z125 Encounter for screening for malignant neoplasm of prostate: Secondary | ICD-10-CM | POA: Diagnosis not present

## 2023-07-05 DIAGNOSIS — I2510 Atherosclerotic heart disease of native coronary artery without angina pectoris: Secondary | ICD-10-CM | POA: Diagnosis not present

## 2023-07-07 DIAGNOSIS — M9902 Segmental and somatic dysfunction of thoracic region: Secondary | ICD-10-CM | POA: Diagnosis not present

## 2023-07-07 DIAGNOSIS — M9903 Segmental and somatic dysfunction of lumbar region: Secondary | ICD-10-CM | POA: Diagnosis not present

## 2023-07-07 DIAGNOSIS — S134XXA Sprain of ligaments of cervical spine, initial encounter: Secondary | ICD-10-CM | POA: Diagnosis not present

## 2023-07-07 DIAGNOSIS — S233XXA Sprain of ligaments of thoracic spine, initial encounter: Secondary | ICD-10-CM | POA: Diagnosis not present

## 2023-07-07 DIAGNOSIS — S338XXA Sprain of other parts of lumbar spine and pelvis, initial encounter: Secondary | ICD-10-CM | POA: Diagnosis not present

## 2023-07-07 DIAGNOSIS — M9901 Segmental and somatic dysfunction of cervical region: Secondary | ICD-10-CM | POA: Diagnosis not present

## 2023-07-11 DIAGNOSIS — I2510 Atherosclerotic heart disease of native coronary artery without angina pectoris: Secondary | ICD-10-CM | POA: Diagnosis not present

## 2023-07-11 DIAGNOSIS — E785 Hyperlipidemia, unspecified: Secondary | ICD-10-CM | POA: Diagnosis not present

## 2023-07-11 DIAGNOSIS — R7303 Prediabetes: Secondary | ICD-10-CM | POA: Diagnosis not present

## 2023-07-11 DIAGNOSIS — Z8551 Personal history of malignant neoplasm of bladder: Secondary | ICD-10-CM | POA: Diagnosis not present

## 2023-07-11 DIAGNOSIS — Z0001 Encounter for general adult medical examination with abnormal findings: Secondary | ICD-10-CM | POA: Diagnosis not present

## 2023-07-11 DIAGNOSIS — N183 Chronic kidney disease, stage 3 unspecified: Secondary | ICD-10-CM | POA: Diagnosis not present

## 2023-07-11 DIAGNOSIS — R739 Hyperglycemia, unspecified: Secondary | ICD-10-CM | POA: Diagnosis not present

## 2023-07-11 DIAGNOSIS — N401 Enlarged prostate with lower urinary tract symptoms: Secondary | ICD-10-CM | POA: Diagnosis not present

## 2023-07-12 DIAGNOSIS — M9902 Segmental and somatic dysfunction of thoracic region: Secondary | ICD-10-CM | POA: Diagnosis not present

## 2023-07-12 DIAGNOSIS — S134XXA Sprain of ligaments of cervical spine, initial encounter: Secondary | ICD-10-CM | POA: Diagnosis not present

## 2023-07-12 DIAGNOSIS — S338XXA Sprain of other parts of lumbar spine and pelvis, initial encounter: Secondary | ICD-10-CM | POA: Diagnosis not present

## 2023-07-12 DIAGNOSIS — S233XXA Sprain of ligaments of thoracic spine, initial encounter: Secondary | ICD-10-CM | POA: Diagnosis not present

## 2023-07-12 DIAGNOSIS — M9901 Segmental and somatic dysfunction of cervical region: Secondary | ICD-10-CM | POA: Diagnosis not present

## 2023-07-12 DIAGNOSIS — M9903 Segmental and somatic dysfunction of lumbar region: Secondary | ICD-10-CM | POA: Diagnosis not present

## 2023-07-19 DIAGNOSIS — S338XXA Sprain of other parts of lumbar spine and pelvis, initial encounter: Secondary | ICD-10-CM | POA: Diagnosis not present

## 2023-07-19 DIAGNOSIS — S134XXA Sprain of ligaments of cervical spine, initial encounter: Secondary | ICD-10-CM | POA: Diagnosis not present

## 2023-07-19 DIAGNOSIS — M9901 Segmental and somatic dysfunction of cervical region: Secondary | ICD-10-CM | POA: Diagnosis not present

## 2023-07-19 DIAGNOSIS — M9903 Segmental and somatic dysfunction of lumbar region: Secondary | ICD-10-CM | POA: Diagnosis not present

## 2023-07-19 DIAGNOSIS — S233XXA Sprain of ligaments of thoracic spine, initial encounter: Secondary | ICD-10-CM | POA: Diagnosis not present

## 2023-07-19 DIAGNOSIS — M9902 Segmental and somatic dysfunction of thoracic region: Secondary | ICD-10-CM | POA: Diagnosis not present

## 2023-07-26 ENCOUNTER — Telehealth: Payer: Self-pay

## 2023-07-26 DIAGNOSIS — S338XXA Sprain of other parts of lumbar spine and pelvis, initial encounter: Secondary | ICD-10-CM | POA: Diagnosis not present

## 2023-07-26 DIAGNOSIS — S233XXA Sprain of ligaments of thoracic spine, initial encounter: Secondary | ICD-10-CM | POA: Diagnosis not present

## 2023-07-26 DIAGNOSIS — M9901 Segmental and somatic dysfunction of cervical region: Secondary | ICD-10-CM | POA: Diagnosis not present

## 2023-07-26 DIAGNOSIS — S134XXA Sprain of ligaments of cervical spine, initial encounter: Secondary | ICD-10-CM | POA: Diagnosis not present

## 2023-07-26 DIAGNOSIS — M9902 Segmental and somatic dysfunction of thoracic region: Secondary | ICD-10-CM | POA: Diagnosis not present

## 2023-07-26 DIAGNOSIS — M9903 Segmental and somatic dysfunction of lumbar region: Secondary | ICD-10-CM | POA: Diagnosis not present

## 2023-07-26 NOTE — Telephone Encounter (Signed)
 Confirmed additional refills at pharmacy, requested them to refill both lisinopril  and rosuvastatin . Will confirm fills next week.

## 2023-08-02 DIAGNOSIS — S134XXA Sprain of ligaments of cervical spine, initial encounter: Secondary | ICD-10-CM | POA: Diagnosis not present

## 2023-08-02 DIAGNOSIS — M9901 Segmental and somatic dysfunction of cervical region: Secondary | ICD-10-CM | POA: Diagnosis not present

## 2023-08-02 DIAGNOSIS — S233XXA Sprain of ligaments of thoracic spine, initial encounter: Secondary | ICD-10-CM | POA: Diagnosis not present

## 2023-08-02 DIAGNOSIS — M9903 Segmental and somatic dysfunction of lumbar region: Secondary | ICD-10-CM | POA: Diagnosis not present

## 2023-08-02 DIAGNOSIS — S338XXA Sprain of other parts of lumbar spine and pelvis, initial encounter: Secondary | ICD-10-CM | POA: Diagnosis not present

## 2023-08-02 DIAGNOSIS — M9902 Segmental and somatic dysfunction of thoracic region: Secondary | ICD-10-CM | POA: Diagnosis not present

## 2023-08-04 ENCOUNTER — Telehealth: Payer: Self-pay

## 2023-08-04 NOTE — Telephone Encounter (Signed)
 Patient returned call but was not home to verify medication supply. Will call back with confirmation

## 2023-08-09 ENCOUNTER — Telehealth: Payer: Self-pay

## 2023-08-09 DIAGNOSIS — S233XXA Sprain of ligaments of thoracic spine, initial encounter: Secondary | ICD-10-CM | POA: Diagnosis not present

## 2023-08-09 DIAGNOSIS — S338XXA Sprain of other parts of lumbar spine and pelvis, initial encounter: Secondary | ICD-10-CM | POA: Diagnosis not present

## 2023-08-09 DIAGNOSIS — M9903 Segmental and somatic dysfunction of lumbar region: Secondary | ICD-10-CM | POA: Diagnosis not present

## 2023-08-09 DIAGNOSIS — M9902 Segmental and somatic dysfunction of thoracic region: Secondary | ICD-10-CM | POA: Diagnosis not present

## 2023-08-09 DIAGNOSIS — M9901 Segmental and somatic dysfunction of cervical region: Secondary | ICD-10-CM | POA: Diagnosis not present

## 2023-08-09 DIAGNOSIS — S134XXA Sprain of ligaments of cervical spine, initial encounter: Secondary | ICD-10-CM | POA: Diagnosis not present

## 2023-08-09 NOTE — Telephone Encounter (Signed)
 Up to date on rosuvastatin , claims to have supply of lisinopril  but no fills since April. Will review again in July

## 2023-08-11 DIAGNOSIS — M791 Myalgia, unspecified site: Secondary | ICD-10-CM | POA: Diagnosis not present

## 2023-08-11 DIAGNOSIS — M545 Low back pain, unspecified: Secondary | ICD-10-CM | POA: Diagnosis not present

## 2023-08-11 DIAGNOSIS — G8929 Other chronic pain: Secondary | ICD-10-CM | POA: Diagnosis not present

## 2023-08-16 DIAGNOSIS — S233XXA Sprain of ligaments of thoracic spine, initial encounter: Secondary | ICD-10-CM | POA: Diagnosis not present

## 2023-08-16 DIAGNOSIS — S338XXA Sprain of other parts of lumbar spine and pelvis, initial encounter: Secondary | ICD-10-CM | POA: Diagnosis not present

## 2023-08-16 DIAGNOSIS — M9902 Segmental and somatic dysfunction of thoracic region: Secondary | ICD-10-CM | POA: Diagnosis not present

## 2023-08-16 DIAGNOSIS — S134XXA Sprain of ligaments of cervical spine, initial encounter: Secondary | ICD-10-CM | POA: Diagnosis not present

## 2023-08-16 DIAGNOSIS — M9903 Segmental and somatic dysfunction of lumbar region: Secondary | ICD-10-CM | POA: Diagnosis not present

## 2023-08-16 DIAGNOSIS — M9901 Segmental and somatic dysfunction of cervical region: Secondary | ICD-10-CM | POA: Diagnosis not present

## 2023-08-22 ENCOUNTER — Ambulatory Visit: Admitting: Urology

## 2023-08-22 VITALS — BP 114/65 | HR 78

## 2023-08-22 DIAGNOSIS — C679 Malignant neoplasm of bladder, unspecified: Secondary | ICD-10-CM | POA: Diagnosis not present

## 2023-08-22 DIAGNOSIS — Z8551 Personal history of malignant neoplasm of bladder: Secondary | ICD-10-CM | POA: Diagnosis not present

## 2023-08-22 LAB — URINALYSIS, ROUTINE W REFLEX MICROSCOPIC
Bilirubin, UA: NEGATIVE
Glucose, UA: NEGATIVE
Ketones, UA: NEGATIVE
Leukocytes,UA: NEGATIVE
Nitrite, UA: NEGATIVE
Specific Gravity, UA: 1.025 (ref 1.005–1.030)
Urobilinogen, Ur: 0.2 mg/dL (ref 0.2–1.0)
pH, UA: 6 (ref 5.0–7.5)

## 2023-08-22 LAB — MICROSCOPIC EXAMINATION: Bacteria, UA: NONE SEEN

## 2023-08-22 MED ORDER — CIPROFLOXACIN HCL 500 MG PO TABS
500.0000 mg | ORAL_TABLET | Freq: Once | ORAL | Status: AC
  Administered 2023-08-22: 500 mg via ORAL

## 2023-08-22 NOTE — Progress Notes (Unsigned)
   08/22/23  CC: followup bladder cancer   HPI: Chase Guerrero is a 78yo here for followup for bladder cancer Blood pressure 114/65, pulse 78. NED. A&Ox3.   No respiratory distress   Abd soft, NT, ND Normal phallus with bilateral descended testicles  Cystoscopy Procedure Note  Patient identification was confirmed, informed consent was obtained, and patient was prepped using Betadine solution.  Lidocaine  jelly was administered per urethral meatus.     Pre-Procedure: - Inspection reveals a normal caliber ureteral meatus.  Procedure: The flexible cystoscope was introduced without difficulty - No urethral strictures/lesions are present. - Enlarged prostate  - Normal bladder neck - Bilateral ureteral orifices identified - Bladder mucosa  reveals no ulcers, tumors, or lesions - No bladder stones - No trabeculation     Post-Procedure: - Patient tolerated the procedure well  Assessment/ Plan: Urine for cytology Followup 3 months for cystoscopy  Belvie Clara, MD

## 2023-08-23 ENCOUNTER — Encounter: Payer: Self-pay | Admitting: Urology

## 2023-08-23 DIAGNOSIS — M9902 Segmental and somatic dysfunction of thoracic region: Secondary | ICD-10-CM | POA: Diagnosis not present

## 2023-08-23 DIAGNOSIS — S134XXA Sprain of ligaments of cervical spine, initial encounter: Secondary | ICD-10-CM | POA: Diagnosis not present

## 2023-08-23 DIAGNOSIS — M9903 Segmental and somatic dysfunction of lumbar region: Secondary | ICD-10-CM | POA: Diagnosis not present

## 2023-08-23 DIAGNOSIS — M9901 Segmental and somatic dysfunction of cervical region: Secondary | ICD-10-CM | POA: Diagnosis not present

## 2023-08-23 DIAGNOSIS — S233XXA Sprain of ligaments of thoracic spine, initial encounter: Secondary | ICD-10-CM | POA: Diagnosis not present

## 2023-08-23 DIAGNOSIS — S338XXA Sprain of other parts of lumbar spine and pelvis, initial encounter: Secondary | ICD-10-CM | POA: Diagnosis not present

## 2023-08-23 LAB — CYTOLOGY, URINE

## 2023-08-23 NOTE — Patient Instructions (Signed)

## 2023-08-25 DIAGNOSIS — M791 Myalgia, unspecified site: Secondary | ICD-10-CM | POA: Diagnosis not present

## 2023-08-30 ENCOUNTER — Ambulatory Visit: Payer: Self-pay | Admitting: Urology

## 2023-08-30 DIAGNOSIS — M9901 Segmental and somatic dysfunction of cervical region: Secondary | ICD-10-CM | POA: Diagnosis not present

## 2023-08-30 DIAGNOSIS — M9902 Segmental and somatic dysfunction of thoracic region: Secondary | ICD-10-CM | POA: Diagnosis not present

## 2023-08-30 DIAGNOSIS — S233XXA Sprain of ligaments of thoracic spine, initial encounter: Secondary | ICD-10-CM | POA: Diagnosis not present

## 2023-08-30 DIAGNOSIS — S134XXA Sprain of ligaments of cervical spine, initial encounter: Secondary | ICD-10-CM | POA: Diagnosis not present

## 2023-08-30 DIAGNOSIS — M9903 Segmental and somatic dysfunction of lumbar region: Secondary | ICD-10-CM | POA: Diagnosis not present

## 2023-08-30 DIAGNOSIS — S338XXA Sprain of other parts of lumbar spine and pelvis, initial encounter: Secondary | ICD-10-CM | POA: Diagnosis not present

## 2023-09-06 ENCOUNTER — Other Ambulatory Visit: Payer: Self-pay | Admitting: Cardiology

## 2023-09-08 ENCOUNTER — Telehealth: Payer: Self-pay

## 2023-09-08 NOTE — Telephone Encounter (Signed)
 Up to date on meds, next review in September

## 2023-09-21 DIAGNOSIS — M47816 Spondylosis without myelopathy or radiculopathy, lumbar region: Secondary | ICD-10-CM | POA: Diagnosis not present

## 2023-09-21 DIAGNOSIS — M533 Sacrococcygeal disorders, not elsewhere classified: Secondary | ICD-10-CM | POA: Diagnosis not present

## 2023-09-28 DIAGNOSIS — M533 Sacrococcygeal disorders, not elsewhere classified: Secondary | ICD-10-CM | POA: Diagnosis not present

## 2023-10-10 HISTORY — PX: CATARACT EXTRACTION: SUR2

## 2023-10-14 DIAGNOSIS — M533 Sacrococcygeal disorders, not elsewhere classified: Secondary | ICD-10-CM | POA: Diagnosis not present

## 2023-10-14 DIAGNOSIS — M47816 Spondylosis without myelopathy or radiculopathy, lumbar region: Secondary | ICD-10-CM | POA: Diagnosis not present

## 2023-10-31 ENCOUNTER — Other Ambulatory Visit: Payer: Self-pay | Admitting: Cardiology

## 2023-11-08 DIAGNOSIS — Z79899 Other long term (current) drug therapy: Secondary | ICD-10-CM | POA: Diagnosis not present

## 2023-11-08 DIAGNOSIS — N1831 Chronic kidney disease, stage 3a: Secondary | ICD-10-CM | POA: Diagnosis not present

## 2023-11-08 DIAGNOSIS — I2510 Atherosclerotic heart disease of native coronary artery without angina pectoris: Secondary | ICD-10-CM | POA: Diagnosis not present

## 2023-11-08 DIAGNOSIS — R7303 Prediabetes: Secondary | ICD-10-CM | POA: Diagnosis not present

## 2023-11-11 ENCOUNTER — Encounter: Payer: Self-pay | Admitting: Cardiology

## 2023-11-11 ENCOUNTER — Ambulatory Visit: Attending: Cardiology | Admitting: Cardiology

## 2023-11-11 VITALS — BP 116/68 | HR 69 | Ht 70.0 in | Wt 183.4 lb

## 2023-11-11 DIAGNOSIS — E782 Mixed hyperlipidemia: Secondary | ICD-10-CM

## 2023-11-11 NOTE — Patient Instructions (Signed)
 Medication Instructions:  Your physician recommends that you continue on your current medications as directed. Please refer to the Current Medication list given to you today.  *If you need a refill on your cardiac medications before your next appointment, please call your pharmacy*  Lab Work: Fasting Lipid Panel If you have labs (blood work) drawn today and your tests are completely normal, you will receive your results only by: MyChart Message (if you have MyChart) OR A paper copy in the mail If you have any lab test that is abnormal or we need to change your treatment, we will call you to review the results.  Testing/Procedures: None  Follow-Up: At [facility], you and your health needs are our priority.  As part of our continuing mission to provide you with exceptional heart care, our providers are all part of one team.  This team includes your primary Cardiologist (physician) and Advanced Practice Providers or APPs (Physician Assistants and Nurse Practitioners) who all work together to provide you with the care you need, when you need it.  Your next appointment:   6 month(s)  Provider:   You may see Dina Rich, MD or one of the following Advanced Practice Providers on your designated Care Team:   Randall An, PA-C  [place], [place] Jacolyn Reedy, [place]     We recommend signing up for the patient portal called "MyChart".  Sign up information is provided on this After Visit Summary.  MyChart is used to connect with patients for Virtual Visits (Telemedicine).  Patients are able to view lab/test results, encounter notes, upcoming appointments, etc.  Non-urgent messages can be sent to your provider as well.   To learn more about what you can do with MyChart, go to [email or web address].   Other Instructions

## 2023-11-11 NOTE — Progress Notes (Unsigned)
 Clinical Summary Chase Guerrero is a 78 y.o.male seen today for follow up of the following medical problems.    1. CAD - seen in 05/2017 for chest pain - 05/2017 high risk nuclear stress test as reported below, referred for cath - 06/2017 cath distal LM to LAD 100%, LVEF 35-45% by LV gram, referred for CABG   - 06/27/17 CABG with LIMA-LAD, SVG-diag -07/2017 echo LVEF 60-65%.      - he is off metoprolol  due to low bp's and orthostatic symptoms  -EKG today shows NSR - no chest pains, no SOB/DOE - compliant with meds  - no chest pains, no SOB/DOE - compliant with meds   2. Hyperlipidemia   06/2022 TC 110 TG 104 HDL 35 LDL 55 - he is on crestor  40mg  daily, zetia  10mg         Past Medical History:  Diagnosis Date   Arthritis    Atrial fibrillation (HCC)    Cancer (HCC)    skin   Coronary artery disease    Enlarged prostate    GERD (gastroesophageal reflux disease)    Hypertension    Myocardial infarction (HCC) 2019   mild   Pleural effusion    Sleep apnea    not wearing  cpap  not worn in 9-10 yrs     No Known Allergies   Current Outpatient Medications  Medication Sig Dispense Refill   acetaminophen  (TYLENOL ) 500 MG tablet Take 1,000 mg by mouth every 6 (six) hours as needed for moderate pain.     alfuzosin  (UROXATRAL ) 10 MG 24 hr tablet Take 1 tablet (10 mg total) by mouth at bedtime. 30 tablet 11   aspirin  EC 81 MG tablet Take 81 mg by mouth every other day.     Cannabidiol POWD Apply 1 application topically 3 (three) times daily as needed (pain.). CBD cream     cycloSPORINE (RESTASIS) 0.05 % ophthalmic emulsion Apply to eye.     ezetimibe  (ZETIA ) 10 MG tablet Take 10 mg by mouth daily.     finasteride  (PROSCAR ) 5 MG tablet Take 5 mg by mouth in the morning.     hypromellose (GENTEAL) 0.3 % GEL ophthalmic ointment Apply to eye.     lisinopril  (ZESTRIL ) 2.5 MG tablet TAKE ONE TABLET (2.5MG  TOTAL) BY MOUTH DAILY 30 tablet 4   nitroGLYCERIN  (NITROSTAT ) 0.4 MG SL  tablet Place 1 tablet (0.4 mg total) under the tongue every 5 (five) minutes x 3 doses as needed for chest pain. 25 tablet 3   Propylene Glycol (SYSTANE COMPLETE) 0.6 % SOLN Place 1 drop into both eyes 3 (three) times daily.     rosuvastatin  (CRESTOR ) 40 MG tablet TAKE ONE TABLET (40MG  TOTAL) BY MOUTH DAILY 30 tablet 3   triamcinolone cream (KENALOG) 0.1 % SMARTSIG:2 Topical Twice Daily     No current facility-administered medications for this visit.     Past Surgical History:  Procedure Laterality Date   BIOPSY  01/04/2022   Procedure: BIOPSY;  Surgeon: Shaaron Lamar HERO, MD;  [facility];  Service: Endoscopy;;   BLADDER INSTILLATION N/A 03/31/2023   Procedure: BLADDER INSTILLATION- gemcitabine ;  Surgeon: Sherrilee Belvie CROME, MD;  Location: [facility];  Service: Urology;  Laterality: N/A;   BLADDER INSTILLATION N/A 05/12/2023   Procedure: INSTILLATION, BLADDER;  Surgeon: Sherrilee Belvie CROME, MD;  Location: [facility];  Service: Urology;  Laterality: N/A;  Gemcitabine    CHEST TUBE INSERTION Left 08/16/2017   Procedure: INSERTION PLEURAL DRAINAGE CATHETER;  Surgeon: Fleeta Ochoa, Maude, MD;  Location: [facility] OR;  Service: Thoracic;  Laterality: Left;   COLONOSCOPY N/A 07/23/2015   Procedure: COLONOSCOPY;  Surgeon: Lamar CHRISTELLA Hollingshead, MD;  Location: [facility];  Service: Endoscopy;  Laterality: N/A;  10:30 Am   COLONOSCOPY N/A 08/27/2020   Procedure: COLONOSCOPY;  Surgeon: Hollingshead Lamar CHRISTELLA, MD;  [facility]: [facility];  Service: Endoscopy;  Laterality: N/A;  10:30am   CORONARY ARTERY BYPASS GRAFT N/A 06/27/2017   Procedure: CORONARY ARTERY BYPASS GRAFTING (CABG) x 2  WITH ENDOSCOPIC HARVESTING OF RIGHT SAPHENOUS VEIN;  Surgeon: Fleeta Ochoa Maude, MD;  Location[facility] OR;  Service: Open Heart Surgery;  Laterality: N/A;   CYSTOSCOPY N/A 05/12/2023   Procedure: CYSTOSCOPY;  Surgeon: Sherrilee Belvie CROME, MD;  Location: [facility];  Service: Urology;  Laterality: N/A;   ESOPHAGOGASTRODUODENOSCOPY (EGD) WITH PROPOFOL  N/A  01/04/2022   Procedure: ESOPHAGOGASTRODUODENOSCOPY (EGD) WITH PROPOFOL ;  Surgeon: Hollingshead Lamar CHRISTELLA, MD;  [facility]: [facility];  Service: Endoscopy;  Laterality: N/A;  12:15 PM, pt knows to arrive at 7:15   IR THORACENTESIS ASP PLEURAL SPACE W/IMG GUIDE  08/08/2017   LEFT HEART CATH AND CORONARY ANGIOGRAPHY N/A 06/14/2017   Procedure: LEFT HEART CATH AND CORONARY ANGIOGRAPHY;  Surgeon: Anner Alm ORN, MD;  Location: [facility] INVASIVE CV LAB;  Service: Cardiovascular;  Laterality: N/A;   MALONEY DILATION N/A 01/04/2022   Procedure: AGAPITO DILATION;  Surgeon: Hollingshead Lamar CHRISTELLA, MD;  [facility];  Service: Endoscopy;  Laterality: N/A;   POLYPECTOMY  08/27/2020   Procedure: POLYPECTOMY;  Surgeon: Hollingshead Lamar CHRISTELLA, MD;  [facility];  Service: Endoscopy;;  cecal x2   REMOVAL OF PLEURAL DRAINAGE CATHETER Left 10/13/2017   Procedure: REMOVAL OF PLEURAL DRAINAGE CATHETER;  Surgeon: Fleeta Ochoa Maude, MD;  Location: [facility] OR;  Service: Thoracic;  Laterality: Left;   Right hand surgery     Right knee arthroscopy     TEE WITHOUT CARDIOVERSION N/A 06/27/2017   Procedure: TRANSESOPHAGEAL ECHOCARDIOGRAM (TEE);  Surgeon: Fleeta Ochoa, Maude, MD;  Location: [facility] OR;  Service: Open Heart Surgery;  Laterality: N/A;   TRANSURETHRAL RESECTION OF BLADDER TUMOR N/A 03/31/2023   Procedure: TRANSURETHRAL RESECTION OF BLADDER TUMOR (TURBT);  Surgeon: Sherrilee Belvie CROME, MD;  Location: [facility];  Service: Urology;  Laterality: N/A;  pt will arrive between 6 - 6:15   TRANSURETHRAL RESECTION OF BLADDER TUMOR N/A 05/12/2023   Procedure: TURBT (TRANSURETHRAL RESECTION OF BLADDER TUMOR);  Surgeon: Sherrilee Belvie CROME, MD;  Location: [facility];  Service: Urology;  Laterality: N/A;     No Known Allergies    Family History  Problem Relation Age of Onset   Alzheimer's disease Mother    Arthritis Sister    Heart attack Brother    Hypercholesterolemia Sister    Liver disease Sister      Social History Mr. Ashmead reports  that he quit smoking about 35 years ago. His smoking use included cigarettes. He started smoking about 65 years ago. He has a 15 pack-year smoking history. He has quit using smokeless tobacco.  His smokeless tobacco use included snuff. Mr. Rahal reports no history of alcohol use.   Review of Systems CONSTITUTIONAL: No weight loss, fever, chills, weakness or fatigue.  HEENT: Eyes: No visual loss, blurred vision, double vision or yellow sclerae.No hearing loss, sneezing, congestion, runny nose or sore throat.  SKIN: No rash or itching.  CARDIOVASCULAR:  RESPIRATORY: No shortness of breath, cough or sputum.  GASTROINTESTINAL: No anorexia,  nausea, vomiting or diarrhea. No abdominal pain or blood.  GENITOURINARY: No burning on urination, no polyuria NEUROLOGICAL: No headache, dizziness, syncope, paralysis, ataxia, numbness or tingling in the extremities. No change in bowel or bladder control.  MUSCULOSKELETAL: No muscle, back pain, joint pain or stiffness.  LYMPHATICS: No enlarged nodes. No history of splenectomy.  PSYCHIATRIC: No history of depression or anxiety.  ENDOCRINOLOGIC: No reports of sweating, cold or heat intolerance. No polyuria or polydipsia.  Chase Guerrero   Physical Examination Today's Vitals   11/11/23 1140  BP: 116/68  Pulse: 69  SpO2: 93%  Weight: 183 lb 6.4 oz (83.2 kg)  Height: 5' 10 (1.778 m)   Body mass index is 26.32 kg/m.  Gen: resting comfortably, no acute distress HEENT: no scleral icterus, pupils equal round and reactive, no palptable cervical adenopathy,  CV: RRR, no m/rg, no jvd Resp: Clear to auscultation bilaterally GI: abdomen is soft, non-tender, non-distended, normal bowel sounds, no hepatosplenomegaly MSK: extremities are warm, no edema.  Skin: warm, no rash Neuro:  no focal deficits Psych: appropriate affect   Diagnostic Studies 05/2017 nuclear stress Blood pressure demonstrated a hypertensive response to exercise. 1 mm horizontal ST segment  depressions in leads II, III, and aVF in recovery with nonspecific horizontal ST segment depressions in leads V5 and V6. There was excessive artifact with stress which limits interpretation. Defect 1: There is a large defect of moderate severity present in the mid anterior, mid anteroseptal, mid inferoseptal, apical anterior, apical septal and apical inferior location. Findings consistent with a large degree of ischemia. This is a high risk study. Nuclear stress EF: 50%.     07/2017 echo Study Conclusions   - Left ventricle: The cavity size was normal. Wall thickness was   normal. Systolic function was normal. The estimated ejection   fraction was in the range of 60% to 65%. Wall motion was normal;   there were no regional wall motion abnormalities. Left   ventricular diastolic function parameters were normal. Doppler   parameters are consistent with indeterminate ventricular filling   pressure. - Aortic valve: There was mild regurgitation. - Mitral valve: There was mild regurgitation. - Right ventricle: Systolic function appeared grossly normal (RV   poorly visualized, I doubt accuracy of TAPSE). - Pericardium, extracardiac: There was a left pleural effusion.    Assessment and Plan  1. CAD - medical therapy limited by prior orthostatic symptoms and low bp's - no symptoms, continue current meds   2. Hyperlipidemia -LDL has trended up by 06/2023 labs, repeat lipid panel - pending results may need to consider pcsk9i, already on crestor  40 and zetia  10.    F/u 6 months      Dorn PHEBE Ross, M.D.,

## 2023-11-15 DIAGNOSIS — E782 Mixed hyperlipidemia: Secondary | ICD-10-CM | POA: Diagnosis not present

## 2023-11-16 LAB — LIPID PANEL
Chol/HDL Ratio: 3.1 ratio (ref 0.0–5.0)
Cholesterol, Total: 131 mg/dL (ref 100–199)
HDL: 42 mg/dL (ref >39)
LDL Chol Calc (NIH): 70 mg/dL (ref 0–99)
Triglycerides: 105 mg/dL (ref 0–149)
VLDL Cholesterol Cal: 19 mg/dL (ref 5–40)

## 2023-11-23 ENCOUNTER — Ambulatory Visit: Admitting: Urology

## 2023-11-23 VITALS — BP 116/68 | HR 86

## 2023-11-23 DIAGNOSIS — C679 Malignant neoplasm of bladder, unspecified: Secondary | ICD-10-CM | POA: Diagnosis not present

## 2023-11-23 LAB — URINALYSIS, ROUTINE W REFLEX MICROSCOPIC
Bilirubin, UA: NEGATIVE
Glucose, UA: NEGATIVE
Ketones, UA: NEGATIVE
Leukocytes,UA: NEGATIVE
Nitrite, UA: NEGATIVE
Specific Gravity, UA: 1.025 (ref 1.005–1.030)
Urobilinogen, Ur: 1 mg/dL (ref 0.2–1.0)
pH, UA: 6 (ref 5.0–7.5)

## 2023-11-23 LAB — MICROSCOPIC EXAMINATION: Bacteria, UA: NONE SEEN

## 2023-11-23 MED ORDER — CIPROFLOXACIN HCL 500 MG PO TABS
500.0000 mg | ORAL_TABLET | Freq: Once | ORAL | Status: AC
  Administered 2023-11-23: 500 mg via ORAL

## 2023-11-23 NOTE — Progress Notes (Signed)
   11/23/23  CC: followup bladder cancer   HPI: Chase Guerrero is a 78yo here for cystoscopy for bladder cancer diagnosed in 05/2023 Blood pressure 116/68, pulse 86. NED. A&Ox3.   No respiratory distress   Abd soft, NT, ND Normal phallus with bilateral descended testicles  Cystoscopy Procedure Note  Patient identification was confirmed, informed consent was obtained, and patient was prepped using Betadine solution.  Lidocaine  jelly was administered per urethral meatus.     Pre-Procedure: - Inspection reveals a normal caliber ureteral meatus.  Procedure: The flexible cystoscope was introduced without difficulty - No urethral strictures/lesions are present. - Enlarged prostate  - Normal bladder neck - Bilateral ureteral orifices identified - Bladder mucosa  reveals no ulcers, tumors, or lesions - No bladder stones - No trabeculation     Post-Procedure: - Patient tolerated the procedure well  Assessment/ Plan: Urine for cytology. Followup 3 months for cystoscopy  No follow-ups on file.  Chase Clara, MD

## 2023-11-25 LAB — CYTOLOGY, URINE

## 2023-11-29 ENCOUNTER — Ambulatory Visit: Payer: Self-pay

## 2023-11-29 ENCOUNTER — Encounter: Payer: Self-pay | Admitting: Urology

## 2023-11-29 NOTE — Patient Instructions (Signed)

## 2023-11-29 NOTE — Telephone Encounter (Signed)
-----   Message from Belvie Clara sent at 11/29/2023 10:07 AM EDT ----- Please get a FISH ----- Message ----- From: Rebecka Memos Lab Results In Sent: 11/23/2023  10:36 AM EDT To: Belvie LITTIE Clara, MD

## 2023-11-29 NOTE — Telephone Encounter (Signed)
 Patient called with no answer. Detailed message left.  My-chart message sent.

## 2023-11-30 ENCOUNTER — Telehealth: Payer: Self-pay

## 2023-11-30 DIAGNOSIS — N183 Chronic kidney disease, stage 3 unspecified: Secondary | ICD-10-CM | POA: Diagnosis not present

## 2023-11-30 DIAGNOSIS — I2510 Atherosclerotic heart disease of native coronary artery without angina pectoris: Secondary | ICD-10-CM | POA: Diagnosis not present

## 2023-11-30 DIAGNOSIS — Z23 Encounter for immunization: Secondary | ICD-10-CM | POA: Diagnosis not present

## 2023-11-30 DIAGNOSIS — G4700 Insomnia, unspecified: Secondary | ICD-10-CM | POA: Diagnosis not present

## 2023-11-30 NOTE — Telephone Encounter (Signed)
 Tried calling pt with no answer, lvm detailing the Urine cytology results.

## 2023-11-30 NOTE — Telephone Encounter (Signed)
 Patient left a voice message regarding results from urine analysis.    Please advise.  Call:  [phone]

## 2023-12-05 ENCOUNTER — Ambulatory Visit

## 2023-12-05 ENCOUNTER — Other Ambulatory Visit: Payer: Self-pay

## 2023-12-05 DIAGNOSIS — R82998 Other abnormal findings in urine: Secondary | ICD-10-CM | POA: Diagnosis not present

## 2023-12-05 DIAGNOSIS — R8289 Other abnormal findings on cytological and histological examination of urine: Secondary | ICD-10-CM | POA: Diagnosis not present

## 2023-12-05 NOTE — Telephone Encounter (Signed)
 Patient returned call to the office. Appointment scheduled for today. Patient made aware of Dr. Sherrilee orders.

## 2023-12-05 NOTE — Addendum Note (Signed)
 Addended by: GRETTA MASTERS R on: 12/05/2023 12:01 PM   Modules accepted: Orders

## 2023-12-06 ENCOUNTER — Telehealth: Payer: Self-pay

## 2023-12-06 NOTE — Telephone Encounter (Signed)
 See other encounter

## 2023-12-06 NOTE — Telephone Encounter (Signed)
 Pick up tracking # [id number] for urine cytology

## 2023-12-09 LAB — UROV MD:CYTOLOGY W/REFLEX FISH ATYPICAL CYTOLOGY

## 2023-12-13 ENCOUNTER — Ambulatory Visit: Payer: Self-pay

## 2024-01-13 DIAGNOSIS — L821 Other seborrheic keratosis: Secondary | ICD-10-CM | POA: Diagnosis not present

## 2024-01-13 DIAGNOSIS — Z08 Encounter for follow-up examination after completed treatment for malignant neoplasm: Secondary | ICD-10-CM | POA: Diagnosis not present

## 2024-01-13 DIAGNOSIS — L853 Xerosis cutis: Secondary | ICD-10-CM | POA: Diagnosis not present

## 2024-01-13 DIAGNOSIS — D1801 Hemangioma of skin and subcutaneous tissue: Secondary | ICD-10-CM | POA: Diagnosis not present

## 2024-01-13 DIAGNOSIS — Z85828 Personal history of other malignant neoplasm of skin: Secondary | ICD-10-CM | POA: Diagnosis not present

## 2024-01-13 DIAGNOSIS — L578 Other skin changes due to chronic exposure to nonionizing radiation: Secondary | ICD-10-CM | POA: Diagnosis not present

## 2024-01-25 ENCOUNTER — Ambulatory Visit: Payer: Self-pay | Admitting: Cardiology

## 2024-02-14 ENCOUNTER — Other Ambulatory Visit: Payer: Self-pay | Admitting: Cardiology

## 2024-02-29 ENCOUNTER — Ambulatory Visit: Admitting: Urology

## 2024-02-29 VITALS — BP 115/67 | HR 80

## 2024-02-29 DIAGNOSIS — Z08 Encounter for follow-up examination after completed treatment for malignant neoplasm: Secondary | ICD-10-CM

## 2024-02-29 DIAGNOSIS — Z8551 Personal history of malignant neoplasm of bladder: Secondary | ICD-10-CM | POA: Diagnosis not present

## 2024-02-29 DIAGNOSIS — C679 Malignant neoplasm of bladder, unspecified: Secondary | ICD-10-CM

## 2024-02-29 LAB — MICROSCOPIC EXAMINATION: Bacteria, UA: NONE SEEN

## 2024-02-29 LAB — URINALYSIS, ROUTINE W REFLEX MICROSCOPIC
Bilirubin, UA: NEGATIVE
Glucose, UA: NEGATIVE
Ketones, UA: NEGATIVE
Leukocytes,UA: NEGATIVE
Nitrite, UA: NEGATIVE
Specific Gravity, UA: 1.02 (ref 1.005–1.030)
Urobilinogen, Ur: 0.2 mg/dL (ref 0.2–1.0)
pH, UA: 6 (ref 5.0–7.5)

## 2024-02-29 MED ORDER — CIPROFLOXACIN HCL 500 MG PO TABS
500.0000 mg | ORAL_TABLET | Freq: Once | ORAL | Status: AC
  Administered 2024-02-29: 500 mg via ORAL

## 2024-02-29 NOTE — Progress Notes (Unsigned)
" ° °  02/29/24  CC: followup bladder cancer   HPI: Mr Chase Guerrero is a 78yo here for cystoscopy for history of bladder cancer Blood pressure 115/67, pulse 80. NED. A&Ox3.   No respiratory distress   Abd soft, NT, ND Normal phallus with bilateral descended testicles  Cystoscopy Procedure Note  Patient identification was confirmed, informed consent was obtained, and patient was prepped using Betadine solution.  Lidocaine  jelly was administered per urethral meatus.     Pre-Procedure: - Inspection reveals a normal caliber ureteral meatus.  Procedure: The flexible cystoscope was introduced without difficulty - No urethral strictures/lesions are present. - Enlarged prostate  - Normal bladder neck - Bilateral ureteral orifices identified - Bladder mucosa  reveals no ulcers, tumors, or lesions - No bladder stones - No trabeculation     Post-Procedure: - Patient tolerated the procedure well  Assessment/ Plan: Follouwp 3 months for cystoscopy  No follow-ups on file.  Belvie Clara, MD  "

## 2024-03-01 ENCOUNTER — Encounter: Payer: Self-pay | Admitting: Urology

## 2024-03-01 NOTE — Patient Instructions (Signed)

## 2024-04-17 ENCOUNTER — Ambulatory Visit: Admitting: Family Medicine

## 2024-05-07 ENCOUNTER — Ambulatory Visit: Admitting: Cardiology

## 2024-05-30 ENCOUNTER — Other Ambulatory Visit: Admitting: Urology
